# Patient Record
Sex: Female | Born: 1945
Health system: Southern US, Community
[De-identification: ages and names within clinical notes are randomized; demographics above are authoritative.]

## PROBLEM LIST (undated history)

## (undated) DIAGNOSIS — N279 Small kidney, unspecified: Secondary | ICD-10-CM

## (undated) DIAGNOSIS — G8929 Other chronic pain: Secondary | ICD-10-CM

## (undated) DIAGNOSIS — R51 Headache: Secondary | ICD-10-CM

## (undated) DIAGNOSIS — G629 Polyneuropathy, unspecified: Secondary | ICD-10-CM

## (undated) DIAGNOSIS — IMO0002 Reserved for concepts with insufficient information to code with codable children: Secondary | ICD-10-CM

## (undated) DIAGNOSIS — E785 Hyperlipidemia, unspecified: Secondary | ICD-10-CM

## (undated) DIAGNOSIS — K279 Peptic ulcer, site unspecified, unspecified as acute or chronic, without hemorrhage or perforation: Secondary | ICD-10-CM

## (undated) DIAGNOSIS — R110 Nausea: Secondary | ICD-10-CM

## (undated) DIAGNOSIS — R7989 Other specified abnormal findings of blood chemistry: Secondary | ICD-10-CM

## (undated) DIAGNOSIS — R945 Abnormal results of liver function studies: Secondary | ICD-10-CM

## (undated) DIAGNOSIS — J309 Allergic rhinitis, unspecified: Secondary | ICD-10-CM

## (undated) DIAGNOSIS — K589 Irritable bowel syndrome without diarrhea: Secondary | ICD-10-CM

## (undated) DIAGNOSIS — M351 Other overlap syndromes: Secondary | ICD-10-CM

## (undated) DIAGNOSIS — E559 Vitamin D deficiency, unspecified: Secondary | ICD-10-CM

## (undated) DIAGNOSIS — M1990 Unspecified osteoarthritis, unspecified site: Secondary | ICD-10-CM

## (undated) DIAGNOSIS — K219 Gastro-esophageal reflux disease without esophagitis: Secondary | ICD-10-CM

## (undated) DIAGNOSIS — E875 Hyperkalemia: Secondary | ICD-10-CM

## (undated) DIAGNOSIS — R519 Headache, unspecified: Secondary | ICD-10-CM

## (undated) DIAGNOSIS — F419 Anxiety disorder, unspecified: Secondary | ICD-10-CM

## (undated) DIAGNOSIS — I7300 Raynaud's syndrome without gangrene: Secondary | ICD-10-CM

## (undated) DIAGNOSIS — M329 Systemic lupus erythematosus, unspecified: Secondary | ICD-10-CM

## (undated) DIAGNOSIS — N189 Chronic kidney disease, unspecified: Secondary | ICD-10-CM

## (undated) DIAGNOSIS — I10 Essential (primary) hypertension: Secondary | ICD-10-CM

## (undated) DIAGNOSIS — D649 Anemia, unspecified: Secondary | ICD-10-CM

## (undated) DIAGNOSIS — M549 Dorsalgia, unspecified: Secondary | ICD-10-CM

## (undated) HISTORY — DX: Other chronic pain: G89.29

## (undated) HISTORY — DX: Reserved for concepts with insufficient information to code with codable children: IMO0002

## (undated) HISTORY — DX: Other specified abnormal findings of blood chemistry: R79.89

## (undated) HISTORY — DX: Anemia, unspecified: D64.9

## (undated) HISTORY — PX: TONSILLECTOMY: SUR1361

## (undated) HISTORY — PX: FOOT SURGERY: SHX648

## (undated) HISTORY — DX: Vitamin D deficiency, unspecified: E55.9

## (undated) HISTORY — DX: Dorsalgia, unspecified: M54.9

## (undated) HISTORY — DX: Irritable bowel syndrome, unspecified: K58.9

## (undated) HISTORY — DX: Raynaud's syndrome without gangrene: I73.00

## (undated) HISTORY — DX: Headache: R51

## (undated) HISTORY — DX: Small kidney, unspecified: N27.9

## (undated) HISTORY — DX: Systemic lupus erythematosus, unspecified: M32.9

## (undated) HISTORY — DX: Abnormal results of liver function studies: R94.5

## (undated) HISTORY — DX: Allergic rhinitis, unspecified: J30.9

## (undated) HISTORY — PX: WRIST GANGLION EXCISION: SUR520

## (undated) HISTORY — DX: Hypercalcemia: E83.52

## (undated) HISTORY — DX: Unspecified osteoarthritis, unspecified site: M19.90

## (undated) HISTORY — DX: Headache, unspecified: R51.9

## (undated) HISTORY — DX: Chronic kidney disease, unspecified: N18.9

## (undated) HISTORY — DX: Hyperkalemia: E87.5

## (undated) HISTORY — DX: Peptic ulcer, site unspecified, unspecified as acute or chronic, without hemorrhage or perforation: K27.9

## (undated) HISTORY — DX: Other overlap syndromes: M35.1

## (undated) HISTORY — PX: LIPOMA EXCISION: SHX5283

## (undated) HISTORY — DX: Gastro-esophageal reflux disease without esophagitis: K21.9

## (undated) HISTORY — DX: Hyperlipidemia, unspecified: E78.5

## (undated) HISTORY — DX: Essential (primary) hypertension: I10

## (undated) HISTORY — DX: Polyneuropathy, unspecified: G62.9

---

## 1970-01-06 HISTORY — PX: APPENDECTOMY: SHX54

## 1973-01-06 HISTORY — PX: ABDOMINAL HYSTERECTOMY: SHX81

## 1998-03-10 ENCOUNTER — Ambulatory Visit (HOSPITAL_COMMUNITY): Admission: RE | Admit: 1998-03-10 | Discharge: 1998-03-10 | Payer: Self-pay | Admitting: Family Medicine

## 1998-03-10 ENCOUNTER — Encounter: Payer: Self-pay | Admitting: Family Medicine

## 1999-01-01 ENCOUNTER — Ambulatory Visit (HOSPITAL_COMMUNITY): Admission: RE | Admit: 1999-01-01 | Discharge: 1999-01-01 | Payer: Self-pay | Admitting: *Deleted

## 1999-01-04 ENCOUNTER — Ambulatory Visit (HOSPITAL_COMMUNITY): Admission: RE | Admit: 1999-01-04 | Discharge: 1999-01-04 | Payer: Self-pay | Admitting: *Deleted

## 1999-01-24 ENCOUNTER — Encounter: Payer: Self-pay | Admitting: *Deleted

## 1999-01-24 ENCOUNTER — Ambulatory Visit (HOSPITAL_COMMUNITY): Admission: RE | Admit: 1999-01-24 | Discharge: 1999-01-24 | Payer: Self-pay | Admitting: *Deleted

## 1999-01-25 ENCOUNTER — Ambulatory Visit (HOSPITAL_COMMUNITY): Admission: RE | Admit: 1999-01-25 | Discharge: 1999-01-25 | Payer: Self-pay | Admitting: Obstetrics

## 1999-01-25 ENCOUNTER — Encounter: Payer: Self-pay | Admitting: *Deleted

## 1999-01-31 ENCOUNTER — Encounter: Payer: Self-pay | Admitting: *Deleted

## 1999-01-31 ENCOUNTER — Ambulatory Visit (HOSPITAL_COMMUNITY): Admission: RE | Admit: 1999-01-31 | Discharge: 1999-01-31 | Payer: Self-pay | Admitting: *Deleted

## 2000-02-27 ENCOUNTER — Other Ambulatory Visit: Admission: RE | Admit: 2000-02-27 | Discharge: 2000-02-27 | Payer: Self-pay | Admitting: Family Medicine

## 2000-03-09 ENCOUNTER — Ambulatory Visit (HOSPITAL_COMMUNITY): Admission: RE | Admit: 2000-03-09 | Discharge: 2000-03-09 | Payer: Self-pay | Admitting: Family Medicine

## 2000-03-09 ENCOUNTER — Encounter: Payer: Self-pay | Admitting: Family Medicine

## 2001-03-11 ENCOUNTER — Encounter: Admission: RE | Admit: 2001-03-11 | Discharge: 2001-03-11 | Payer: Self-pay | Admitting: *Deleted

## 2001-03-11 ENCOUNTER — Encounter: Payer: Self-pay | Admitting: *Deleted

## 2001-03-12 ENCOUNTER — Ambulatory Visit (HOSPITAL_COMMUNITY): Admission: RE | Admit: 2001-03-12 | Discharge: 2001-03-12 | Payer: Self-pay | Admitting: *Deleted

## 2001-03-12 ENCOUNTER — Encounter: Payer: Self-pay | Admitting: *Deleted

## 2001-04-22 ENCOUNTER — Ambulatory Visit (HOSPITAL_COMMUNITY): Admission: RE | Admit: 2001-04-22 | Discharge: 2001-04-22 | Payer: Self-pay | Admitting: *Deleted

## 2001-08-05 ENCOUNTER — Encounter: Payer: Self-pay | Admitting: Obstetrics

## 2001-08-05 ENCOUNTER — Ambulatory Visit (HOSPITAL_COMMUNITY): Admission: RE | Admit: 2001-08-05 | Discharge: 2001-08-05 | Payer: Self-pay | Admitting: Obstetrics

## 2002-03-01 ENCOUNTER — Ambulatory Visit (HOSPITAL_COMMUNITY): Admission: RE | Admit: 2002-03-01 | Discharge: 2002-03-01 | Payer: Self-pay | Admitting: *Deleted

## 2002-03-01 ENCOUNTER — Encounter: Payer: Self-pay | Admitting: *Deleted

## 2002-03-09 ENCOUNTER — Encounter: Payer: Self-pay | Admitting: Emergency Medicine

## 2002-03-09 ENCOUNTER — Emergency Department (HOSPITAL_COMMUNITY): Admission: EM | Admit: 2002-03-09 | Discharge: 2002-03-10 | Payer: Self-pay | Admitting: Emergency Medicine

## 2002-04-14 ENCOUNTER — Encounter: Payer: Self-pay | Admitting: *Deleted

## 2002-04-14 ENCOUNTER — Ambulatory Visit (HOSPITAL_COMMUNITY): Admission: RE | Admit: 2002-04-14 | Discharge: 2002-04-14 | Payer: Self-pay | Admitting: *Deleted

## 2002-05-20 ENCOUNTER — Encounter (HOSPITAL_COMMUNITY): Admission: RE | Admit: 2002-05-20 | Discharge: 2002-06-14 | Payer: Self-pay | Admitting: *Deleted

## 2003-01-06 ENCOUNTER — Ambulatory Visit (HOSPITAL_COMMUNITY): Admission: RE | Admit: 2003-01-06 | Discharge: 2003-01-06 | Payer: Self-pay

## 2003-02-28 ENCOUNTER — Ambulatory Visit (HOSPITAL_COMMUNITY): Admission: RE | Admit: 2003-02-28 | Discharge: 2003-02-28 | Payer: Self-pay | Admitting: *Deleted

## 2004-06-29 ENCOUNTER — Encounter: Admission: RE | Admit: 2004-06-29 | Discharge: 2004-06-29 | Payer: Self-pay | Admitting: Neurosurgery

## 2004-11-12 ENCOUNTER — Encounter: Admission: RE | Admit: 2004-11-12 | Discharge: 2004-11-12 | Payer: Self-pay | Admitting: Family Medicine

## 2004-12-06 ENCOUNTER — Ambulatory Visit (HOSPITAL_COMMUNITY): Admission: RE | Admit: 2004-12-06 | Discharge: 2004-12-06 | Payer: Self-pay | Admitting: Family Medicine

## 2005-09-15 ENCOUNTER — Encounter: Admission: RE | Admit: 2005-09-15 | Discharge: 2005-09-15 | Payer: Self-pay | Admitting: Gastroenterology

## 2006-03-26 ENCOUNTER — Ambulatory Visit (HOSPITAL_COMMUNITY): Admission: RE | Admit: 2006-03-26 | Discharge: 2006-03-26 | Payer: Self-pay | Admitting: Family Medicine

## 2006-09-14 ENCOUNTER — Encounter: Admission: RE | Admit: 2006-09-14 | Discharge: 2006-09-14 | Payer: Self-pay | Admitting: Gastroenterology

## 2006-09-17 ENCOUNTER — Encounter: Admission: RE | Admit: 2006-09-17 | Discharge: 2006-09-17 | Payer: Self-pay | Admitting: Family Medicine

## 2006-10-06 ENCOUNTER — Ambulatory Visit: Payer: Self-pay | Admitting: Oncology

## 2007-01-12 ENCOUNTER — Ambulatory Visit: Payer: Self-pay | Admitting: Gastroenterology

## 2007-01-14 ENCOUNTER — Ambulatory Visit (HOSPITAL_COMMUNITY): Admission: RE | Admit: 2007-01-14 | Discharge: 2007-01-14 | Payer: Self-pay | Admitting: Gastroenterology

## 2007-01-22 ENCOUNTER — Ambulatory Visit: Payer: Self-pay | Admitting: Gastroenterology

## 2007-01-22 LAB — CONVERTED CEMR LAB: Tissue Transglutaminase Ab, IgA: 0.5 units (ref <7)

## 2007-02-02 ENCOUNTER — Ambulatory Visit: Payer: Self-pay | Admitting: Gastroenterology

## 2007-03-23 ENCOUNTER — Ambulatory Visit: Payer: Self-pay | Admitting: Gastroenterology

## 2007-05-04 ENCOUNTER — Ambulatory Visit: Payer: Self-pay | Admitting: Gastroenterology

## 2007-06-15 ENCOUNTER — Encounter
Admission: RE | Admit: 2007-06-15 | Discharge: 2007-06-15 | Payer: Self-pay | Admitting: Physical Medicine and Rehabilitation

## 2007-08-13 ENCOUNTER — Encounter: Admission: RE | Admit: 2007-08-13 | Discharge: 2007-08-13 | Payer: Self-pay | Admitting: Internal Medicine

## 2007-11-10 ENCOUNTER — Ambulatory Visit (HOSPITAL_COMMUNITY): Admission: RE | Admit: 2007-11-10 | Discharge: 2007-11-10 | Payer: Self-pay | Admitting: Internal Medicine

## 2008-06-28 ENCOUNTER — Encounter: Admission: RE | Admit: 2008-06-28 | Discharge: 2008-09-26 | Payer: Self-pay | Admitting: Internal Medicine

## 2008-08-22 ENCOUNTER — Ambulatory Visit: Payer: Self-pay | Admitting: Gastroenterology

## 2008-08-22 DIAGNOSIS — R933 Abnormal findings on diagnostic imaging of other parts of digestive tract: Secondary | ICD-10-CM

## 2008-09-21 ENCOUNTER — Ambulatory Visit (HOSPITAL_COMMUNITY): Admission: RE | Admit: 2008-09-21 | Discharge: 2008-09-21 | Payer: Self-pay | Admitting: Gastroenterology

## 2008-09-21 ENCOUNTER — Ambulatory Visit: Payer: Self-pay | Admitting: Gastroenterology

## 2008-12-07 ENCOUNTER — Ambulatory Visit (HOSPITAL_COMMUNITY): Admission: RE | Admit: 2008-12-07 | Discharge: 2008-12-07 | Payer: Self-pay | Admitting: Obstetrics and Gynecology

## 2009-08-16 ENCOUNTER — Encounter: Admission: RE | Admit: 2009-08-16 | Discharge: 2009-08-16 | Payer: Self-pay | Admitting: Internal Medicine

## 2009-10-02 ENCOUNTER — Encounter: Admission: RE | Admit: 2009-10-02 | Discharge: 2009-10-02 | Payer: Self-pay | Admitting: Unknown Physician Specialty

## 2010-01-26 ENCOUNTER — Encounter: Payer: Self-pay | Admitting: General Practice

## 2010-01-27 ENCOUNTER — Encounter: Payer: Self-pay | Admitting: Gastroenterology

## 2010-03-07 ENCOUNTER — Other Ambulatory Visit (HOSPITAL_COMMUNITY)
Admission: RE | Admit: 2010-03-07 | Discharge: 2010-03-07 | Disposition: A | Discharge status: Home / Self Care | Payer: Medicare Other | Source: Ambulatory Visit | Attending: Internal Medicine | Admitting: Internal Medicine

## 2010-03-07 DIAGNOSIS — Z124 Encounter for screening for malignant neoplasm of cervix: Secondary | ICD-10-CM | POA: Insufficient documentation

## 2010-05-21 NOTE — Assessment & Plan Note (Signed)
[place] OFFICE NOTE   NAME:Holly Jimenez, MENZIES             MRN:          [id number]  DATE:01/12/2007                            DOB:          [birth year]    REFERRING PHYSICIAN:  Naima A. Charlesetta Garibaldi, M.D.   PRIMARY CARE PHYSICIAN:  L. Donnie Coffin, M.D.   REASON FOR REFERRAL:  Dr. Charlesetta Garibaldi asked me to evaluate [name] as a  second opinion for chronic right lower quadrant pain and weight loss.   HISTORY OF PRESENT ILLNESS:  [name] is a pleasant 65 year old woman  who has had at least one to two years of chronic right lower quadrant  discomfort and weight loss.  She believes she has lost 20 pounds in the  past year or so.  She does have alternating constipation with loose  stools and has been on Miral ax for that recently.  She takes the  Miralax on a p.r.n. basis.  She has not seen blood in her stool.  She  has a family history for colon cancer (her sister died of colon cancer),  and has had colorectal cancer screening through Dr. Howell Rucks  recently.  Specifically she had a colonoscopy with Dr. Katherina Mires in  2000.  This was a colonoscopy to the cecum.  He saw no polyps.  She had  an incomplete colonoscopy to 90 cm from the anal version 2007 by Dr.  Howell Rucks.  Following up this, he performed a virtual colonoscopy.  The radiologist that read this favored that lesions seen in the colon  actually represented untagged stool but they could not exclude small  polyps.  He followed that up with a double contrast barium enema and no  large fixed ilium were seen.  No polyps were noted.  She tells me she  had an endoscopy with Dr. Wynetta Emery three to four years ago and believes  that it was normal.   The right lower quadrant paten she has is an achy, constant discomfort  that is sometimes helped with moving her bowels but not regularly.  Eating does not seem to make it better or worse.  The pain can  last for  a week at a time.  She will take antispasmodics around that time and it  does not really seem to help.  She had lab tests done today by Dr.  Marveen Reeks and we will work to get those sent over for our review.  I do  not have any other lab tests.   REVIEW OF SYSTEMS:  An over 20 pound weight loss in the past year.  Otherwise essentially normal and is available on her nurse intake sheet.   PAST MEDICAL HISTORY:  Lupus, chronic renal insufficiency, recently  creatinines in the high 1's.  Hypertension, arthritis, anxiety, chronic  headaches, status post hysterectomy, status post appendectomy.   CURRENT MEDICATIONS:  Plaquenil, hyoscyamine, Accupril, Spironolactone,  prednisone, Megace, Prilosec, Zolpidem, Trazodone, cyclobenzaprine,  Skelaxin, Multi-vitamin, B12, vitamin D, low dose aspirin, iron, Zyrtec,  chlorpromazine as needed, alprazolam three times a day.   ALLERGIES:  SULFA ANTIBIOTICS.   Divorced, lives alone.  Nonsmoker.  Drinks  a glass of wine a week.   FAMILY HISTORY:  Sister with ovarian cancer and with diabetes.  Another  sister with colon cancer.   PHYSICAL EXAMINATION:  VITAL SIGNS:  5'2.  108 pounds.  CONSTITUTIONAL:  Chronically ill appearing.  NEUROLOGICAL:  Alert and oriented x3.  EYES:  Extraocular movements intact.  MOUTH:  Oropharynx moist, no lesions.  NECK:  Supple.  No lymphadenopathy.  CARDIOVASCULAR.  Heart regular rate and rhythm.  LUNGS:  Clear to auscultation bilaterally.  ABDOMEN:  Soft, nontender, nondistended.  Normal bowel sounds.  EXTREMITIES:  No lower extremity edema.  SKIN:  No rashes or lesions are visible on the extremities.   ASSESSMENT/PLAN:  65 year old woman with chronic right lower  quadrant discomfort and weight loss.  She has had two to three colonoscopies in the past seven to eight years,  virtual colonoscopy and a barium enema.  These were for colorectal  cancer screening purposes.  I do not think colonoscopy needs  to be  repeated at this point.  She does have constipation alternating with  diarrhea and I find fiber supplements are usually very effective at  alleviating that, so I have recommended that she begin taking Citrucel,  one scoop daily to see if that can regulate her.  Her right lower  quadrant pains are of unclear etiology.  Plaquenil can cause abdominal  pains and nausea (as numbers four and five side effects).  Perhaps it is  medication related.  She had a virtual colonoscopy less than a year and  a half ago and it did not show any abnormalities in her right lower  quadrant.  I will arrange for her to have a repeat CT scan; however,  given her renal insufficiency, it will have to be without IV contrast so  it will be of somewhat limited value.  That being said, big lesions such  as large neoplastic lesions should still be able to be seen by non-IV  contrast scan.  We will get labs sent over from Dr. Marveen Reeks who  apparently drew some blood work on her today.  I may need to draw  further labs but I will wait to see exactly what she has had recently  before I do more blood work on her.  Another possibility for her weight  loss is her mild uremia.  She has renal insufficiency and perhaps this  is causing her some mild nausea and anorexia.     Milus Banister, MD  Electronically Signed    DPJ/MedQ  DD: 01/12/2007  DT: 01/12/2007  Job #: [id number]   cc:   Naima A. Charlesetta Garibaldi, M.D.  Anson Oregon, M.D.  Donavan Burnet, M.D.

## 2010-05-21 NOTE — Assessment & Plan Note (Signed)
[place] OFFICE NOTE   NAME:Holly Jimenez, Holly Jimenez             MRN:          [id number]  DATE:05/04/2007                            DOB:          18-Aug-1945    GI PROBLEM LIST:  1. Chronic constipation.  Right lower quadrant pain improved when she      moves her bowels. Colonoscopy, Vella Kohler, 2000.  No polyps seen.      Colonoscopy, incomplete, 2007, Dr. Wynetta Emery, followed by a virtual      colonoscopy in 2007, followed by a barium enema in 2007.  These      were all essentially normal.  2. Mild to moderate gastritis, EGD January 2009.  CLO testing      negative.   INTERVAL HISTORY:  I saw Holly Jimenez about a month and a half ago.  Since  then she has been on a regimen of one scoop of Citrucel a day, and one  scoop of MiraLax a day.  On this regimen she says she moves her bowels  daily, and only every 8-10 days does she need to take a laxative to help  with some mild constipation.  Since on this regimen with her bowels  moving more regularly, her abdominal discomforts have completely  resolved.   CURRENT MEDICATIONS:  1. Hydroxychloroquine.  2. Hyoscyamine.  3. Lisinopril.  4. Spironolactone.  5. Prednisone.  6. Megace.  7. Prilosec.  8. Zolpidem.  9. Trazodone.  10.Vitamin B-12.  11.Calcium plus D.  12.Bayer low-dose aspirin.  13.Ferrous gluconate twice a day.  14.Alprazolam three times a day.  15.Vitamin B-6 and a multivitamin.   PHYSICAL EXAMINATION:  Weight 112 pounds (which is up 3 pounds since her  last visit), blood pressure 108/58, pulse 80.  CONSTITUTIONAL:  Generally well appearing.  ABDOMEN:  Soft, nontender, nondistended.  Normal bowel sounds.   ASSESSMENT AND PLAN:  A 65 year old woman with chronic constipation and  intermittent right lower quadrant pains.  Both of these have been  resolved with continued MiraLax and fiber supplementation.   She has definitely noticed a big  improvement in her constipation, as  well as her abdominal discomfort; since she has begun taking a regimen  of one scoop of Citrucel a day and one scoop  of MiraLax a day.  She will continue this indefinitely.  She knows to  get in touch should she have any further bowel issues.     Milus Banister, MD  Electronically Signed    DPJ/MedQ  DD: 05/04/2007  DT: 05/04/2007  Job #: [id number]   cc:   L. Donnie Coffin, M.D.

## 2010-05-21 NOTE — Assessment & Plan Note (Signed)
[place] OFFICE NOTE   NAME:Holly Jimenez, Holly Jimenez             MRN:          [id number]  DATE:03/23/2007                            DOB:          11/07/1945    GI PROBLEM LIST:  1. Chronic constipation.  Right lower quadrant pain improved when she      moves her bowels. Colonoscopy, Vella Kohler, 2000.  No polyps seen.      Colonoscopy, incomplete, 2007, Dr. Wynetta Emery, followed by a virtual      colonoscopy in 2007, followed by a barium enema in 2007.  These      were all essentially normal.   NEW GI PROBLEM:  Mild to moderate gastritis, EGD January 2009.  CLO  testing negative.   INTERVAL HISTORY:  I last saw Holly Jimenez at the time of her upper  endoscopy about 6 weeks ago.  At that point she had already noticed a  definite improvement in her intermittent right lower quadrant pains and  her constipation while taking fiber supplements on a daily basis.  She  said the effect of the fiber seemed to wane a bit and she had to double  up on the dose of the Citrucel, as well as reaching for laxatives on a  p.r.n. basis now.  She still does have intermittent right lower quadrant  pains that have are definitely improved when she has a good bowel  movement.  I did not mention above, but I did a CT scan on her in  January 2009 and this showed a generous amount of stool in her colon,  but was otherwise essentially normal.  This was the abdomen and pelvis  without IV contrast, but was with oral contrast.   CURRENT MEDICINES:  Vitamin B12, vitamin D, calcium, low dose aspirin,  iron twice a day, Zyrtec, chlorpromazine, alprazolam, Elidel cream,  Lidoderm patches, hydrocodone as needed for pain.  Promethazine p.r.n.,  Plaquenil, hydrocyamine, Accupril, spironolactone, prednisone, Megace,  Prilosec, zolpidem, trazodone, Flexeril, Skelaxin.   PHYSICAL EXAMINATION:  Weight 109 pounds, up 1 pound since her last  visit.   Blood pressure 144/78.  Pulse 88.  CONSTITUTIONAL:  Generally well appearing.  ABDOMEN:  Soft, nontender, nondistended overall.  EXTREMITIES:  No lower extremity edema.   ASSESSMENT AND PLAN:  A 65 year old woman with chronic constipation,  intermittent right lower quadrant pains.   She has definitely noticed an improvement in her constipation and right  lower quadrant pains with fiber supplement, although that effect seems  to have waned a bit.  She is currently taking 2 scoops of Citrucel on a  daily basis, as well as rescue laxative.  Recommended she get back on  MiraLAX taking 1 scoop a day and back down to 1 scoop of Citrucel a day.  She will return to see me in 4-6 weeks or sooner if needed.   I did not notice before she left that she takes narcotic pain medicines,  and that could certainly be causing some of her constipation.  I will  discuss this with her at her next office visit.     Milus Banister, MD  Electronically Signed    DPJ/MedQ  DD: 03/23/2007  DT: 03/23/2007  Job #: [id number]   cc:   L. Donnie Coffin, M.D.

## 2010-05-24 NOTE — Op Note (Signed)
[facility]  Patient:    Holly Jimenez              MRN: [id number] Proc. Date: 01/04/99 Adm. Date:  [id number] Attending:  Woody Seller CC:         Elyn Peers, M.D.                           Operative Report  REFERRING PHYSICIAN:  Elyn Peers, M.D.  PREOPERATIVE DIAGNOSIS:  Family history of colon cancer.  POSTOPERATIVE DIAGNOSIS:  Normal colonoscopic examination to the cecum.  OPERATION:  Colonoscopy.  SURGEON:  Woody Seller., M.D.  PREMEDICATION:  Demerol 100 mg IV and Versed 10 mg IV over a 20 minute period of time.  INSTRUMENTS:  The Olympus video colonoscope.  INDICATIONS:  This pleasant 65 year old female was referred to me for an evaluation because of a family history of colon cancer at this time.  She has not had any major problems at this time.  No history of any weight loss at this time that was present.  She has not been evaluated for at least three years from the last examination at this time.  PHYSICAL EXAMINATION:  VITAL SIGNS:  Stable.  HEENT:  Anicteric.  NECK:  Supple.  LUNGS:  Clear.  HEART:  Regular rate and rhythm without heaves, thrills, murmurs, or gallops.  ABDOMEN:  Soft.  No tenderness.  No hepatosplenomegaly appreciated.  EXTREMITIES:  Unremarkable.  PLAN:  I will proceed with the repeat evaluation at this time to make sure no evidence of cancer exist in this patient at this point.  Depending upon the results, we will determine the course of therapy.  INFORMED CONSENT:  The patient was advised of the procedure, the indications, and the risks involved.  The patient has agreed to have the procedure performed at his time.  DESCRIPTION OF PROCEDURE:  The patient was brought in the endoscopy unit where n IV for IV sedating medication was started.  A monitor was placed on the patient to monitor the patients vital signs and oxygen saturation.  Nasal oxygen at two liters per  minute was used, and after adequate sedation was performed, the procedure was begun.  The instrument was advanced to the patient lying in the left lateral position approximately 180 cm proximal to the colon that was present; however, with retraction of the instrument and manipulation, the instrument reached the cecum at approximately 80 cm that was noted.  This was confirmed by visualization of the  ileocecal valve as well as palpation that was noted.  There appeared to be no gross abnormalities such as masses, polyps, or stricture lesions that were appreciated.  The vascular pattern appeared to be within normal limits throughout the entire colon.  The mucosal pattern showed no evidence of ny granular changes or diverticular changes at this time.  There was no evidence of any internal or external hemorrhoids upon exiting from the area.  The patient tolerated the procedure well without any complications noted.  The level of difficulty appeared to be approximately 7 out of 10 that was noted. There was increased tortuosity in the advancing process at this time and in reaching the hepatic flexure to the ascending colon difficulty in manipulating he instrument at times led to increased difficulty with advancing the instrument that was noted.  RECOMMENDATIONS:  Would recommend the attempt with a pediatric colonoscope at the next evaluation of the area  if available.  Conservative management at this time.  Would recommend repeating the procedure n approximately three to five years and refollow up in the office as needed. DD:  01/04/99 TD:  01/05/99 Job: [id number]

## 2011-04-28 ENCOUNTER — Other Ambulatory Visit (HOSPITAL_COMMUNITY): Payer: Self-pay | Admitting: Internal Medicine

## 2011-04-28 DIAGNOSIS — Z1231 Encounter for screening mammogram for malignant neoplasm of breast: Secondary | ICD-10-CM

## 2011-05-22 ENCOUNTER — Ambulatory Visit (HOSPITAL_COMMUNITY)
Admission: RE | Admit: 2011-05-22 | Discharge: 2011-05-22 | Disposition: A | Discharge status: Home / Self Care | Payer: Medicare Other | Source: Ambulatory Visit | Attending: Internal Medicine | Admitting: Internal Medicine

## 2011-05-22 DIAGNOSIS — Z1231 Encounter for screening mammogram for malignant neoplasm of breast: Secondary | ICD-10-CM | POA: Insufficient documentation

## 2011-11-11 ENCOUNTER — Encounter: Payer: Self-pay | Admitting: Gastroenterology

## 2011-11-11 ENCOUNTER — Ambulatory Visit (INDEPENDENT_AMBULATORY_CARE_PROVIDER_SITE_OTHER): Payer: Medicare Other | Admitting: Gastroenterology

## 2011-11-11 VITALS — BP 120/52 | HR 76 | Ht 61.0 in | Wt 127.1 lb

## 2011-11-11 DIAGNOSIS — R634 Abnormal weight loss: Secondary | ICD-10-CM

## 2011-11-11 DIAGNOSIS — R110 Nausea: Secondary | ICD-10-CM

## 2011-11-11 NOTE — Progress Notes (Signed)
Review of pertinent gastrointestinal problems: 1. Chronic constipation. Right lower quadrant pain improved when she moves her bowels. Colonoscopy, Vella Kohler, 2000. No polyps seen. Colonoscopy, incomplete, 2007, Dr. Wynetta Emery, followed by a virtual colonoscopy in 2007, followed by a barium enema in 2007. These were all essentially normal. The virtual colonoscopy suggested some possible small polyps. Dr. Wynetta Emery was planning to repeat that examination at one year interval.  Colonsocopy 09/2008 (Marg Macmaster, complete); normal colonoscopy; recommended recall at 10 year interval 2. Mild to moderate gastritis, EGD January 2009. CLO testing negative.    HPI: This is a    very pleasant 66 year old woman whom I last saw 3 or 4 years ago.  Whom I last saw 3-4 years ago  Has been having excessive nausea and abdominal pains since April.   dexilant seemed to help, currently taking omeprazole.  She takes this twice a day.  This seems to be helping as well.  The pain is gone currently, last time she had it was a week ago.  Just left with nausea now.  She had plain film by PCP, tells me it was normal.  LAbs 09/2011 normal lfts, Cr 2.4.  Hb 10.9 normocytic  She feels she has lost 5-6 pounds in 1 month, unitentionally.  She was   No NSAIDs.  Was recently started on hydrocodone, taking 4 times a day.   Review of systems: Pertinent positive and negative review of systems were noted in the above HPI section. Complete review of systems was performed and was otherwise normal.    Past Medical History  Diagnosis Date  . Anemia   . Chronic kidney disease, unspecified   . Mixed connective tissue disease   . Raynaud's disease   . Vitamin D deficiency disease   . Hypertension   . Hyperlipidemia   . Small kidney     left  . GERD (gastroesophageal reflux disease)   . Chronic back pain   . Abnormal LFTs   . Hypercalcemia   . Hyperkalemia   . Lupus   . Peptic ulcer disease   . Arthritis   . Chronic headaches     . IBS (irritable bowel syndrome)   . Allergic rhinitis   . Neuropathy   . DDD (degenerative disc disease)     Past Surgical History  Procedure Date  . Appendectomy 1972  . Abdominal hysterectomy 1975  . Tonsillectomy   . Wrist ganglion excision     left  . Lipoma excision     back    Current Outpatient Prescriptions  Medication Sig Dispense Refill  . ALPRAZolam (XANAX) 0.25 MG tablet Take 0.25 mg by mouth 3 (three) times daily as needed.       Marland Kitchen aspirin 81 MG tablet Take 81 mg by mouth daily.      . cetirizine (ZYRTEC) 5 MG tablet Take 5 mg by mouth daily.      . cholecalciferol (VITAMIN D) 1000 UNITS tablet Take 1,000 Units by mouth daily.      Marland Kitchen dexlansoprazole (DEXILANT) 60 MG capsule Take 60 mg by mouth daily.      . ferrous gluconate (FERGON) 240 (27 FE) MG tablet Take 240 mg by mouth 2 (two) times daily.      Marland Kitchen HYDROcodone-acetaminophen (VICODIN) 5-500 MG per tablet Take 1 tablet by mouth every 6 (six) hours as needed.      . hydroxychloroquine (PLAQUENIL) 200 MG tablet Take 200 mg by mouth 2 (two) times daily.       Marland Kitchen  ketoconazole (NIZORAL) 2 % cream Apply 1 application topically as needed.       Marland Kitchen l-methylfolate-B6-B12 (METANX) 3-35-2 MG TABS Take 1 tablet by mouth daily.      . metaxalone (SKELAXIN) 800 MG tablet Take 800 mg by mouth 2 (two) times daily.      . methocarbamol (ROBAXIN) 500 MG tablet Take 500 mg by mouth every 6 (six) hours.       . Multiple Vitamin (MULTIVITAMIN) capsule Take 1 capsule by mouth daily.      Marland Kitchen olmesartan (BENICAR) 20 MG tablet Take 10 mg by mouth 2 (two) times daily.       . predniSONE (DELTASONE) 5 MG tablet Take 5 mg by mouth daily.      . pregabalin (LYRICA) 75 MG capsule Take 75 mg by mouth 2 (two) times daily.      . promethazine (PHENERGAN) 25 MG tablet Take 25 mg by mouth every 6 (six) hours as needed.      . pyridOXINE (VITAMIN B-6) 100 MG tablet Take 100 mg by mouth daily.      Marland Kitchen spironolactone (ALDACTONE) 50 MG tablet Take 25 mg  by mouth daily.       . traZODone (DESYREL) 50 MG tablet Take 50 mg by mouth at bedtime.      . vitamin B-12 (CYANOCOBALAMIN) 100 MCG tablet Take 100 mcg by mouth daily.       Marland Kitchen zolpidem (AMBIEN) 10 MG tablet Take 10 mg by mouth at bedtime as needed.        Allergies as of 11/11/2011 - Review Complete 11/11/2011  Allergen Reaction Noted  . Sulfonamide derivatives      Family History  Problem Relation Age of Onset  . Ovarian cancer Sister   . Colon cancer Sister   . Heart disease Paternal Grandfather   . Heart disease Maternal Grandfather   . Kidney disease Brother   . Kidney disease Mother   . Kidney disease Maternal Grandmother   . Diabetes Paternal Grandmother     History   Social History  . Marital Status: Divorced    Spouse Name: N/A    Number of Children: 1  . Years of Education: N/A   Occupational History  . retired [employer]   Social History Main Topics  . Smoking status: Former Smoker    Types: Cigarettes  . Smokeless tobacco: Never Used  . Alcohol Use: No  . Drug Use: Yes     Comment: marijuana for nausea  . Sexually Active: Not on file   Other Topics Concern  . Not on file   Social History Narrative  . No narrative on file       Physical Exam: Ht 5\' 1"  (1.549 m)  Wt 127 lb 2 oz (57.664 kg)  BMI 24.02 kg/m2 Constitutional: generally well-appearing Psychiatric: alert and oriented x3 Eyes: extraocular movements intact Mouth: oral pharynx moist, no lesions Neck: supple no lymphadenopathy Cardiovascular: heart regular rate and rhythm Lungs: clear to auscultation bilaterally Abdomen: soft, nontender, nondistended, no obvious ascites, no peritoneal signs, normal bowel sounds Extremities: no lower extremity edema bilaterally Skin: no lesions on visible extremities    Assessment and plan: 66 y.o. female with  recent epigastric pain, persistent nausea, unintentional weight loss over the past month.  I think we should proceed with  upper endoscopy given her weight loss, her persistent nausea. Proton pump and inhibitor seems to have helped her abdominal discomforts but she is still losing weight and having persistent  constant nausea

## 2011-11-11 NOTE — Patient Instructions (Addendum)
You will be set up for an upper endoscopy (Mac sedation at [facility] or [place]) for nausea, weight loss, abd pains. A copy of this information will be made available to Dr. Jani Gravel. Stay on omeprazole twice daily for now.

## 2011-11-18 ENCOUNTER — Encounter: Payer: Self-pay | Admitting: Gastroenterology

## 2011-11-18 ENCOUNTER — Ambulatory Visit (AMBULATORY_SURGERY_CENTER): Payer: Medicare Other | Admitting: Gastroenterology

## 2011-11-18 VITALS — BP 139/85 | HR 68 | Temp 98.2°F | Resp 15 | Ht 61.0 in | Wt 127.0 lb

## 2011-11-18 DIAGNOSIS — K2990 Gastroduodenitis, unspecified, without bleeding: Secondary | ICD-10-CM

## 2011-11-18 DIAGNOSIS — K2960 Other gastritis without bleeding: Secondary | ICD-10-CM

## 2011-11-18 DIAGNOSIS — R634 Abnormal weight loss: Secondary | ICD-10-CM

## 2011-11-18 DIAGNOSIS — K2970 Gastritis, unspecified, without bleeding: Secondary | ICD-10-CM

## 2011-11-18 DIAGNOSIS — R110 Nausea: Secondary | ICD-10-CM

## 2011-11-18 MED ORDER — SODIUM CHLORIDE 0.9 % IV SOLN: 500.0000 mL | INTRAVENOUS | Status: DC

## 2011-11-18 MED ORDER — PROMETHAZINE HCL 12.5 MG RE SUPP: 12.5000 mg | Freq: Four times a day (QID) | RECTAL | Status: DC | PRN

## 2011-11-18 NOTE — Progress Notes (Signed)
Patient did not experience any of the following events: a burn prior to discharge; a fall within the facility; wrong site/side/patient/procedure/implant event; or a hospital transfer or hospital admission upon discharge from the facility. (G8907) Patient did not have preoperative order for IV antibiotic SSI prophylaxis. (G8918)  

## 2011-11-18 NOTE — Progress Notes (Signed)
1130 a/o x3 pleased report to [facility]

## 2011-11-18 NOTE — Op Note (Signed)
[facility]  [address] [place], [id number]   ENDOSCOPY PROCEDURE REPORT  PATIENT: Tangla, Morosky.  MR#: [id number] BIRTHDATE: 09-Jun-1945 , 66  yrs. old GENDER: Female ENDOSCOPIST: Milus Banister, MD PROCEDURE DATE:  11/18/2011 PROCEDURE:  EGD w/ biopsy ASA CLASS:     Class III INDICATIONS:  nausea, weight loss. MEDICATIONS: Propofol (Diprivan) 120 mg IV TOPICAL ANESTHETIC: Cetacaine Spray  DESCRIPTION OF PROCEDURE: After the risks benefits and alternatives of the procedure were thoroughly explained, informed consent was obtained.  The [facility] GIF-H180 [id number] endoscope was introduced through the mouth and advanced to the second portion of the duodenum. Without limitations.  The instrument was slowly withdrawn as the mucosa was fully examined.    There was mild to moderate pan-gastritis, maily posterior wall distal stomach.  This was biopsied and sent to pathology.  The examination was otherwise normal.  Retroflexed views revealed no abnormalities.     The scope was then withdrawn from the patient and the procedure completed. COMPLICATIONS: There were no complications.  ENDOSCOPIC IMPRESSION: There was mild to moderate pan-gastritis, maily posterior wall distal stomach.  This was biopsied and sent to pathology. The examination was otherwise normal.  RECOMMENDATIONS: Await final pathology.  Would consider further imaging if these biopsies not helpful.    eSigned:  Milus Banister, MD 11/18/2011 11:23 AM   [id number] Maudie Mercury, MD

## 2011-11-18 NOTE — Patient Instructions (Addendum)
YOU HAD AN ENDOSCOPIC PROCEDURE TODAY AT THE [facility]: Refer to the procedure report that was given to you for any specific questions about what was found during the examination.  If the procedure report does not answer your questions, please call your gastroenterologist to clarify.  If you requested that your care partner not be given the details of your procedure findings, then the procedure report has been included in a sealed envelope for you to review at your convenience later.  YOU SHOULD EXPECT: Some feelings of bloating in the abdomen. Passage of more gas than usual.  Walking can help get rid of the air that was put into your GI tract during the procedure and reduce the bloating. If you had a lower endoscopy (such as a colonoscopy or flexible sigmoidoscopy) you may notice spotting of blood in your stool or on the toilet paper. If you underwent a bowel prep for your procedure, then you may not have a normal bowel movement for a few days.  DIET: Your first meal following the procedure should be a light meal and then it is ok to progress to your normal diet.  A half-sandwich or bowl of soup is an example of a good first meal.  Heavy or fried foods are harder to digest and may make you feel nauseous or bloated.  Likewise meals heavy in dairy and vegetables can cause extra gas to form and this can also increase the bloating.  Drink plenty of fluids but you should avoid alcoholic beverages for 24 hours.  ACTIVITY: Your care partner should take you home directly after the procedure.  You should plan to take it easy, moving slowly for the rest of the day.  You can resume normal activity the day after the procedure however you should NOT DRIVE or use heavy machinery for 24 hours (because of the sedation medicines used during the test).    SYMPTOMS TO REPORT IMMEDIATELY: A gastroenterologist can be reached at any hour.  During normal business hours, 8:30 AM to 5:00 PM Monday through Friday,  call [phone].  After hours and on weekends, please call the GI answering service at [phone] who will take a message and have the physician on call contact you.    Following upper endoscopy (EGD)  Vomiting of blood or coffee ground material  New chest pain or pain under the shoulder blades  Painful or persistently difficult swallowing  New shortness of breath  Fever of 100F or higher  Black, tarry-looking stools  FOLLOW UP: If any biopsies were taken you will be contacted by phone or by letter within the next 1-3 weeks.  Call your gastroenterologist if you have not heard about the biopsies in 3 weeks.  Our staff will call the home number listed on your records the next business day following your procedure to check on you and address any questions or concerns that you may have at that time regarding the information given to you following your procedure. This is a courtesy call and so if there is no answer at the home number and we have not heard from you through the emergency physician on call, we will assume that you have returned to your regular daily activities without incident.  SIGNATURES/CONFIDENTIALITY: You and/or your care partner have signed paperwork which will be entered into your electronic medical record.  These signatures attest to the fact that that the information above on your After Visit Summary has been reviewed and is understood.  Full   responsibility of the confidentiality of this discharge information lies with you and/or your care-partner.   Resume medications. Information given on gastritis with discharge instructions. 

## 2011-11-19 ENCOUNTER — Telehealth: Payer: Self-pay | Admitting: *Deleted

## 2011-11-19 NOTE — Telephone Encounter (Signed)
  Follow up Call-  Call back number 11/18/2011  Post procedure Call Back phone  # [phone]  Permission to leave phone message Yes     Patient questions:  Do you have a fever, pain , or abdominal swelling? no Pain Score  0 *  Have you tolerated food without any problems? yes  Have you been able to return to your normal activities? yes  Do you have any questions about your discharge instructions: Diet   no Medications  no Follow up visit  no  Do you have questions or concerns about your Care? no  Actions: * If pain score is 4 or above: No action needed, pain <4.

## 2011-12-02 ENCOUNTER — Other Ambulatory Visit: Payer: Self-pay | Admitting: Internal Medicine

## 2011-12-02 DIAGNOSIS — R634 Abnormal weight loss: Secondary | ICD-10-CM

## 2011-12-10 ENCOUNTER — Ambulatory Visit
Admission: RE | Admit: 2011-12-10 | Discharge: 2011-12-10 | Disposition: A | Discharge status: Home / Self Care | Payer: Medicare Other | Source: Ambulatory Visit | Attending: Internal Medicine | Admitting: Internal Medicine

## 2011-12-10 DIAGNOSIS — R634 Abnormal weight loss: Secondary | ICD-10-CM

## 2011-12-19 ENCOUNTER — Telehealth: Payer: Self-pay | Admitting: Gastroenterology

## 2011-12-19 NOTE — Telephone Encounter (Signed)
Pt has been notified and will call back to make office appt

## 2011-12-19 NOTE — Telephone Encounter (Signed)
CT 2 weeks ago, result was not sent to my inbox.  No clear cause of her abd pains or weight loss. Please call her.  ROV in 5-6 weeks to discuss symptoms

## 2011-12-19 NOTE — Telephone Encounter (Signed)
Left message on machine to call back  

## 2012-02-21 ENCOUNTER — Other Ambulatory Visit: Payer: Self-pay

## 2012-08-11 ENCOUNTER — Other Ambulatory Visit: Payer: Self-pay

## 2012-11-11 ENCOUNTER — Other Ambulatory Visit: Payer: Self-pay

## 2013-04-05 ENCOUNTER — Other Ambulatory Visit: Payer: Self-pay | Admitting: Internal Medicine

## 2013-04-05 DIAGNOSIS — R7989 Other specified abnormal findings of blood chemistry: Secondary | ICD-10-CM

## 2013-04-05 DIAGNOSIS — R945 Abnormal results of liver function studies: Principal | ICD-10-CM

## 2013-04-11 ENCOUNTER — Other Ambulatory Visit: Payer: Medicare Other

## 2013-04-14 ENCOUNTER — Ambulatory Visit
Admission: RE | Admit: 2013-04-14 | Discharge: 2013-04-14 | Disposition: A | Discharge status: Home / Self Care | Payer: Medicare Other | Source: Ambulatory Visit | Attending: Internal Medicine | Admitting: Internal Medicine

## 2013-04-14 DIAGNOSIS — R7989 Other specified abnormal findings of blood chemistry: Secondary | ICD-10-CM

## 2013-04-14 DIAGNOSIS — R945 Abnormal results of liver function studies: Principal | ICD-10-CM

## 2013-05-02 ENCOUNTER — Other Ambulatory Visit: Payer: Self-pay | Admitting: Internal Medicine

## 2013-05-02 DIAGNOSIS — M25551 Pain in right hip: Secondary | ICD-10-CM

## 2013-05-08 ENCOUNTER — Ambulatory Visit
Admission: RE | Admit: 2013-05-08 | Discharge: 2013-05-08 | Disposition: A | Discharge status: Home / Self Care | Payer: Medicare Other | Source: Ambulatory Visit | Attending: Internal Medicine | Admitting: Internal Medicine

## 2013-05-08 DIAGNOSIS — M25551 Pain in right hip: Secondary | ICD-10-CM

## 2013-10-12 ENCOUNTER — Emergency Department (HOSPITAL_COMMUNITY)
Admission: EM | Admit: 2013-10-12 | Discharge: 2013-10-13 | Disposition: A | Discharge status: Home / Self Care | Payer: Medicare Other | Attending: Emergency Medicine | Admitting: Emergency Medicine

## 2013-10-12 ENCOUNTER — Emergency Department (HOSPITAL_COMMUNITY): Payer: Medicare Other

## 2013-10-12 ENCOUNTER — Encounter (HOSPITAL_COMMUNITY): Payer: Self-pay | Admitting: Emergency Medicine

## 2013-10-12 DIAGNOSIS — S299XXA Unspecified injury of thorax, initial encounter: Secondary | ICD-10-CM | POA: Insufficient documentation

## 2013-10-12 DIAGNOSIS — Y9389 Activity, other specified: Secondary | ICD-10-CM | POA: Diagnosis not present

## 2013-10-12 DIAGNOSIS — M546 Pain in thoracic spine: Secondary | ICD-10-CM

## 2013-10-12 DIAGNOSIS — S24109A Unspecified injury at unspecified level of thoracic spinal cord, initial encounter: Secondary | ICD-10-CM | POA: Diagnosis present

## 2013-10-12 DIAGNOSIS — M329 Systemic lupus erythematosus, unspecified: Secondary | ICD-10-CM | POA: Insufficient documentation

## 2013-10-12 DIAGNOSIS — Z7982 Long term (current) use of aspirin: Secondary | ICD-10-CM | POA: Insufficient documentation

## 2013-10-12 DIAGNOSIS — Z79899 Other long term (current) drug therapy: Secondary | ICD-10-CM | POA: Diagnosis not present

## 2013-10-12 DIAGNOSIS — K219 Gastro-esophageal reflux disease without esophagitis: Secondary | ICD-10-CM | POA: Insufficient documentation

## 2013-10-12 DIAGNOSIS — E559 Vitamin D deficiency, unspecified: Secondary | ICD-10-CM | POA: Insufficient documentation

## 2013-10-12 DIAGNOSIS — G8929 Other chronic pain: Secondary | ICD-10-CM | POA: Diagnosis not present

## 2013-10-12 DIAGNOSIS — W19XXXA Unspecified fall, initial encounter: Secondary | ICD-10-CM

## 2013-10-12 DIAGNOSIS — N189 Chronic kidney disease, unspecified: Secondary | ICD-10-CM | POA: Insufficient documentation

## 2013-10-12 DIAGNOSIS — Z7952 Long term (current) use of systemic steroids: Secondary | ICD-10-CM | POA: Insufficient documentation

## 2013-10-12 DIAGNOSIS — S22078A Other fracture of T9-T10 vertebra, initial encounter for closed fracture: Secondary | ICD-10-CM | POA: Insufficient documentation

## 2013-10-12 DIAGNOSIS — I129 Hypertensive chronic kidney disease with stage 1 through stage 4 chronic kidney disease, or unspecified chronic kidney disease: Secondary | ICD-10-CM | POA: Diagnosis not present

## 2013-10-12 DIAGNOSIS — W010XXA Fall on same level from slipping, tripping and stumbling without subsequent striking against object, initial encounter: Secondary | ICD-10-CM | POA: Diagnosis not present

## 2013-10-12 DIAGNOSIS — R0781 Pleurodynia: Secondary | ICD-10-CM

## 2013-10-12 DIAGNOSIS — Z8709 Personal history of other diseases of the respiratory system: Secondary | ICD-10-CM | POA: Diagnosis not present

## 2013-10-12 DIAGNOSIS — G629 Polyneuropathy, unspecified: Secondary | ICD-10-CM | POA: Diagnosis not present

## 2013-10-12 DIAGNOSIS — Y92009 Unspecified place in unspecified non-institutional (private) residence as the place of occurrence of the external cause: Secondary | ICD-10-CM | POA: Insufficient documentation

## 2013-10-12 DIAGNOSIS — E785 Hyperlipidemia, unspecified: Secondary | ICD-10-CM | POA: Diagnosis not present

## 2013-10-12 DIAGNOSIS — D649 Anemia, unspecified: Secondary | ICD-10-CM | POA: Diagnosis not present

## 2013-10-12 DIAGNOSIS — S22000A Wedge compression fracture of unspecified thoracic vertebra, initial encounter for closed fracture: Secondary | ICD-10-CM

## 2013-10-12 DIAGNOSIS — M1990 Unspecified osteoarthritis, unspecified site: Secondary | ICD-10-CM | POA: Insufficient documentation

## 2013-10-12 DIAGNOSIS — Z87891 Personal history of nicotine dependence: Secondary | ICD-10-CM | POA: Insufficient documentation

## 2013-10-12 MED ORDER — HYDROMORPHONE HCL 1 MG/ML IJ SOLN
0.5000 mg | Freq: Once | INTRAMUSCULAR | Status: AC
  Administered 2013-10-12: 0.5 mg via INTRAMUSCULAR
  Filled 2013-10-12: qty 1

## 2013-10-12 MED ORDER — OXYCODONE-ACETAMINOPHEN 5-325 MG PO TABS: 2.0000 | ORAL_TABLET | ORAL | Status: DC | PRN

## 2013-10-12 MED ORDER — OXYCODONE-ACETAMINOPHEN 5-325 MG PO TABS: 2.0000 | ORAL_TABLET | Freq: Once | ORAL | Status: DC

## 2013-10-12 NOTE — ED Notes (Signed)
Pt to CT

## 2013-10-12 NOTE — ED Notes (Signed)
Pt alert and oriented x4. Respirations even and unlabored, bilateral symmetrical rise and fall of chest. Skin warm and dry. In no acute distress. Denies needs.   

## 2013-10-12 NOTE — ED Notes (Addendum)
Pt refused bedpan, ambulated to bathroom with steady gait

## 2013-10-12 NOTE — ED Notes (Signed)
Pt reports she fell on slippery floor yesterday, fell straight backwards, landed on her back. Reports she might have hit her head but denies LOC. Pt a&ox4. Pt complaining of mid back pain 10/10. Pt has pain patches applied bil flank areas.

## 2013-10-12 NOTE — ED Provider Notes (Signed)
CSN: [id number]     Arrival date & time 10/12/13  1622 History   First MD Initiated Contact with Patient 10/12/13 1653     Chief Complaint  Patient presents with  . Fall  . Back Pain     (Consider location/radiation/quality/duration/timing/severity/associated sxs/prior Treatment) HPI Holly Jimenez is a 68 y.o. female with history of lupus and takes prednisone daily comes in for evaluation of back pain after a fall today. Patient states she was standing in her dining room this afternoon when she slipped on furniture polish and landed straight on her back. She tried hydrocodone for pain but it did not help, movement and palpation exacerbated the pain. She denies loss of consciousness, she did not hit her head, denies numbness or weakness. No fevers, loss of bowel or bladder function. No anticoagulation other than daily 81 mg aspirin.  Past Medical History  Diagnosis Date  . Anemia   . Chronic kidney disease, unspecified   . Mixed connective tissue disease   . Raynaud's disease   . Vitamin D deficiency disease   . Hypertension   . Hyperlipidemia   . Small kidney     left  . GERD (gastroesophageal reflux disease)   . Chronic back pain   . Abnormal LFTs   . Hypercalcemia   . Hyperkalemia   . Lupus   . Peptic ulcer disease   . Arthritis   . Chronic headaches   . IBS (irritable bowel syndrome)   . Allergic rhinitis   . Neuropathy   . DDD (degenerative disc disease)    Past Surgical History  Procedure Laterality Date  . Appendectomy  1972  . Abdominal hysterectomy  1975  . Tonsillectomy    . Wrist ganglion excision      left  . Lipoma excision      back   Family History  Problem Relation Age of Onset  . Ovarian cancer Sister   . Colon cancer Sister   . Heart disease Paternal Grandfather   . Heart disease Maternal Grandfather   . Kidney disease Brother   . Kidney disease Mother   . Kidney disease Maternal Grandmother   . Diabetes Paternal Grandmother     History  Substance Use Topics  . Smoking status: Former Smoker    Types: Cigarettes  . Smokeless tobacco: Never Used  . Alcohol Use: No   OB History   Grav Para Term Preterm Abortions TAB SAB Ect Mult Living                 Review of Systems  Constitutional: Negative for fever.  HENT: Negative for sore throat.   Eyes: Negative for visual disturbance.  Respiratory: Negative for shortness of breath.   Cardiovascular: Negative for chest pain.  Gastrointestinal: Negative for abdominal pain.  Endocrine: Negative for polyuria.  Genitourinary: Negative for dysuria.  Musculoskeletal: Positive for back pain.  Skin: Negative for rash.  Neurological: Negative for headaches.      Allergies  Sulfonamide derivatives  Home Medications   Prior to Admission medications   Medication Sig Start Date End Date Taking? Authorizing Provider  ALPRAZolam (XANAX) 0.25 MG tablet Take 0.25 mg by mouth 3 (three) times daily as needed.     Historical Provider, MD  aspirin 81 MG tablet Take 81 mg by mouth daily.    Historical Provider, MD  cetirizine (ZYRTEC) 5 MG tablet Take 5 mg by mouth daily.    Historical Provider, MD  cholecalciferol (VITAMIN D) 1000 UNITS tablet  Take 1,000 Units by mouth daily.    Historical Provider, MD  ferrous gluconate (FERGON) 240 (27 FE) MG tablet Take 240 mg by mouth 2 (two) times daily.    Historical Provider, MD  hydroxychloroquine (PLAQUENIL) 200 MG tablet Take 200 mg by mouth 2 (two) times daily.     Historical Provider, MD  ketoconazole (NIZORAL) 2 % cream Apply 1 application topically as needed.     Historical Provider, MD  mirtazapine (REMERON) 15 MG tablet  11/10/11   Historical Provider, MD  Multiple Vitamin (MULTIVITAMIN) capsule Take 1 capsule by mouth daily.    Historical Provider, MD  olmesartan (BENICAR) 20 MG tablet Take 10 mg by mouth 2 (two) times daily.     Historical Provider, MD  omeprazole (PRILOSEC) 20 MG capsule  09/17/11   Historical Provider, MD   predniSONE (DELTASONE) 5 MG tablet Take 5 mg by mouth daily.    Historical Provider, MD  pregabalin (LYRICA) 75 MG capsule Take 75 mg by mouth 2 (two) times daily.    Historical Provider, MD  promethazine (PHENERGAN) 12.5 MG suppository Place 1 suppository (12.5 mg total) rectally every 6 (six) hours as needed for nausea. 11/18/11   Milus Banister, MD  promethazine (PHENERGAN) 25 MG tablet Take 25 mg by mouth every 6 (six) hours as needed.    Historical Provider, MD  pyridOXINE (VITAMIN B-6) 100 MG tablet Take 100 mg by mouth daily.    Historical Provider, MD  spironolactone (ALDACTONE) 50 MG tablet Take 25 mg by mouth daily.     Historical Provider, MD  traMADol Veatrice Bourbon) 50 MG tablet  09/24/11   Historical Provider, MD  traZODone (DESYREL) 50 MG tablet Take 50 mg by mouth at bedtime.    Historical Provider, MD  vitamin B-12 (CYANOCOBALAMIN) 100 MCG tablet Take 100 mcg by mouth daily.     Historical Provider, MD  zolpidem (AMBIEN) 10 MG tablet Take 10 mg by mouth at bedtime as needed.    Historical Provider, MD   BP 145/61  Pulse 72  Temp(Src) 98.5 F (36.9 C) (Oral)  Resp 16  SpO2 96% Physical Exam  Nursing note and vitals reviewed. Constitutional:  Awake, alert, nontoxic appearance with baseline speech.  HENT:  Head: Atraumatic.  Eyes: Pupils are equal, round, and reactive to light. Right eye exhibits no discharge. Left eye exhibits no discharge.  Neck: Neck supple.  Cardiovascular: Normal rate and regular rhythm.   No murmur heard. Pulmonary/Chest: Effort normal and breath sounds normal. No respiratory distress. She has no wheezes. She has no rales. She exhibits no tenderness.  Abdominal: Soft. Bowel sounds are normal. She exhibits no mass. There is no tenderness. There is no rebound.  Musculoskeletal:  Tenderness palpation of the mid thoracic spine with associated rib tenderness no other cervical or lumbar tenderness. Bilateral lower extremities non tender without new rashes or  color change, baseline ROM with intact DP / PT pulses, CR<2 secs all digits bilaterally, sensation baseline light touch bilaterally for pt, motor symmetric bilateral 5 / 5 hip flexion, quadriceps, hamstrings, EHL, foot dorsiflexion, foot plantarflexion, gait somewhat antalgic but without apparent new ataxia.  Neurological:  Mental status baseline for patient.  Upper extremity motor strength and sensation intact and symmetric bilaterally.  Skin: No rash noted.  Psychiatric: She has a normal mood and affect.    ED Course  Procedures (including critical care time) Labs Review Labs Reviewed - No data to display  Imaging Review No results found.   EKG  Interpretation None     Meds given in ED:  Medications  HYDROmorphone (DILAUDID) injection 0.5 mg (0.5 mg Intramuscular Given 10/12/13 1833)    Discharge Medication List as of 10/12/2013 11:37 PM    START taking these medications   Details  oxyCODONE-acetaminophen (PERCOCET/ROXICET) 5-325 MG per tablet Take 2 tablets by mouth every 4 (four) hours as needed for moderate pain or severe pain., Starting 10/12/2013, Until Discontinued, Print       Filed Vitals:   10/12/13 1648 10/12/13 2047 10/12/13 2248 10/13/13 0004  BP: 145/61 126/48 141/62 138/58  Pulse: 72 63 59 64  Temp: 98.5 F (36.9 C)     TempSrc: Oral     Resp: 16 16 16 16   SpO2: 96% 98% 97% 96%    MDM  Vitals stable - WNL -afebrile Pt resting comfortably in ED. no apparent distress. Pain was managed in ED PE showed midthoracic tenderness consistent with T9 compression fracture found on CT. Patient is neurovascularly intact, low suspicion for cauda equina or other cord pathology.   Will DC with Percocet for pain, recommend followup with primary care for further evaluation and management. Discussed f/u with PCP and return precautions, pt very amenable to plan. Patient reports she has excellent access to primary care. The patient is stable, in good condition and is  appropriate for discharge.   Prior to patient discharge, I discussed and reviewed this case with Dr. Aline Brochure    Final diagnoses:  Fall as cause of accidental injury in home as place of occurrence  Compression fracture of body of thoracic vertebra        Verl Dicker, PA-C 10/13/13 0100

## 2013-10-13 NOTE — ED Provider Notes (Signed)
Medical screening examination/treatment/procedure(s) were conducted as a shared visit with non-physician practitioner(s) and myself.  I personally evaluated the patient during the encounter.   EKG Interpretation None      I interviewed and examined the patient. Lungs are CTAB. Cardiac exam wnl. Abdomen soft.  Found to have comp fx of T9 w/ 30% height loss. Pain controlled. Pt ambulatory. Will rec f/u w/ pcp.   Pamella Pert, MD 10/13/13 1622

## 2014-01-09 ENCOUNTER — Other Ambulatory Visit (HOSPITAL_COMMUNITY): Payer: Self-pay | Admitting: Neurosurgery

## 2014-01-09 DIAGNOSIS — S22000G Wedge compression fracture of unspecified thoracic vertebra, subsequent encounter for fracture with delayed healing: Secondary | ICD-10-CM | POA: Diagnosis not present

## 2014-01-10 DIAGNOSIS — R110 Nausea: Secondary | ICD-10-CM | POA: Diagnosis not present

## 2014-01-11 DIAGNOSIS — N183 Chronic kidney disease, stage 3 (moderate): Secondary | ICD-10-CM | POA: Diagnosis not present

## 2014-01-13 ENCOUNTER — Other Ambulatory Visit: Payer: Self-pay | Admitting: Internal Medicine

## 2014-01-13 DIAGNOSIS — R634 Abnormal weight loss: Secondary | ICD-10-CM

## 2014-01-18 ENCOUNTER — Ambulatory Visit
Admission: RE | Admit: 2014-01-18 | Discharge: 2014-01-18 | Disposition: A | Discharge status: Home / Self Care | Payer: Medicare Other | Source: Ambulatory Visit | Attending: Internal Medicine | Admitting: Internal Medicine

## 2014-01-18 ENCOUNTER — Encounter (HOSPITAL_COMMUNITY)
Admission: RE | Admit: 2014-01-18 | Discharge: 2014-01-18 | Disposition: A | Discharge status: Home / Self Care | Payer: Medicare Other | Source: Ambulatory Visit | Attending: Neurosurgery | Admitting: Neurosurgery

## 2014-01-18 ENCOUNTER — Encounter (HOSPITAL_COMMUNITY): Payer: Self-pay

## 2014-01-18 DIAGNOSIS — M4852XA Collapsed vertebra, not elsewhere classified, cervical region, initial encounter for fracture: Secondary | ICD-10-CM | POA: Diagnosis not present

## 2014-01-18 DIAGNOSIS — R634 Abnormal weight loss: Secondary | ICD-10-CM

## 2014-01-18 DIAGNOSIS — M4854XA Collapsed vertebra, not elsewhere classified, thoracic region, initial encounter for fracture: Secondary | ICD-10-CM | POA: Diagnosis not present

## 2014-01-18 DIAGNOSIS — M4856XA Collapsed vertebra, not elsewhere classified, lumbar region, initial encounter for fracture: Secondary | ICD-10-CM | POA: Diagnosis not present

## 2014-01-18 DIAGNOSIS — I2510 Atherosclerotic heart disease of native coronary artery without angina pectoris: Secondary | ICD-10-CM | POA: Diagnosis not present

## 2014-01-18 DIAGNOSIS — Z539 Procedure and treatment not carried out, unspecified reason: Secondary | ICD-10-CM | POA: Diagnosis not present

## 2014-01-18 DIAGNOSIS — R110 Nausea: Secondary | ICD-10-CM | POA: Diagnosis not present

## 2014-01-18 DIAGNOSIS — J432 Centrilobular emphysema: Secondary | ICD-10-CM | POA: Diagnosis not present

## 2014-01-18 DIAGNOSIS — R109 Unspecified abdominal pain: Secondary | ICD-10-CM | POA: Diagnosis not present

## 2014-01-18 HISTORY — DX: Nausea: R11.0

## 2014-01-18 HISTORY — DX: Anxiety disorder, unspecified: F41.9

## 2014-01-18 LAB — COMPREHENSIVE METABOLIC PANEL
ALT: 26 U/L (ref 0–35)
AST: 38 U/L — ABNORMAL HIGH (ref 0–37)
Albumin: 3.5 g/dL (ref 3.5–5.2)
Alkaline Phosphatase: 63 U/L (ref 39–117)
Anion gap: 9 (ref 5–15)
BUN: 15 mg/dL (ref 6–23)
CALCIUM: 9.2 mg/dL (ref 8.4–10.5)
CO2: 24 mmol/L (ref 19–32)
Chloride: 105 mEq/L (ref 96–112)
Creatinine, Ser: 1.5 mg/dL — ABNORMAL HIGH (ref 0.50–1.10)
GFR calc non Af Amer: 34 mL/min — ABNORMAL LOW (ref >90)
GFR, EST AFRICAN AMERICAN: 40 mL/min — AB (ref >90)
Glucose, Bld: 100 mg/dL — ABNORMAL HIGH (ref 70–99)
POTASSIUM: 4.1 mmol/L (ref 3.5–5.1)
SODIUM: 138 mmol/L (ref 135–145)
Total Bilirubin: 0.3 mg/dL (ref 0.3–1.2)
Total Protein: 6.3 g/dL (ref 6.0–8.3)

## 2014-01-18 LAB — CBC
HCT: 34.3 % — ABNORMAL LOW (ref 36.0–46.0)
Hemoglobin: 10.8 g/dL — ABNORMAL LOW (ref 12.0–15.0)
MCH: 27.6 pg (ref 26.0–34.0)
MCHC: 31.5 g/dL (ref 30.0–36.0)
MCV: 87.7 fL (ref 78.0–100.0)
Platelets: 253 10*3/uL (ref 150–400)
RBC: 3.91 MIL/uL (ref 3.87–5.11)
RDW: 15.3 % (ref 11.5–15.5)
WBC: 8.3 10*3/uL (ref 4.0–10.5)

## 2014-01-18 LAB — SURGICAL PCR SCREEN
MRSA, PCR: NEGATIVE
STAPHYLOCOCCUS AUREUS: NEGATIVE

## 2014-01-18 MED ORDER — IOHEXOL 300 MG/ML  SOLN: 75.0000 mL | Freq: Once | INTRAMUSCULAR | Status: AC | PRN

## 2014-01-18 NOTE — Pre-Procedure Instructions (Signed)
Holly Jimenez  01/18/2014   Your procedure is scheduled on:   Thursday  01/19/14  Report to [facility] Admitting at 130 PM.  Call this number if you have problems the morning of surgery: [phone]   Remember:   Do not eat food or drink liquids after midnight.   Take these medicines the morning of surgery with A SIP OF WATER:  ALPRAZOLAM, HYDROCODONE IF NEEDED, OMEPRAZOLE (PRILOSEC)     (STOP ASPIRIN, COUMADIN, PLAVIX, EFFIENT, HERBAL MEDICINES)   Do not wear jewelry, make-up or nail polish.  Do not wear lotions, powders, or perfumes. You may wear deodorant.  Do not shave 48 hours prior to surgery. Men may shave face and neck.  Do not bring valuables to the hospital.  [facility] is not responsible                  for any belongings or valuables.               Contacts, dentures or bridgework may not be worn into surgery.  Leave suitcase in the car. After surgery it may be brought to your room.  For patients admitted to the hospital, discharge time is determined by your                treatment team.               Patients discharged the day of surgery will not be allowed to drive  home.  Name and phone number of your driver:   Special Instructions: Shower using CHG 2 nights before surgery and the night before surgery.  If you shower the day of surgery use CHG.  Use special wash - you have one bottle of CHG for all showers.  You should use approximately 1/3 of the bottle for each shower.   Please read over the following fact sheets that you were given: Pain Booklet, Coughing and Deep Breathing, MRSA Information and Surgical Site Infection Prevention

## 2014-01-19 ENCOUNTER — Ambulatory Visit (HOSPITAL_COMMUNITY)
Admission: RE | Admit: 2014-01-19 | Discharge: 2014-01-19 | Disposition: A | Discharge status: Home / Self Care | Payer: Medicare Other | Source: Ambulatory Visit | Attending: Neurosurgery | Admitting: Neurosurgery

## 2014-01-19 ENCOUNTER — Encounter (HOSPITAL_COMMUNITY)
Admission: RE | Disposition: A | Discharge status: Home / Self Care | Payer: Self-pay | Source: Ambulatory Visit | Attending: Neurosurgery

## 2014-01-19 ENCOUNTER — Encounter (HOSPITAL_COMMUNITY): Payer: Self-pay | Admitting: *Deleted

## 2014-01-19 ENCOUNTER — Encounter (HOSPITAL_COMMUNITY): Payer: Self-pay | Admitting: Anesthesiology

## 2014-01-19 DIAGNOSIS — Z539 Procedure and treatment not carried out, unspecified reason: Secondary | ICD-10-CM | POA: Insufficient documentation

## 2014-01-19 DIAGNOSIS — M4852XA Collapsed vertebra, not elsewhere classified, cervical region, initial encounter for fracture: Secondary | ICD-10-CM | POA: Insufficient documentation

## 2014-01-19 SURGERY — KYPHOPLASTY
Anesthesia: General

## 2014-01-19 MED ORDER — PROPOFOL 10 MG/ML IV BOLUS
INTRAVENOUS | Status: AC
  Filled 2014-01-19: qty 20

## 2014-01-19 MED ORDER — FENTANYL CITRATE 0.05 MG/ML IJ SOLN
INTRAMUSCULAR | Status: AC
  Filled 2014-01-19: qty 5

## 2014-01-19 MED ORDER — LACTATED RINGERS IV SOLN
INTRAVENOUS | Status: DC
  Administered 2014-01-19: 14:00:00 via INTRAVENOUS

## 2014-01-19 SURGICAL SUPPLY — 36 items
BLADE CLIPPER SURG (BLADE) IMPLANT
BLADE SURG 15 STRL LF DISP TIS (BLADE) ×2 IMPLANT
BLADE SURG 15 STRL SS (BLADE) ×3
CONT SPEC 4OZ CLIKSEAL STRL BL (MISCELLANEOUS) ×8 IMPLANT
DRAPE C-ARM 42X72 X-RAY (DRAPES) ×4 IMPLANT
DRAPE LAPAROTOMY 100X72X124 (DRAPES) ×4 IMPLANT
DRAPE PROXIMA HALF (DRAPES) ×4 IMPLANT
DRAPE SURG 17X23 STRL (DRAPES) ×4 IMPLANT
DRSG TELFA 3X8 NADH (GAUZE/BANDAGES/DRESSINGS) IMPLANT
DURAPREP 26ML APPLICATOR (WOUND CARE) ×4 IMPLANT
GAUZE SPONGE 4X4 16PLY XRAY LF (GAUZE/BANDAGES/DRESSINGS) ×4 IMPLANT
GLOVE ECLIPSE 6.5 STRL STRAW (GLOVE) ×4 IMPLANT
GLOVE EXAM NITRILE LRG STRL (GLOVE) IMPLANT
GLOVE EXAM NITRILE MD LF STRL (GLOVE) IMPLANT
GLOVE EXAM NITRILE XL STR (GLOVE) IMPLANT
GLOVE EXAM NITRILE XS STR PU (GLOVE) IMPLANT
GOWN STRL REUS W/ TWL LRG LVL3 (GOWN DISPOSABLE) ×4 IMPLANT
GOWN STRL REUS W/ TWL XL LVL3 (GOWN DISPOSABLE) IMPLANT
GOWN STRL REUS W/TWL 2XL LVL3 (GOWN DISPOSABLE) IMPLANT
GOWN STRL REUS W/TWL LRG LVL3 (GOWN DISPOSABLE) ×6
GOWN STRL REUS W/TWL XL LVL3 (GOWN DISPOSABLE)
KIT BASIN OR (CUSTOM PROCEDURE TRAY) ×4 IMPLANT
KIT ROOM TURNOVER OR (KITS) ×4 IMPLANT
LIQUID BAND (GAUZE/BANDAGES/DRESSINGS) ×4 IMPLANT
NDL HYPO 25X1 1.5 SAFETY (NEEDLE) ×1 IMPLANT
NEEDLE HYPO 25X1 1.5 SAFETY (NEEDLE) ×3 IMPLANT
NS IRRIG 1000ML POUR BTL (IV SOLUTION) ×4 IMPLANT
PACK SURGICAL SETUP 50X90 (CUSTOM PROCEDURE TRAY) ×4 IMPLANT
PAD ARMBOARD 7.5X6 YLW CONV (MISCELLANEOUS) ×12 IMPLANT
PAD DRESSING TELFA 3X8 NADH (GAUZE/BANDAGES/DRESSINGS) IMPLANT
SPECIMEN JAR SMALL (MISCELLANEOUS) IMPLANT
STAPLER SKIN PROX WIDE 3.9 (STAPLE) ×4 IMPLANT
SUT VIC AB 3-0 SH 8-18 (SUTURE) ×4 IMPLANT
SYR CONTROL 10ML LL (SYRINGE) ×8 IMPLANT
TOWEL OR 17X24 6PK STRL BLUE (TOWEL DISPOSABLE) ×4 IMPLANT
TOWEL OR 17X26 10 PK STRL BLUE (TOWEL DISPOSABLE) ×4 IMPLANT

## 2014-01-19 NOTE — Progress Notes (Addendum)
Per Dr. Deatra Canter. Pt to be cancelled, needs to have cardiac clearance prior to surgery. IV removed, pt given water to drink. Pt dressing at present. Dr. Christella Noa to come see pt prior to DC home, awaiting MD arrival.

## 2014-01-19 NOTE — Progress Notes (Signed)
Dr. Christella Noa in to see pt. Pt DC home with family and personal belongings, condition stable at time of DC.

## 2014-02-07 DIAGNOSIS — M949 Disorder of cartilage, unspecified: Secondary | ICD-10-CM | POA: Diagnosis not present

## 2014-02-07 DIAGNOSIS — I10 Essential (primary) hypertension: Secondary | ICD-10-CM | POA: Diagnosis not present

## 2014-02-07 DIAGNOSIS — T148 Other injury of unspecified body region: Secondary | ICD-10-CM | POA: Diagnosis not present

## 2014-02-07 DIAGNOSIS — N183 Chronic kidney disease, stage 3 (moderate): Secondary | ICD-10-CM | POA: Diagnosis not present

## 2014-02-09 DIAGNOSIS — N183 Chronic kidney disease, stage 3 (moderate): Secondary | ICD-10-CM | POA: Diagnosis not present

## 2014-02-09 DIAGNOSIS — I129 Hypertensive chronic kidney disease with stage 1 through stage 4 chronic kidney disease, or unspecified chronic kidney disease: Secondary | ICD-10-CM | POA: Diagnosis not present

## 2014-02-09 DIAGNOSIS — E875 Hyperkalemia: Secondary | ICD-10-CM | POA: Diagnosis not present

## 2014-02-09 DIAGNOSIS — N179 Acute kidney failure, unspecified: Secondary | ICD-10-CM | POA: Diagnosis not present

## 2014-02-16 ENCOUNTER — Encounter: Payer: Self-pay | Admitting: Interventional Cardiology

## 2014-02-16 ENCOUNTER — Ambulatory Visit (INDEPENDENT_AMBULATORY_CARE_PROVIDER_SITE_OTHER): Payer: Medicare Other | Admitting: Interventional Cardiology

## 2014-02-16 VITALS — BP 132/64 | HR 69 | Ht 61.0 in | Wt 117.0 lb

## 2014-02-16 DIAGNOSIS — Z01810 Encounter for preprocedural cardiovascular examination: Secondary | ICD-10-CM | POA: Diagnosis not present

## 2014-02-16 DIAGNOSIS — R9431 Abnormal electrocardiogram [ECG] [EKG]: Secondary | ICD-10-CM

## 2014-02-16 DIAGNOSIS — I7300 Raynaud's syndrome without gangrene: Secondary | ICD-10-CM | POA: Insufficient documentation

## 2014-02-16 DIAGNOSIS — I10 Essential (primary) hypertension: Secondary | ICD-10-CM | POA: Insufficient documentation

## 2014-02-16 DIAGNOSIS — G629 Polyneuropathy, unspecified: Secondary | ICD-10-CM | POA: Insufficient documentation

## 2014-02-16 DIAGNOSIS — K289 Gastrojejunal ulcer, unspecified as acute or chronic, without hemorrhage or perforation: Secondary | ICD-10-CM | POA: Insufficient documentation

## 2014-02-16 NOTE — Patient Instructions (Signed)
Your physician has requested that you have an echocardiogram. Echocardiography is a painless test that uses sound waves to create images of your heart. It provides your doctor with information about the size and shape of your heart and how well your heart's chambers and valves are working. This procedure takes approximately one hour. There are no restrictions for this procedure.   Your physician recommends that you schedule a follow-up appointment as needed with Dr. Irish Lack.

## 2014-02-16 NOTE — Progress Notes (Signed)
Patient ID: Holly Jimenez, female   DOB: July 13, 1945, 69 y.o.   MRN: [id number]     Patient ID: Holly Jimenez MRN: [id number] DOB/AGE: 1945-07-18 69 y.o.   Referring Physician Dr. Ashok Pall   Reason for Consultation Preoperative evlaution  HPI: 69 y/o woman with HTN, CKD who had kyphoplasty canceled due to abnormal ECG the day before surgery.  She fell in 10/15 and has been more limited since that time.  Prior to that, she was active.  She was walking up stairs without difficulty.  She had no chest pain.    Her ECG showed some LVH.  She is here for further evaluation.   Her Benicar was restarted recently.    No prior cardiac testing per her recollection.   Current Outpatient Prescriptions  Medication Sig Dispense Refill  . ALPRAZolam (XANAX) 0.25 MG tablet Take 0.25 mg by mouth 3 (three) times daily as needed for anxiety.     Marland Kitchen aspirin EC 81 MG tablet Take 81 mg by mouth daily.    Marland Kitchen BENICAR 5 MG tablet Take 5 mg by mouth daily.   0  . cetirizine (ZYRTEC) 5 MG tablet Take 5 mg by mouth daily as needed for allergies.     . cholecalciferol (VITAMIN D) 1000 UNITS tablet Take 1,000 Units by mouth daily.    . ferrous gluconate (FERGON) 240 (27 FE) MG tablet Take 240 mg by mouth 2 (two) times daily.    Marland Kitchen HYDROcodone-acetaminophen (NORCO/VICODIN) 5-325 MG per tablet Take 1 tablet by mouth every 6 (six) hours as needed for moderate pain.    . hydroxychloroquine (PLAQUENIL) 200 MG tablet Take 200 mg by mouth 2 (two) times daily.     Marland Kitchen ketoconazole (NIZORAL) 2 % cream Apply 1 application topically daily as needed (infection treatment).     Marland Kitchen lidocaine (LIDODERM) 5 % Place 1 patch onto the skin daily as needed (pain). Remove & Discard patch within 12 hours or as directed by MD    . mirtazapine (REMERON) 15 MG tablet Take 7.5 mg by mouth at bedtime.     . Multiple Vitamin (MULTIVITAMIN WITH MINERALS) TABS tablet Take 1 tablet by mouth daily.    Marland Kitchen omeprazole (PRILOSEC) 20  MG capsule Take 20 mg by mouth 2 (two) times daily.     Marland Kitchen PHENADOZ 25 MG suppository Place 25 mg rectally every 6 (six) hours as needed.   0  . predniSONE (DELTASONE) 5 MG tablet Take 5 mg by mouth daily. continuous    . pregabalin (LYRICA) 75 MG capsule Take 75 mg by mouth 2 (two) times daily.    . promethazine (PHENERGAN) 12.5 MG suppository Place 1 suppository (12.5 mg total) rectally every 6 (six) hours as needed for nausea. (Patient taking differently: Place 25 mg rectally every 12 (twelve) hours as needed for nausea. ) 60 each 2  . pyridOXINE (VITAMIN B-6) 100 MG tablet Take 100 mg by mouth daily.    . traZODone (DESYREL) 50 MG tablet Take 50 mg by mouth at bedtime.    . vitamin B-12 (CYANOCOBALAMIN) 1000 MCG tablet Take 1,000 mcg by mouth daily.    Marland Kitchen zolpidem (AMBIEN) 10 MG tablet Take 10 mg by mouth at bedtime as needed for sleep.      No current facility-administered medications for this visit.   Past Medical History  Diagnosis Date  . Anemia   . Mixed connective tissue disease   . Raynaud's disease   . Vitamin D deficiency disease   .  Hyperlipidemia   . GERD (gastroesophageal reflux disease)   . Chronic back pain   . Abnormal LFTs   . Hypercalcemia   . Hyperkalemia   . Lupus   . Peptic ulcer disease   . Arthritis   . Chronic headaches   . IBS (irritable bowel syndrome)   . Allergic rhinitis   . Neuropathy   . DDD (degenerative disc disease)   . Hypertension     KIDNEY SPECIALIST TOOK OFF BENICAR  . Anxiety   . Chronic kidney disease, unspecified   . Small kidney     left  . Nausea     Family History  Problem Relation Age of Onset  . Ovarian cancer Sister   . Colon cancer Sister   . Heart disease Paternal Grandfather   . Heart disease Maternal Grandfather   . Kidney disease Brother   . Kidney disease Mother   . Kidney disease Maternal Grandmother   . Diabetes Paternal Grandmother     History   Social History  . Marital Status: Divorced    Spouse Name:  N/A  . Number of Children: 1  . Years of Education: N/A   Occupational History  . retired [employer]   Social History Main Topics  . Smoking status: Former Smoker    Types: Cigarettes  . Smokeless tobacco: Never Used  . Alcohol Use: Yes     Comment: RARELEY    . Drug Use: 2.00 per week     Comment: marijuana for nausea  . Sexual Activity: Not on file   Other Topics Concern  . Not on file   Social History Narrative    Past Surgical History  Procedure Laterality Date  . Appendectomy  1972  . Abdominal hysterectomy  1975  . Tonsillectomy    . Wrist ganglion excision      left  . Lipoma excision      back  . Foot surgery      LEFT      (Not in a hospital admission)  Review of systems complete and found to be negative unless listed above .  No nausea, vomiting.  No fever chills, No focal weakness,  No palpitations.  Physical Exam: Filed Vitals:   02/16/14 1436  BP: 132/64  Pulse: 69    Weight: 117 lb (53.071 kg)  Physical exam:  [place]/AT EOMI No JVD, No carotid bruit RRR S1S2  No wheezing Soft. NT, nondistended No edema. No focal motor or sensory deficits Normal affect  Labs:   Lab Results  Component Value Date   WBC 8.3 01/18/2014   HGB 10.8* 01/18/2014   HCT 34.3* 01/18/2014   MCV 87.7 01/18/2014   PLT 253 01/18/2014   No results for input(s): NA, K, CL, CO2, BUN, CREATININE, CALCIUM, PROT, BILITOT, ALKPHOS, ALT, AST, GLUCOSE in the last 168 hours.  Invalid input(s): LABALBU No results found for: CKTOTAL, CKMB, CKMBINDEX, TROPONINI No results found for: CHOL No results found for: HDL No results found for: LDLCALC No results found for: TRIG No results found for: CHOLHDL No results found for: LDLDIRECT     EKG: NSR, LVH.  ASSESSMENT AND PLAN:   Preoperative evaluation:  Would check echo due to abnormal ECG.  Likely LVH related to HTN.  She was recently able to do 4 METS of activity.  No cardiac sx.  No sx of ischemia.  No stress test  required if the EF is normal.    HTN: Controlled well at home.  BP  typically in the 110-120.    CRI: Cr up to 2.7 but has decreased more recently.  Signed:   Mina Marble, MD, [facility] 02/16/2014, 3:04 PM

## 2014-02-21 ENCOUNTER — Ambulatory Visit (HOSPITAL_COMMUNITY): Payer: Medicare Other | Attending: Cardiology | Admitting: Radiology

## 2014-02-21 DIAGNOSIS — I10 Essential (primary) hypertension: Secondary | ICD-10-CM | POA: Diagnosis not present

## 2014-02-21 DIAGNOSIS — R9431 Abnormal electrocardiogram [ECG] [EKG]: Secondary | ICD-10-CM | POA: Insufficient documentation

## 2014-02-21 NOTE — Progress Notes (Signed)
Echocardiogram performed.  

## 2014-02-23 DIAGNOSIS — I129 Hypertensive chronic kidney disease with stage 1 through stage 4 chronic kidney disease, or unspecified chronic kidney disease: Secondary | ICD-10-CM | POA: Diagnosis not present

## 2014-02-23 DIAGNOSIS — N183 Chronic kidney disease, stage 3 (moderate): Secondary | ICD-10-CM | POA: Diagnosis not present

## 2014-02-28 ENCOUNTER — Other Ambulatory Visit (HOSPITAL_COMMUNITY): Payer: Self-pay | Admitting: Neurosurgery

## 2014-03-06 ENCOUNTER — Encounter (HOSPITAL_COMMUNITY)
Admission: RE | Admit: 2014-03-06 | Discharge: 2014-03-06 | Disposition: A | Discharge status: Home / Self Care | Payer: Medicare Other | Source: Ambulatory Visit | Attending: Neurosurgery | Admitting: Neurosurgery

## 2014-03-06 ENCOUNTER — Encounter (HOSPITAL_COMMUNITY): Payer: Self-pay

## 2014-03-06 DIAGNOSIS — I129 Hypertensive chronic kidney disease with stage 1 through stage 4 chronic kidney disease, or unspecified chronic kidney disease: Secondary | ICD-10-CM | POA: Diagnosis not present

## 2014-03-06 DIAGNOSIS — N183 Chronic kidney disease, stage 3 (moderate): Secondary | ICD-10-CM | POA: Diagnosis not present

## 2014-03-06 DIAGNOSIS — Z7982 Long term (current) use of aspirin: Secondary | ICD-10-CM | POA: Diagnosis not present

## 2014-03-06 DIAGNOSIS — Y929 Unspecified place or not applicable: Secondary | ICD-10-CM | POA: Diagnosis not present

## 2014-03-06 DIAGNOSIS — S22079A Unspecified fracture of T9-T10 vertebra, initial encounter for closed fracture: Secondary | ICD-10-CM | POA: Diagnosis not present

## 2014-03-06 DIAGNOSIS — Z882 Allergy status to sulfonamides status: Secondary | ICD-10-CM | POA: Diagnosis not present

## 2014-03-06 DIAGNOSIS — W1830XA Fall on same level, unspecified, initial encounter: Secondary | ICD-10-CM | POA: Diagnosis not present

## 2014-03-06 DIAGNOSIS — Z79899 Other long term (current) drug therapy: Secondary | ICD-10-CM | POA: Diagnosis not present

## 2014-03-06 DIAGNOSIS — M329 Systemic lupus erythematosus, unspecified: Secondary | ICD-10-CM | POA: Diagnosis not present

## 2014-03-06 LAB — CBC
HCT: 35.5 % — ABNORMAL LOW (ref 36.0–46.0)
Hemoglobin: 10.9 g/dL — ABNORMAL LOW (ref 12.0–15.0)
MCH: 27.7 pg (ref 26.0–34.0)
MCHC: 30.7 g/dL (ref 30.0–36.0)
MCV: 90.1 fL (ref 78.0–100.0)
PLATELETS: 270 10*3/uL (ref 150–400)
RBC: 3.94 MIL/uL (ref 3.87–5.11)
RDW: 14.7 % (ref 11.5–15.5)
WBC: 8.2 10*3/uL (ref 4.0–10.5)

## 2014-03-06 LAB — BASIC METABOLIC PANEL
Anion gap: 3 — ABNORMAL LOW (ref 5–15)
BUN: 25 mg/dL — ABNORMAL HIGH (ref 6–23)
CO2: 27 mmol/L (ref 19–32)
Calcium: 9.1 mg/dL (ref 8.4–10.5)
Chloride: 111 mmol/L (ref 96–112)
Creatinine, Ser: 1.63 mg/dL — ABNORMAL HIGH (ref 0.50–1.10)
GFR, EST AFRICAN AMERICAN: 36 mL/min — AB (ref >90)
GFR, EST NON AFRICAN AMERICAN: 31 mL/min — AB (ref >90)
GLUCOSE: 95 mg/dL (ref 70–99)
Potassium: 4.4 mmol/L (ref 3.5–5.1)
SODIUM: 141 mmol/L (ref 135–145)

## 2014-03-06 LAB — SURGICAL PCR SCREEN
MRSA, PCR: NEGATIVE
Staphylococcus aureus: NEGATIVE

## 2014-03-06 NOTE — Pre-Procedure Instructions (Addendum)
Holly Jimenez  03/06/2014   Your procedure is scheduled on:  3.2.16  Report to [facility] cone short stay admitting at 1200 AM.  Call this number if you have problems the morning of surgery: [phone]   Remember:   Do not eat food or drink liquids after midnight.   Take these medicines the morning of surgery with A SIP OF WATER: xanax, pain med if needed, plaquenil, prilosec, lyrica    STOP all herbel meds, nsaids (aleve,naproxen,advil,ibuprofen) starting now including vitamins, aspirin,   Do not wear jewelry, make-up or nail polish.  Do not wear lotions, powders, or perfumes. You may wear deodorant.  Do not shave 48 hours prior to surgery. Men may shave face and neck.  Do not bring valuables to the hospital.  [facility] is not responsible                  for any belongings or valuables.               Contacts, dentures or bridgework may not be worn into surgery.  Leave suitcase in the car. After surgery it may be brought to your room.  For patients admitted to the hospital, discharge time is determined by your                treatment team.               Patients discharged the day of surgery will not be allowed to drive  home.  Name and phone number of your driver:   Special Instructions:  Special Instructions: [name] - Preparing for Surgery  Before surgery, you can play an important role.  Because skin is not sterile, your skin needs to be as free of germs as possible.  You can reduce the number of germs on you skin by washing with CHG (chlorahexidine gluconate) soap before surgery.  CHG is an antiseptic cleaner which kills germs and bonds with the skin to continue killing germs even after washing.  Please DO NOT use if you have an allergy to CHG or antibacterial soaps.  If your skin becomes reddened/irritated stop using the CHG and inform your nurse when you arrive at Short Stay.  Do not shave (including legs and underarms) for at least 48 hours prior to the first  CHG shower.  You may shave your face.  Please follow these instructions carefully:   1.  Shower with CHG Soap the night before surgery and the morning of Surgery.  2.  If you choose to wash your hair, wash your hair first as usual with your normal shampoo.  3.  After you shampoo, rinse your hair and body thoroughly to remove the Shampoo.  4.  Use CHG as you would any other liquid soap.  You can apply chg directly  to the skin and wash gently with scrungie or a clean washcloth.  5.  Apply the CHG Soap to your body ONLY FROM THE NECK DOWN.  Do not use on open wounds or open sores.  Avoid contact with your eyes ears, mouth and genitals (private parts).  Wash genitals (private parts)       with your normal soap.  6.  Wash thoroughly, paying special attention to the area where your surgery will be performed.  7.  Thoroughly rinse your body with warm water from the neck down.  8.  DO NOT shower/wash with your normal soap after using and rinsing off the CHG Soap.  9.  Pat yourself dry with a clean towel.            10.  Wear clean pajamas.            11.  Place clean sheets on your bed the night of your first shower and do not sleep with pets.  Day of Surgery  Do not apply any lotions/deodorants the morning of surgery.  Please wear clean clothes to the hospital/surgery center.   Please read over the following fact sheets that you were given: Pain Booklet, Coughing and Deep Breathing, MRSA Information and Surgical Site Infection Prevention

## 2014-03-07 MED ORDER — CEFAZOLIN SODIUM-DEXTROSE 2-3 GM-% IV SOLR
2.0000 g | INTRAVENOUS | Status: AC
  Administered 2014-03-08: 2 g via INTRAVENOUS
  Filled 2014-03-07: qty 50

## 2014-03-07 NOTE — Progress Notes (Signed)
Anesthesia Chart Review:  Patient is a 69 year old female scheduled for T9 kyphoplasty on 03/08/14 by Dr. Christella Noa. Procedure was initially scheduled for 01/19/14, but was canceled due to need for cardiology preoperative evaluation.  History includes former smoker, Raynaud's disease, GERD, lupus, IBS, HTN, anxiety, CKD stage III with atrophic left kidney (although not mentioned on 01/18/14 non-contrast CT). She also had AKI following Augmentin for URI. PCP is Dr. Maudie Mercury. Nephrologist is Dr. Marval Regal. Rheumatologist is Dr. Amil Amen. She was seen by cardiologist Dr. Irish Lack who cleared her following an echo (see below).   02/16/14 EKG: NSR, LVH, LAD, non-specific T wave abnormality.  02/21/14 Echo:  - Left ventricle: The cavity size was normal. There was mild focal basal hypertrophy of the septum. Systolic function was normal.The estimated ejection fraction was in the range of 60% to 65%.Wall motion was normal; there were no regional wall motionabnormalities. Doppler parameters are consistent with abnormalleft ventricular relaxation (grade 1 diastolic dysfunction). - Mitral valve: There was mild regurgitation.  CT of the chest, abd/pelvis 01/18/14:  IMPRESSION: No acute findings in the chest, abdomen or pelvis. Coronary artery disease. Moderate stool burden throughout the colon. Thoracolumbar compression fractures as above.  Preoperative labs noted. Cr 1.63, up from 1.50 on 01/18/14. Known CKD stage III that is followed at [facility]. H/H 10.9/35.5, stable.  If no acute changes then I would anticipate that she could proceed as planned.   Holly Jimenez [facility] Short Stay Center/Anesthesiology Phone [phone] 03/07/2014 11:07 AM

## 2014-03-08 ENCOUNTER — Inpatient Hospital Stay (HOSPITAL_COMMUNITY): Payer: Medicare Other | Admitting: Vascular Surgery

## 2014-03-08 ENCOUNTER — Inpatient Hospital Stay (HOSPITAL_COMMUNITY): Payer: Medicare Other | Admitting: Anesthesiology

## 2014-03-08 ENCOUNTER — Encounter (HOSPITAL_COMMUNITY)
Admission: RE | Disposition: A | Discharge status: Home / Self Care | Payer: Self-pay | Source: Ambulatory Visit | Attending: Neurosurgery

## 2014-03-08 ENCOUNTER — Encounter (HOSPITAL_COMMUNITY): Payer: Self-pay | Admitting: *Deleted

## 2014-03-08 ENCOUNTER — Observation Stay (HOSPITAL_COMMUNITY)
Admission: RE | Admit: 2014-03-08 | Discharge: 2014-03-09 | Disposition: A | Discharge status: Home / Self Care | Payer: Medicare Other | Source: Ambulatory Visit | Attending: Neurosurgery | Admitting: Neurosurgery

## 2014-03-08 ENCOUNTER — Inpatient Hospital Stay (HOSPITAL_COMMUNITY): Payer: Medicare Other

## 2014-03-08 DIAGNOSIS — S22079A Unspecified fracture of T9-T10 vertebra, initial encounter for closed fracture: Secondary | ICD-10-CM | POA: Diagnosis not present

## 2014-03-08 DIAGNOSIS — Y929 Unspecified place or not applicable: Secondary | ICD-10-CM | POA: Insufficient documentation

## 2014-03-08 DIAGNOSIS — Z7982 Long term (current) use of aspirin: Secondary | ICD-10-CM | POA: Diagnosis not present

## 2014-03-08 DIAGNOSIS — Z419 Encounter for procedure for purposes other than remedying health state, unspecified: Secondary | ICD-10-CM

## 2014-03-08 DIAGNOSIS — Z79899 Other long term (current) drug therapy: Secondary | ICD-10-CM | POA: Diagnosis not present

## 2014-03-08 DIAGNOSIS — I129 Hypertensive chronic kidney disease with stage 1 through stage 4 chronic kidney disease, or unspecified chronic kidney disease: Secondary | ICD-10-CM | POA: Diagnosis not present

## 2014-03-08 DIAGNOSIS — Z882 Allergy status to sulfonamides status: Secondary | ICD-10-CM | POA: Insufficient documentation

## 2014-03-08 DIAGNOSIS — N183 Chronic kidney disease, stage 3 (moderate): Secondary | ICD-10-CM | POA: Diagnosis not present

## 2014-03-08 DIAGNOSIS — W1830XA Fall on same level, unspecified, initial encounter: Secondary | ICD-10-CM | POA: Insufficient documentation

## 2014-03-08 DIAGNOSIS — N189 Chronic kidney disease, unspecified: Secondary | ICD-10-CM | POA: Diagnosis not present

## 2014-03-08 DIAGNOSIS — S22070A Wedge compression fracture of T9-T10 vertebra, initial encounter for closed fracture: Secondary | ICD-10-CM | POA: Diagnosis not present

## 2014-03-08 DIAGNOSIS — M329 Systemic lupus erythematosus, unspecified: Secondary | ICD-10-CM | POA: Diagnosis not present

## 2014-03-08 DIAGNOSIS — K589 Irritable bowel syndrome without diarrhea: Secondary | ICD-10-CM | POA: Diagnosis not present

## 2014-03-08 DIAGNOSIS — K219 Gastro-esophageal reflux disease without esophagitis: Secondary | ICD-10-CM | POA: Diagnosis not present

## 2014-03-08 DIAGNOSIS — S22000A Wedge compression fracture of unspecified thoracic vertebra, initial encounter for closed fracture: Secondary | ICD-10-CM | POA: Diagnosis present

## 2014-03-08 DIAGNOSIS — Z9889 Other specified postprocedural states: Secondary | ICD-10-CM | POA: Diagnosis not present

## 2014-03-08 HISTORY — PX: KYPHOPLASTY: SHX5884

## 2014-03-08 SURGERY — KYPHOPLASTY
Anesthesia: General | Site: Back | Laterality: Bilateral

## 2014-03-08 MED ORDER — MIDAZOLAM HCL 5 MG/5ML IJ SOLN
INTRAMUSCULAR | Status: DC | PRN
  Administered 2014-03-08: 2 mg via INTRAVENOUS

## 2014-03-08 MED ORDER — ADULT MULTIVITAMIN W/MINERALS CH
1.0000 | ORAL_TABLET | Freq: Every day | ORAL | Status: DC
  Administered 2014-03-09: 1 via ORAL
  Filled 2014-03-08: qty 1

## 2014-03-08 MED ORDER — FERROUS GLUCONATE 324 (38 FE) MG PO TABS
324.0000 mg | ORAL_TABLET | Freq: Two times a day (BID) | ORAL | Status: DC
  Administered 2014-03-09: 324 mg via ORAL
  Filled 2014-03-08 (×3): qty 1

## 2014-03-08 MED ORDER — IRBESARTAN 75 MG PO TABS
37.5000 mg | ORAL_TABLET | Freq: Every day | ORAL | Status: DC
  Administered 2014-03-08 – 2014-03-09 (×2): 37.5 mg via ORAL
  Filled 2014-03-08 (×2): qty 0.5

## 2014-03-08 MED ORDER — PREGABALIN 50 MG PO CAPS
75.0000 mg | ORAL_CAPSULE | Freq: Two times a day (BID) | ORAL | Status: DC
  Administered 2014-03-08 – 2014-03-09 (×2): 75 mg via ORAL
  Filled 2014-03-08 (×4): qty 1

## 2014-03-08 MED ORDER — LORATADINE 10 MG PO TABS
10.0000 mg | ORAL_TABLET | Freq: Every day | ORAL | Status: DC | PRN
  Filled 2014-03-08: qty 1

## 2014-03-08 MED ORDER — PREDNISONE 5 MG PO TABS
5.0000 mg | ORAL_TABLET | Freq: Every day | ORAL | Status: DC
Start: 2014-03-08 — End: 2014-03-09
  Filled 2014-03-08 (×2): qty 1

## 2014-03-08 MED ORDER — TETRAHYDROZOLINE HCL 0.05 % OP SOLN: 2.0000 [drp] | OPHTHALMIC | Status: DC | PRN

## 2014-03-08 MED ORDER — VITAMIN B-12 1000 MCG PO TABS
1000.0000 ug | ORAL_TABLET | Freq: Every day | ORAL | Status: DC
  Administered 2014-03-09: 1000 ug via ORAL
  Filled 2014-03-08: qty 1

## 2014-03-08 MED ORDER — HYDROMORPHONE HCL 1 MG/ML IJ SOLN
INTRAMUSCULAR | Status: AC
  Filled 2014-03-08: qty 1

## 2014-03-08 MED ORDER — TRAZODONE HCL 50 MG PO TABS
50.0000 mg | ORAL_TABLET | Freq: Every day | ORAL | Status: DC
  Filled 2014-03-08 (×2): qty 1

## 2014-03-08 MED ORDER — HYDROCODONE-ACETAMINOPHEN 5-325 MG PO TABS
1.0000 | ORAL_TABLET | ORAL | Status: DC | PRN
  Administered 2014-03-08 – 2014-03-09 (×3): 2 via ORAL
  Filled 2014-03-08: qty 1
  Filled 2014-03-08: qty 2
  Filled 2014-03-08: qty 1
  Filled 2014-03-08: qty 2

## 2014-03-08 MED ORDER — ONDANSETRON HCL 4 MG/2ML IJ SOLN
INTRAMUSCULAR | Status: DC | PRN
  Administered 2014-03-08: 4 mg via INTRAVENOUS

## 2014-03-08 MED ORDER — ROCURONIUM BROMIDE 100 MG/10ML IV SOLN
INTRAVENOUS | Status: DC | PRN
  Administered 2014-03-08: 20 mg via INTRAVENOUS

## 2014-03-08 MED ORDER — 0.9 % SODIUM CHLORIDE (POUR BTL) OPTIME
TOPICAL | Status: DC | PRN
  Administered 2014-03-08 (×2): 1000 mL

## 2014-03-08 MED ORDER — PROMETHAZINE HCL 25 MG/ML IJ SOLN
6.2500 mg | INTRAMUSCULAR | Status: DC | PRN
Start: 2014-03-08 — End: 2014-03-08

## 2014-03-08 MED ORDER — MIRTAZAPINE 7.5 MG PO TABS
7.5000 mg | ORAL_TABLET | Freq: Every day | ORAL | Status: DC
  Administered 2014-03-08: 7.5 mg via ORAL
  Filled 2014-03-08 (×2): qty 1

## 2014-03-08 MED ORDER — IOHEXOL 300 MG/ML  SOLN
INTRAMUSCULAR | Status: DC | PRN
Start: 2014-03-08 — End: 2014-03-08
  Administered 2014-03-08: 50 mL

## 2014-03-08 MED ORDER — PHENYLEPHRINE HCL 10 MG/ML IJ SOLN
INTRAMUSCULAR | Status: DC | PRN
  Administered 2014-03-08: 40 ug via INTRAVENOUS
  Administered 2014-03-08 (×2): 80 ug via INTRAVENOUS
  Administered 2014-03-08: 40 ug via INTRAVENOUS
  Administered 2014-03-08: 80 ug via INTRAVENOUS

## 2014-03-08 MED ORDER — ZOLPIDEM TARTRATE 5 MG PO TABS: 5.0000 mg | ORAL_TABLET | Freq: Every evening | ORAL | Status: DC | PRN

## 2014-03-08 MED ORDER — HYDROXYCHLOROQUINE SULFATE 200 MG PO TABS
200.0000 mg | ORAL_TABLET | Freq: Two times a day (BID) | ORAL | Status: DC
  Administered 2014-03-08 – 2014-03-09 (×2): 200 mg via ORAL
  Filled 2014-03-08 (×3): qty 1

## 2014-03-08 MED ORDER — MIDAZOLAM HCL 2 MG/2ML IJ SOLN
INTRAMUSCULAR | Status: AC
  Filled 2014-03-08: qty 2

## 2014-03-08 MED ORDER — LIDOCAINE HCL (CARDIAC) 20 MG/ML IV SOLN
INTRAVENOUS | Status: AC
  Filled 2014-03-08: qty 5

## 2014-03-08 MED ORDER — FENTANYL CITRATE 0.05 MG/ML IJ SOLN
INTRAMUSCULAR | Status: DC | PRN
  Administered 2014-03-08 (×3): 50 ug via INTRAVENOUS

## 2014-03-08 MED ORDER — PROPOFOL 10 MG/ML IV BOLUS
INTRAVENOUS | Status: AC
  Filled 2014-03-08: qty 20

## 2014-03-08 MED ORDER — GLYCOPYRROLATE 0.2 MG/ML IJ SOLN
INTRAMUSCULAR | Status: DC | PRN
  Administered 2014-03-08: .4 mg via INTRAVENOUS

## 2014-03-08 MED ORDER — LACTATED RINGERS IV SOLN
INTRAVENOUS | Status: DC | PRN
  Administered 2014-03-08 (×2): via INTRAVENOUS

## 2014-03-08 MED ORDER — ONDANSETRON HCL 4 MG/2ML IJ SOLN
INTRAMUSCULAR | Status: AC
  Filled 2014-03-08: qty 2

## 2014-03-08 MED ORDER — POTASSIUM CHLORIDE IN NACL 20-0.9 MEQ/L-% IV SOLN
INTRAVENOUS | Status: DC
  Filled 2014-03-08 (×3): qty 1000

## 2014-03-08 MED ORDER — VITAMIN B-6 100 MG PO TABS
100.0000 mg | ORAL_TABLET | Freq: Every day | ORAL | Status: DC
Start: 2014-03-09 — End: 2014-03-09
  Administered 2014-03-09: 100 mg via ORAL
  Filled 2014-03-08: qty 1

## 2014-03-08 MED ORDER — NEOSTIGMINE METHYLSULFATE 10 MG/10ML IV SOLN
INTRAVENOUS | Status: DC | PRN
  Administered 2014-03-08: 3 mg via INTRAVENOUS

## 2014-03-08 MED ORDER — ASPIRIN EC 81 MG PO TBEC
81.0000 mg | DELAYED_RELEASE_TABLET | Freq: Every day | ORAL | Status: DC
Start: 2014-03-09 — End: 2014-03-09
  Administered 2014-03-09: 81 mg via ORAL
  Filled 2014-03-08: qty 1

## 2014-03-08 MED ORDER — LIDOCAINE-EPINEPHRINE 0.5 %-1:200000 IJ SOLN
INTRAMUSCULAR | Status: DC | PRN
  Administered 2014-03-08: 10 mL

## 2014-03-08 MED ORDER — HYDROMORPHONE HCL 1 MG/ML IJ SOLN
0.2500 mg | INTRAMUSCULAR | Status: DC | PRN
  Administered 2014-03-08: 0.5 mg via INTRAVENOUS

## 2014-03-08 MED ORDER — SODIUM CHLORIDE 0.9 % IV SOLN
INTRAVENOUS | Status: DC
  Administered 2014-03-08: 10 mL/h via INTRAVENOUS

## 2014-03-08 MED ORDER — PANTOPRAZOLE SODIUM 40 MG PO TBEC
80.0000 mg | DELAYED_RELEASE_TABLET | Freq: Every day | ORAL | Status: DC
  Administered 2014-03-09: 80 mg via ORAL
  Filled 2014-03-08: qty 2

## 2014-03-08 MED ORDER — ALPRAZOLAM 0.25 MG PO TABS
0.2500 mg | ORAL_TABLET | Freq: Three times a day (TID) | ORAL | Status: DC | PRN
  Administered 2014-03-08: 0.25 mg via ORAL
  Filled 2014-03-08: qty 1

## 2014-03-08 MED ORDER — PROPOFOL 10 MG/ML IV BOLUS
INTRAVENOUS | Status: DC | PRN
  Administered 2014-03-08: 50 mg via INTRAVENOUS
  Administered 2014-03-08: 100 mg via INTRAVENOUS

## 2014-03-08 MED ORDER — LIDOCAINE HCL (CARDIAC) 20 MG/ML IV SOLN
INTRAVENOUS | Status: DC | PRN
  Administered 2014-03-08: 60 mg via INTRAVENOUS

## 2014-03-08 MED ORDER — FENTANYL CITRATE 0.05 MG/ML IJ SOLN
INTRAMUSCULAR | Status: AC
  Filled 2014-03-08: qty 5

## 2014-03-08 MED ORDER — MORPHINE SULFATE 2 MG/ML IJ SOLN: 2.0000 mg | INTRAMUSCULAR | Status: DC | PRN

## 2014-03-08 MED ORDER — FERROUS GLUCONATE 240 (27 FE) MG PO TABS: 240.0000 mg | ORAL_TABLET | Freq: Two times a day (BID) | ORAL | Status: DC

## 2014-03-08 MED ORDER — VITAMIN D3 25 MCG (1000 UNIT) PO TABS
1000.0000 [IU] | ORAL_TABLET | Freq: Every day | ORAL | Status: DC
  Administered 2014-03-09: 1000 [IU] via ORAL
  Filled 2014-03-08: qty 1

## 2014-03-08 MED ORDER — ROCURONIUM BROMIDE 50 MG/5ML IV SOLN
INTRAVENOUS | Status: AC
  Filled 2014-03-08: qty 1

## 2014-03-08 SURGICAL SUPPLY — 42 items
BLADE CLIPPER SURG (BLADE) IMPLANT
BLADE SURG 15 STRL LF DISP TIS (BLADE) ×1 IMPLANT
BLADE SURG 15 STRL SS (BLADE) ×3
CEMENT KYPHON C01A KIT/MIXER (Cement) ×2 IMPLANT
CONT SPEC 4OZ CLIKSEAL STRL BL (MISCELLANEOUS) ×4 IMPLANT
DRAPE C-ARM 42X72 X-RAY (DRAPES) ×3 IMPLANT
DRAPE LAPAROTOMY 100X72X124 (DRAPES) ×3 IMPLANT
DRAPE PROXIMA HALF (DRAPES) ×3 IMPLANT
DRAPE SURG 17X23 STRL (DRAPES) ×3 IMPLANT
DRAPE WARM FLUID 44X44 (DRAPE) ×2 IMPLANT
DRSG TELFA 3X8 NADH (GAUZE/BANDAGES/DRESSINGS) IMPLANT
DURAPREP 26ML APPLICATOR (WOUND CARE) ×3 IMPLANT
GAUZE SPONGE 4X4 16PLY XRAY LF (GAUZE/BANDAGES/DRESSINGS) ×3 IMPLANT
GLOVE BIOGEL PI IND STRL 7.5 (GLOVE) ×2 IMPLANT
GLOVE BIOGEL PI INDICATOR 7.5 (GLOVE) ×4
GLOVE ECLIPSE 6.5 STRL STRAW (GLOVE) ×3 IMPLANT
GLOVE EXAM NITRILE LRG STRL (GLOVE) IMPLANT
GLOVE EXAM NITRILE MD LF STRL (GLOVE) IMPLANT
GLOVE EXAM NITRILE XL STR (GLOVE) IMPLANT
GLOVE EXAM NITRILE XS STR PU (GLOVE) IMPLANT
GLOVE SURG SS PI 7.0 STRL IVOR (GLOVE) ×6 IMPLANT
GOWN STRL REUS W/ TWL LRG LVL3 (GOWN DISPOSABLE) ×1 IMPLANT
GOWN STRL REUS W/ TWL XL LVL3 (GOWN DISPOSABLE) IMPLANT
GOWN STRL REUS W/TWL 2XL LVL3 (GOWN DISPOSABLE) IMPLANT
GOWN STRL REUS W/TWL LRG LVL3 (GOWN DISPOSABLE) ×3
GOWN STRL REUS W/TWL XL LVL3 (GOWN DISPOSABLE) ×6
KIT BASIN OR (CUSTOM PROCEDURE TRAY) ×3 IMPLANT
KIT ROOM TURNOVER OR (KITS) ×3 IMPLANT
LIQUID BAND (GAUZE/BANDAGES/DRESSINGS) ×3 IMPLANT
NDL HYPO 25X1 1.5 SAFETY (NEEDLE) ×1 IMPLANT
NEEDLE HYPO 25X1 1.5 SAFETY (NEEDLE) ×3 IMPLANT
NS IRRIG 1000ML POUR BTL (IV SOLUTION) ×3 IMPLANT
PACK SURGICAL SETUP 50X90 (CUSTOM PROCEDURE TRAY) ×3 IMPLANT
PAD ARMBOARD 7.5X6 YLW CONV (MISCELLANEOUS) ×9 IMPLANT
PAD DRESSING TELFA 3X8 NADH (GAUZE/BANDAGES/DRESSINGS) IMPLANT
SPECIMEN JAR SMALL (MISCELLANEOUS) IMPLANT
STAPLER SKIN PROX WIDE 3.9 (STAPLE) ×3 IMPLANT
SUT VIC AB 3-0 SH 8-18 (SUTURE) ×3 IMPLANT
SYR CONTROL 10ML LL (SYRINGE) ×6 IMPLANT
TOWEL OR 17X24 6PK STRL BLUE (TOWEL DISPOSABLE) ×3 IMPLANT
TOWEL OR 17X26 10 PK STRL BLUE (TOWEL DISPOSABLE) ×3 IMPLANT
TRAY KYPHOPAK 15/3 ONESTEP 1ST (MISCELLANEOUS) ×2 IMPLANT

## 2014-03-08 NOTE — Anesthesia Procedure Notes (Signed)
Procedure Name: Intubation Date/Time: 03/08/2014 2:13 PM Performed by: Clearnce Sorrel Pre-anesthesia Checklist: Patient identified, Timeout performed, Emergency Drugs available, Suction available and Patient being monitored Patient Re-evaluated:Patient Re-evaluated prior to inductionOxygen Delivery Method: Circle system utilized Preoxygenation: Pre-oxygenation with 100% oxygen Intubation Type: IV induction Ventilation: Mask ventilation without difficulty Laryngoscope Size: Mac and 3 Grade View: Grade I Tube type: Oral Tube size: 7.0 mm Number of attempts: 1 Placement Confirmation: ETT inserted through vocal cords under direct vision,  breath sounds checked- equal and bilateral and positive ETCO2 Secured at: 20 cm Tube secured with: Tape Dental Injury: Teeth and Oropharynx as per pre-operative assessment

## 2014-03-08 NOTE — H&P (Signed)
BP 129/50 mmHg  Pulse 87  Temp(Src) 98.1 F (36.7 C) (Oral)  Resp 18  Wt 54.432 kg (120 lb)  SpO2 99% Today, I saw [name].  We will go ahead and schedule her kyphoplasty for 01/14.  I would expect a fairly straightforward procedure.  Thanks for the referral.     History of Present Illness:  Holly Jimenez is a woman who presents for evaluation of pain that she has in the mid-thoracic spine.  She fell on 10/11/2013, slipping on a wet floor.  She underwent an x-ray and it showed a compression fracture, T9, which was slight at about 30%.  This  pain has not improved whatsoever since that time.  She is being taken care of by Dr. Porfirio Oar, and it was felt that maybe a kyphoplasty would be helpful, so she is here today to discuss that procedure. She is 69 years of age, right-handed and retired.               Past medical history:  Past medical history significant for hypertension.  She has lupus and CKD stage III.     Surgical history:   She has undergone an appendectomy, a hysterectomy, tonsillectomy.     Allergies:  SHE HAS AN ALLERGY TO SULFA-CONTAINING MEDICATIONS.     Medications:  Alprazolam, aspirin 81 mg, Benicar, hydrocodone, Lidoderm patch, Lyrica, omeprazole, prednisone 5 mg tablet, promethazine, Remeron, trazodone, zolpidem.     Family history:  Mother is deceased, had renal failure.  Father deceased also.  She has had two sisters, colon cancer and stomach cancer.     Social history:  She does not smoke.  She listed no information about alchohol.  She listed no information about substance abuse.     Review of systems:  Positive for arm weakness, back pain, arm pain, neck pain.  She denies constitutional, eye, ear, nose, throat, mouth, cardiovascular, respiratory, gastrointestinal, genitourinary, skin, neurological, psychiatric, endocrine, hematologic, and allergic problems.  She left her pain chart blank.     Physical examination:  Vital signs:  Height 61  inches, weight 118.4 pounds, BMI 22.37.  Blood pressure 122/67, pulse 67, respiratory 14, temperature 98.6 Fahrenheit.  Pain is 5/10.     She is alert and oriented x4, answering all questions appropriately.  Memory, language, attention span and fund of knowledge are normal.  Speech is clear and fluent.  Tongue and uvula midline.  Pupils equal, round and reactive to  light.  Full extraocular movements.  Full visual fields.  Hearing intact to voice.  Uvula elevates to midline.  Shoulder shrug is normal.  Tongue protrudes to midline.  Symmetric facial sensation and movement.  5/5 strength upper and lower extremities.  Normal muscle tone, bulk, coordination.  Negative Romberg test.  2+ reflexes biceps, triceps, brachioradialis, and knees; not elicited at the ankles.  Gait is otherwise normal.     Imaging Studies:  CT of the thoracic spine was reviewed and it is under Jerrell-Peace  She is not under Peace in the hospital system so if ever looking for those films remember to use Jerrell-Peace.  This shows a mild compression fracture at T9.     Diagnosis:  T9 compression fracture.     Impression:  Holly Jimenez, at this point, has not had enough pain relief and she would like to proceed with a kyphoplasty.  I will schedule this for 01/19/2014.  She is having a bout of diarrhea and is going to have further evaluation for  this, along with an unintended weight loss.  She states she is usually around 128 pounds, but found herself today measured at 118 pounds.  I will see her for the kyphoplasty.  Risks and benefits, bleeding, infection, and most importantly, no pain relief she understands and wishes to proceed.

## 2014-03-08 NOTE — Op Note (Signed)
03/08/2014  3:58 PM  PATIENT:  Holly Jimenez  69 y.o. female  PRE-OPERATIVE DIAGNOSIS:  Thoracic nine compression fracture  POST-OPERATIVE DIAGNOSIS:  Thoracic nine compression fracture  PROCEDURE:  Procedure(s): Thoracic nine Kyphoplasty  SURGEON:  Surgeon(s): Ashok Pall, MD  ANESTHESIA:   general  EBL:  Total I/O In: 1000 [I.V.:1000] Out: -   BLOOD ADMINISTERED:none  COUNT:per nursing  SPECIMEN:  No Specimen  DICTATION: Mrs. Bartlett was taken to the operating room, intubated and placed under a general anesthetic without difficulty. She was positioned prone on the operating room table with all pressure points properly padded. Her back was prepped and draped in a sterile manner. With fluoroscopy I localized the T9 pedicles bilaterally. I injected lidocaine into the entry sites on both the left and right sides. I started by making a stab incision on the left side and entering the left pedicle with fluoroscopic guidance. Once good position was obtained, I drilled into the vertebral body. I then placed the kyphoplasty balloon into the T9 vertebra and inflated the balloon. I then inserted 2cc of methylmethacrylate into the vertebral body under fluoroscopic guidance. I achieved a partial fill of the cavity, staying within the confines of the vertebral body.I did the same on the right side at T9. I removed the instrumentation from the vertebral body, and the final films looked good. I closed the stab incision with vicryl suture and used Dermabond for a sterile dressing. Marland Kitchen    PLAN OF CARE: Admit to inpatient   PATIENT DISPOSITION:  PACU - hemodynamically stable.   Delay start of Pharmacological VTE agent (>24hrs) due to surgical blood loss or risk of bleeding:  yes

## 2014-03-08 NOTE — Transfer of Care (Signed)
Immediate Anesthesia Transfer of Care Note  Patient: Holly Jimenez  Procedure(s) Performed: Procedure(s) with comments: Thoracic nine Kyphoplasty (Bilateral) - Thoracic nine Kyphoplasty  Patient Location: PACU  Anesthesia Type:General  Level of Consciousness: awake, alert  and oriented  Airway & Oxygen Therapy: Patient Spontanous Breathing and Patient connected to nasal cannula oxygen  Post-op Assessment: Report given to RN and Post -op Vital signs reviewed and stable  Post vital signs: Reviewed and stable  Last Vitals:  Filed Vitals:   03/08/14 1201  BP: 129/50  Pulse: 87  Temp: 36.7 C  Resp: 18    Complications: No apparent anesthesia complications

## 2014-03-08 NOTE — Anesthesia Postprocedure Evaluation (Signed)
  Anesthesia Post-op Note  Patient: Holly Jimenez  Procedure(s) Performed: Procedure(s) with comments: Thoracic nine Kyphoplasty (Bilateral) - Thoracic nine Kyphoplasty  Patient Location: PACU  Anesthesia Type:General  Level of Consciousness: awake and alert   Airway and Oxygen Therapy: Patient Spontanous Breathing  Post-op Pain: none  Post-op Assessment: Post-op Vital signs reviewed, Patient's Cardiovascular Status Stable and Respiratory Function Stable  Post-op Vital Signs: Reviewed  Filed Vitals:   03/08/14 1740  BP:   Pulse:   Temp: 36.3 C  Resp:     Complications: No apparent anesthesia complications

## 2014-03-08 NOTE — Plan of Care (Signed)
Problem: Consults Goal: Diagnosis - Spinal Surgery Outcome: Completed/Met Date Met:  03/08/14 T9 KYPHOPLASTY

## 2014-03-08 NOTE — Anesthesia Preprocedure Evaluation (Addendum)
Anesthesia Evaluation  Patient identified by MRN, date of birth, ID band Patient awake    Reviewed: Allergy & Precautions, NPO status , Patient's Chart, lab work & pertinent test results  Airway Mallampati: II  TM Distance: >3 FB Neck ROM: Full    Dental  (+) Dental Advisory Given, Missing   Pulmonary former smoker,  breath sounds clear to auscultation        Cardiovascular hypertension, Pt. on medications + Peripheral Vascular Disease Rhythm:Regular Rate:Normal     Neuro/Psych  Headaches, Anxiety    GI/Hepatic Neg liver ROS, PUD, GERD-  ,  Endo/Other  negative endocrine ROS  Renal/GU CRFRenal disease     Musculoskeletal  (+) Arthritis -,   Abdominal   Peds  Hematology  (+) anemia ,   Anesthesia Other Findings   Reproductive/Obstetrics                            Anesthesia Physical Anesthesia Plan  ASA: III  Anesthesia Plan: General   Post-op Pain Management:    Induction: Intravenous  Airway Management Planned: Oral ETT  Additional Equipment:   Intra-op Plan:   Post-operative Plan: Extubation in OR  Informed Consent: I have reviewed the patients History and Physical, chart, labs and discussed the procedure including the risks, benefits and alternatives for the proposed anesthesia with the patient or authorized representative who has indicated his/her understanding and acceptance.     Plan Discussed with: CRNA  Anesthesia Plan Comments:         Anesthesia Quick Evaluation

## 2014-03-09 ENCOUNTER — Encounter (HOSPITAL_COMMUNITY): Payer: Self-pay | Admitting: Neurosurgery

## 2014-03-09 DIAGNOSIS — S22079A Unspecified fracture of T9-T10 vertebra, initial encounter for closed fracture: Secondary | ICD-10-CM | POA: Diagnosis not present

## 2014-03-09 DIAGNOSIS — Z882 Allergy status to sulfonamides status: Secondary | ICD-10-CM | POA: Diagnosis not present

## 2014-03-09 DIAGNOSIS — M329 Systemic lupus erythematosus, unspecified: Secondary | ICD-10-CM | POA: Diagnosis not present

## 2014-03-09 DIAGNOSIS — I129 Hypertensive chronic kidney disease with stage 1 through stage 4 chronic kidney disease, or unspecified chronic kidney disease: Secondary | ICD-10-CM | POA: Diagnosis not present

## 2014-03-09 DIAGNOSIS — N183 Chronic kidney disease, stage 3 (moderate): Secondary | ICD-10-CM | POA: Diagnosis not present

## 2014-03-09 DIAGNOSIS — Z79899 Other long term (current) drug therapy: Secondary | ICD-10-CM | POA: Diagnosis not present

## 2014-03-09 DIAGNOSIS — Z7982 Long term (current) use of aspirin: Secondary | ICD-10-CM | POA: Diagnosis not present

## 2014-03-09 MED ORDER — HYDROCODONE-ACETAMINOPHEN 5-325 MG PO TABS: 1.0000 | ORAL_TABLET | Freq: Four times a day (QID) | ORAL | Status: DC | PRN

## 2014-03-09 MED ORDER — CYCLOBENZAPRINE HCL 10 MG PO TABS: 10.0000 mg | ORAL_TABLET | Freq: Three times a day (TID) | ORAL | Status: DC | PRN

## 2014-03-09 NOTE — Discharge Instructions (Signed)
Care After ?These instructions give you information on caring for yourself after your procedure. Your doctor may also give you more specific instructions. Call your doctor if you have any problems or questions after your procedure. ?HOME CARE ?Take medicine as told by your doctor. ?Keep your wound covered for 24 hours or as told by your doctor. ?Ask your doctor when you can bathe or shower. ?Put ice on your wound if it helps your pain. ?Put ice in a plastic bag. ?Place a towel between your skin and the bag. ?Leave the ice on for 15 to 20 minutes, 3 to 4 times a day. ? ?Return to normal activities as told by your doctor. ?Ask your doctor what stretches and exercises you can do. ?Do not bend or lift anything heavy as told by your doctor. ?GET HELP RIGHT AWAY IF:  ?You have bad back pain that comes on suddenly. ?You cannot control when you pee (urinate) or poop (bowel movement). ?You lose feeling (numbness) in your legs or feet, or they become weak. ?You have shooting pain down your legs. ?You have a fever. ?Your wound becomes red, puffy (swollen), or tender to the touch. ?You are bleeding or leaking fluid from the wound. ?You are sick to your stomach (nauseous) or throw up (vomit) for more than 24 hours after the procedure. ?Your back pain does not get better. ?MAKE SURE YOU: ?Understand these instructions. ?Will watch your condition. ?Will get help right away if you are not doing well or get worse. ?Document Released: 03/19/2009 Document Revised: 03/17/2011 Document Reviewed: 05/29/2010 ?ExitCare? Patient Information ?2013 [employer].  ?

## 2014-03-09 NOTE — Discharge Summary (Signed)
Physician Discharge Summary  Patient ID: RUTHELMA BESCH MRN: [id number] DOB/AGE: [birth year] 69 y.o.  Admit date: 03/08/2014 Discharge date: 03/09/2014  Admission Diagnoses:T9 compression fracture  Discharge Diagnoses: T9 compression fracture Active Problems:   Compression fracture of body of thoracic vertebra   Discharged Condition: good  Hospital Course: Holly Jimenez is a 69 y.o. female Whom was admitted for a T9 compression fracture. She underwent an uncomplicated Kyphoplasty at T9. Post op she is better on her left side which had been worse, but now feels pain in the right. She is ambulating, voiding, and tolerating a regular diet. Her wounds are clean,dry, and without signs of infection.   Treatments: surgery: as above  Discharge Exam: Blood pressure 136/48, pulse 67, temperature 98.7 F (37.1 C), temperature source Oral, resp. rate 16, weight 54.432 kg (120 lb), SpO2 97 %. General appearance: alert, cooperative, appears stated age and mild distress Neurologic: Alert and oriented X 3, normal strength and tone. Normal symmetric reflexes. Normal coordination and gait  Disposition: 01-Home or Self Care Thoracic nine compression fracture    Medication List    TAKE these medications        ALPRAZolam 0.25 MG tablet  Commonly known as:  XANAX  Take 0.25 mg by mouth 3 (three) times daily as needed for anxiety.     aspirin EC 81 MG tablet  Take 81 mg by mouth daily.     BENICAR 5 MG tablet  Generic drug:  olmesartan  Take 5 mg by mouth daily.     cetirizine 5 MG tablet  Commonly known as:  ZYRTEC  Take 5 mg by mouth daily as needed for allergies.     cholecalciferol 1000 UNITS tablet  Commonly known as:  VITAMIN D  Take 1,000 Units by mouth daily.     cyclobenzaprine 10 MG tablet  Commonly known as:  FLEXERIL  Take 1 tablet (10 mg total) by mouth 3 (three) times daily as needed for muscle spasms.     ferrous gluconate 240 (27 FE) MG tablet   Commonly known as:  FERGON  Take 240 mg by mouth 2 (two) times daily.     HYDROcodone-acetaminophen 5-325 MG per tablet  Commonly known as:  NORCO/VICODIN  Take 1 tablet by mouth every 6 (six) hours as needed for moderate pain.     HYDROcodone-acetaminophen 5-325 MG per tablet  Commonly known as:  NORCO/VICODIN  Take 1 tablet by mouth every 6 (six) hours as needed for moderate pain.     hydroxychloroquine 200 MG tablet  Commonly known as:  PLAQUENIL  Take 200 mg by mouth 2 (two) times daily.     ketoconazole 2 % cream  Commonly known as:  NIZORAL  Apply 1 application topically daily as needed (infection treatment).     lidocaine 5 %  Commonly known as:  LIDODERM  Place 1 patch onto the skin daily as needed (pain). Remove & Discard patch within 12 hours or as directed by MD     mirtazapine 15 MG tablet  Commonly known as:  REMERON  Take 7.5 mg by mouth at bedtime.     multivitamin with minerals Tabs tablet  Take 1 tablet by mouth daily.     omeprazole 20 MG capsule  Commonly known as:  PRILOSEC  Take 20 mg by mouth 2 (two) times daily.     predniSONE 5 MG tablet  Commonly known as:  DELTASONE  Take 5 mg by mouth daily. continuous  pregabalin 75 MG capsule  Commonly known as:  LYRICA  Take 75 mg by mouth 2 (two) times daily.     promethazine 12.5 MG suppository  Commonly known as:  PHENERGAN  Place 1 suppository (12.5 mg total) rectally every 6 (six) hours as needed for nausea.     PHENADOZ 25 MG suppository  Generic drug:  promethazine  Place 25 mg rectally every 6 (six) hours as needed for nausea or vomiting.     pyridOXINE 100 MG tablet  Commonly known as:  VITAMIN B-6  Take 100 mg by mouth daily.     traZODone 50 MG tablet  Commonly known as:  DESYREL  Take 50 mg by mouth at bedtime.     VISINE OP  Place 2 drops into both eyes as needed (for dry eyes).     vitamin B-12 1000 MCG tablet  Commonly known as:  CYANOCOBALAMIN  Take 1,000 mcg by mouth  daily.     zolpidem 10 MG tablet  Commonly known as:  AMBIEN  Take 10 mg by mouth at bedtime as needed for sleep.           Follow-up Information    Follow up with Boleslaus Holloway L, MD In 3 weeks.   Specialty:  Neurosurgery   Why:  call office to make an appointment   Contact information:   [address] [place] [address] [phone]       Signed: Winfield Cunas 03/09/2014, 3:58 PM

## 2014-03-09 NOTE — Progress Notes (Signed)
Pt doing well. Pt given D/C instructions with Rx's, verbal understanding was provided. Pt's IV was removed prior to D/C. Pt's incision is clean and dry with no sign of infection. Pt D/C'd home via wheelchair @ 1805 per MD order. Pt is stable @ D/C and has no other needs at this time. Holli Humbles, RN

## 2014-03-23 DIAGNOSIS — M810 Age-related osteoporosis without current pathological fracture: Secondary | ICD-10-CM | POA: Diagnosis not present

## 2014-03-30 DIAGNOSIS — S22000G Wedge compression fracture of unspecified thoracic vertebra, subsequent encounter for fracture with delayed healing: Secondary | ICD-10-CM | POA: Diagnosis not present

## 2014-04-04 DIAGNOSIS — Z79899 Other long term (current) drug therapy: Secondary | ICD-10-CM | POA: Diagnosis not present

## 2014-04-04 DIAGNOSIS — H2513 Age-related nuclear cataract, bilateral: Secondary | ICD-10-CM | POA: Diagnosis not present

## 2014-04-04 DIAGNOSIS — H35033 Hypertensive retinopathy, bilateral: Secondary | ICD-10-CM | POA: Diagnosis not present

## 2014-04-18 DIAGNOSIS — S22000D Wedge compression fracture of unspecified thoracic vertebra, subsequent encounter for fracture with routine healing: Secondary | ICD-10-CM | POA: Diagnosis not present

## 2014-04-18 DIAGNOSIS — M358 Other specified systemic involvement of connective tissue: Secondary | ICD-10-CM | POA: Diagnosis not present

## 2014-04-18 DIAGNOSIS — M5410 Radiculopathy, site unspecified: Secondary | ICD-10-CM | POA: Diagnosis not present

## 2014-04-18 DIAGNOSIS — M5136 Other intervertebral disc degeneration, lumbar region: Secondary | ICD-10-CM | POA: Diagnosis not present

## 2014-05-18 DIAGNOSIS — M25552 Pain in left hip: Secondary | ICD-10-CM | POA: Diagnosis not present

## 2014-05-18 DIAGNOSIS — M87052 Idiopathic aseptic necrosis of left femur: Secondary | ICD-10-CM | POA: Diagnosis not present

## 2014-07-03 ENCOUNTER — Other Ambulatory Visit: Payer: Self-pay

## 2014-08-08 DIAGNOSIS — I10 Essential (primary) hypertension: Secondary | ICD-10-CM | POA: Diagnosis not present

## 2014-08-08 DIAGNOSIS — M859 Disorder of bone density and structure, unspecified: Secondary | ICD-10-CM | POA: Diagnosis not present

## 2014-08-10 DIAGNOSIS — M358 Other specified systemic involvement of connective tissue: Secondary | ICD-10-CM | POA: Diagnosis not present

## 2014-08-10 DIAGNOSIS — M5136 Other intervertebral disc degeneration, lumbar region: Secondary | ICD-10-CM | POA: Diagnosis not present

## 2014-08-10 DIAGNOSIS — S22000D Wedge compression fracture of unspecified thoracic vertebra, subsequent encounter for fracture with routine healing: Secondary | ICD-10-CM | POA: Diagnosis not present

## 2014-08-10 DIAGNOSIS — M5410 Radiculopathy, site unspecified: Secondary | ICD-10-CM | POA: Diagnosis not present

## 2014-08-29 DIAGNOSIS — M859 Disorder of bone density and structure, unspecified: Secondary | ICD-10-CM | POA: Diagnosis not present

## 2014-08-29 DIAGNOSIS — M359 Systemic involvement of connective tissue, unspecified: Secondary | ICD-10-CM | POA: Diagnosis not present

## 2014-08-29 DIAGNOSIS — N183 Chronic kidney disease, stage 3 (moderate): Secondary | ICD-10-CM | POA: Diagnosis not present

## 2014-08-29 DIAGNOSIS — I10 Essential (primary) hypertension: Secondary | ICD-10-CM | POA: Diagnosis not present

## 2014-08-31 DIAGNOSIS — Z23 Encounter for immunization: Secondary | ICD-10-CM | POA: Diagnosis not present

## 2014-09-25 ENCOUNTER — Encounter: Payer: Self-pay | Admitting: Gastroenterology

## 2014-10-05 DIAGNOSIS — H35033 Hypertensive retinopathy, bilateral: Secondary | ICD-10-CM | POA: Diagnosis not present

## 2014-10-05 DIAGNOSIS — H2513 Age-related nuclear cataract, bilateral: Secondary | ICD-10-CM | POA: Diagnosis not present

## 2014-10-05 DIAGNOSIS — Z79899 Other long term (current) drug therapy: Secondary | ICD-10-CM | POA: Diagnosis not present

## 2014-12-11 DIAGNOSIS — R05 Cough: Secondary | ICD-10-CM | POA: Diagnosis not present

## 2014-12-11 DIAGNOSIS — J069 Acute upper respiratory infection, unspecified: Secondary | ICD-10-CM | POA: Diagnosis not present

## 2014-12-11 DIAGNOSIS — J40 Bronchitis, not specified as acute or chronic: Secondary | ICD-10-CM | POA: Diagnosis not present

## 2014-12-19 DIAGNOSIS — M5441 Lumbago with sciatica, right side: Secondary | ICD-10-CM | POA: Diagnosis not present

## 2014-12-19 DIAGNOSIS — R42 Dizziness and giddiness: Secondary | ICD-10-CM | POA: Diagnosis not present

## 2014-12-19 DIAGNOSIS — M5136 Other intervertebral disc degeneration, lumbar region: Secondary | ICD-10-CM | POA: Diagnosis not present

## 2014-12-19 DIAGNOSIS — Z5181 Encounter for therapeutic drug level monitoring: Secondary | ICD-10-CM | POA: Diagnosis not present

## 2014-12-19 DIAGNOSIS — G4700 Insomnia, unspecified: Secondary | ICD-10-CM | POA: Diagnosis not present

## 2014-12-19 DIAGNOSIS — F1920 Other psychoactive substance dependence, uncomplicated: Secondary | ICD-10-CM | POA: Diagnosis not present

## 2014-12-19 DIAGNOSIS — I10 Essential (primary) hypertension: Secondary | ICD-10-CM | POA: Diagnosis not present

## 2014-12-19 DIAGNOSIS — F419 Anxiety disorder, unspecified: Secondary | ICD-10-CM | POA: Diagnosis not present

## 2015-01-17 DIAGNOSIS — N390 Urinary tract infection, site not specified: Secondary | ICD-10-CM | POA: Diagnosis not present

## 2015-01-17 DIAGNOSIS — M542 Cervicalgia: Secondary | ICD-10-CM | POA: Diagnosis not present

## 2015-01-17 DIAGNOSIS — R42 Dizziness and giddiness: Secondary | ICD-10-CM | POA: Diagnosis not present

## 2015-01-17 DIAGNOSIS — Z1389 Encounter for screening for other disorder: Secondary | ICD-10-CM | POA: Diagnosis not present

## 2015-02-12 DIAGNOSIS — M79643 Pain in unspecified hand: Secondary | ICD-10-CM | POA: Diagnosis not present

## 2015-02-12 DIAGNOSIS — S63062A Subluxation of metacarpal (bone), proximal end of left hand, initial encounter: Secondary | ICD-10-CM | POA: Diagnosis not present

## 2015-02-12 DIAGNOSIS — S22000D Wedge compression fracture of unspecified thoracic vertebra, subsequent encounter for fracture with routine healing: Secondary | ICD-10-CM | POA: Diagnosis not present

## 2015-02-12 DIAGNOSIS — M19011 Primary osteoarthritis, right shoulder: Secondary | ICD-10-CM | POA: Diagnosis not present

## 2015-02-12 DIAGNOSIS — M19012 Primary osteoarthritis, left shoulder: Secondary | ICD-10-CM | POA: Diagnosis not present

## 2015-02-12 DIAGNOSIS — M5410 Radiculopathy, site unspecified: Secondary | ICD-10-CM | POA: Diagnosis not present

## 2015-02-12 DIAGNOSIS — M5136 Other intervertebral disc degeneration, lumbar region: Secondary | ICD-10-CM | POA: Diagnosis not present

## 2015-02-12 DIAGNOSIS — M25519 Pain in unspecified shoulder: Secondary | ICD-10-CM | POA: Diagnosis not present

## 2015-02-12 DIAGNOSIS — S63061A Subluxation of metacarpal (bone), proximal end of right hand, initial encounter: Secondary | ICD-10-CM | POA: Diagnosis not present

## 2015-02-12 DIAGNOSIS — M358 Other specified systemic involvement of connective tissue: Secondary | ICD-10-CM | POA: Diagnosis not present

## 2015-02-26 DIAGNOSIS — M358 Other specified systemic involvement of connective tissue: Secondary | ICD-10-CM | POA: Diagnosis not present

## 2015-02-26 DIAGNOSIS — I10 Essential (primary) hypertension: Secondary | ICD-10-CM | POA: Diagnosis not present

## 2015-02-26 DIAGNOSIS — M859 Disorder of bone density and structure, unspecified: Secondary | ICD-10-CM | POA: Diagnosis not present

## 2015-02-26 DIAGNOSIS — N390 Urinary tract infection, site not specified: Secondary | ICD-10-CM | POA: Diagnosis not present

## 2015-02-28 DIAGNOSIS — M12812 Other specific arthropathies, not elsewhere classified, left shoulder: Secondary | ICD-10-CM | POA: Diagnosis not present

## 2015-02-28 DIAGNOSIS — M12811 Other specific arthropathies, not elsewhere classified, right shoulder: Secondary | ICD-10-CM | POA: Diagnosis not present

## 2015-03-01 DIAGNOSIS — I10 Essential (primary) hypertension: Secondary | ICD-10-CM | POA: Diagnosis not present

## 2015-03-01 DIAGNOSIS — D649 Anemia, unspecified: Secondary | ICD-10-CM | POA: Diagnosis not present

## 2015-03-01 DIAGNOSIS — N183 Chronic kidney disease, stage 3 (moderate): Secondary | ICD-10-CM | POA: Diagnosis not present

## 2015-03-06 DIAGNOSIS — N183 Chronic kidney disease, stage 3 (moderate): Secondary | ICD-10-CM | POA: Diagnosis not present

## 2015-03-08 DIAGNOSIS — E875 Hyperkalemia: Secondary | ICD-10-CM | POA: Diagnosis not present

## 2015-03-08 DIAGNOSIS — I129 Hypertensive chronic kidney disease with stage 1 through stage 4 chronic kidney disease, or unspecified chronic kidney disease: Secondary | ICD-10-CM | POA: Diagnosis not present

## 2015-03-08 DIAGNOSIS — M329 Systemic lupus erythematosus, unspecified: Secondary | ICD-10-CM | POA: Diagnosis not present

## 2015-03-08 DIAGNOSIS — N189 Chronic kidney disease, unspecified: Secondary | ICD-10-CM | POA: Diagnosis not present

## 2015-03-08 DIAGNOSIS — I701 Atherosclerosis of renal artery: Secondary | ICD-10-CM | POA: Diagnosis not present

## 2015-03-08 DIAGNOSIS — N179 Acute kidney failure, unspecified: Secondary | ICD-10-CM | POA: Diagnosis not present

## 2015-03-08 DIAGNOSIS — N183 Chronic kidney disease, stage 3 (moderate): Secondary | ICD-10-CM | POA: Diagnosis not present

## 2015-03-08 DIAGNOSIS — D631 Anemia in chronic kidney disease: Secondary | ICD-10-CM | POA: Diagnosis not present

## 2015-03-12 DIAGNOSIS — S22000D Wedge compression fracture of unspecified thoracic vertebra, subsequent encounter for fracture with routine healing: Secondary | ICD-10-CM | POA: Diagnosis not present

## 2015-03-12 DIAGNOSIS — M5136 Other intervertebral disc degeneration, lumbar region: Secondary | ICD-10-CM | POA: Diagnosis not present

## 2015-03-12 DIAGNOSIS — M5410 Radiculopathy, site unspecified: Secondary | ICD-10-CM | POA: Diagnosis not present

## 2015-03-12 DIAGNOSIS — M358 Other specified systemic involvement of connective tissue: Secondary | ICD-10-CM | POA: Diagnosis not present

## 2015-03-16 DIAGNOSIS — N183 Chronic kidney disease, stage 3 (moderate): Secondary | ICD-10-CM | POA: Diagnosis not present

## 2015-03-28 DIAGNOSIS — M12812 Other specific arthropathies, not elsewhere classified, left shoulder: Secondary | ICD-10-CM | POA: Diagnosis not present

## 2015-03-28 DIAGNOSIS — M12811 Other specific arthropathies, not elsewhere classified, right shoulder: Secondary | ICD-10-CM | POA: Diagnosis not present

## 2015-04-05 DIAGNOSIS — Z79899 Other long term (current) drug therapy: Secondary | ICD-10-CM | POA: Diagnosis not present

## 2015-04-05 DIAGNOSIS — H2513 Age-related nuclear cataract, bilateral: Secondary | ICD-10-CM | POA: Diagnosis not present

## 2015-04-18 ENCOUNTER — Other Ambulatory Visit: Payer: Self-pay

## 2015-04-18 DIAGNOSIS — Z1231 Encounter for screening mammogram for malignant neoplasm of breast: Secondary | ICD-10-CM

## 2015-05-01 ENCOUNTER — Ambulatory Visit
Admission: RE | Admit: 2015-05-01 | Discharge: 2015-05-01 | Disposition: A | Discharge status: Home / Self Care | Payer: PPO | Source: Ambulatory Visit

## 2015-05-01 DIAGNOSIS — Z1231 Encounter for screening mammogram for malignant neoplasm of breast: Secondary | ICD-10-CM

## 2015-05-18 DIAGNOSIS — M5410 Radiculopathy, site unspecified: Secondary | ICD-10-CM | POA: Diagnosis not present

## 2015-05-18 DIAGNOSIS — M5136 Other intervertebral disc degeneration, lumbar region: Secondary | ICD-10-CM | POA: Diagnosis not present

## 2015-05-18 DIAGNOSIS — S22000D Wedge compression fracture of unspecified thoracic vertebra, subsequent encounter for fracture with routine healing: Secondary | ICD-10-CM | POA: Diagnosis not present

## 2015-05-18 DIAGNOSIS — M358 Other specified systemic involvement of connective tissue: Secondary | ICD-10-CM | POA: Diagnosis not present

## 2015-05-22 DIAGNOSIS — D649 Anemia, unspecified: Secondary | ICD-10-CM | POA: Diagnosis not present

## 2015-05-29 DIAGNOSIS — N183 Chronic kidney disease, stage 3 (moderate): Secondary | ICD-10-CM | POA: Diagnosis not present

## 2015-05-29 DIAGNOSIS — I10 Essential (primary) hypertension: Secondary | ICD-10-CM | POA: Diagnosis not present

## 2015-05-29 DIAGNOSIS — K7689 Other specified diseases of liver: Secondary | ICD-10-CM | POA: Diagnosis not present

## 2015-05-29 DIAGNOSIS — R946 Abnormal results of thyroid function studies: Secondary | ICD-10-CM | POA: Diagnosis not present

## 2015-06-20 DIAGNOSIS — R946 Abnormal results of thyroid function studies: Secondary | ICD-10-CM | POA: Diagnosis not present

## 2015-06-20 DIAGNOSIS — I10 Essential (primary) hypertension: Secondary | ICD-10-CM | POA: Diagnosis not present

## 2015-06-27 ENCOUNTER — Other Ambulatory Visit: Payer: Self-pay | Admitting: Internal Medicine

## 2015-06-27 DIAGNOSIS — R1013 Epigastric pain: Secondary | ICD-10-CM | POA: Diagnosis not present

## 2015-06-27 DIAGNOSIS — R945 Abnormal results of liver function studies: Secondary | ICD-10-CM

## 2015-06-27 DIAGNOSIS — M859 Disorder of bone density and structure, unspecified: Secondary | ICD-10-CM | POA: Diagnosis not present

## 2015-06-27 DIAGNOSIS — Z0000 Encounter for general adult medical examination without abnormal findings: Secondary | ICD-10-CM | POA: Diagnosis not present

## 2015-06-27 DIAGNOSIS — N183 Chronic kidney disease, stage 3 (moderate): Secondary | ICD-10-CM | POA: Diagnosis not present

## 2015-06-27 DIAGNOSIS — I10 Essential (primary) hypertension: Secondary | ICD-10-CM | POA: Diagnosis not present

## 2015-07-16 ENCOUNTER — Ambulatory Visit
Admission: RE | Admit: 2015-07-16 | Discharge: 2015-07-16 | Disposition: A | Discharge status: Home / Self Care | Payer: PPO | Source: Ambulatory Visit | Attending: Internal Medicine | Admitting: Internal Medicine

## 2015-07-16 DIAGNOSIS — R945 Abnormal results of liver function studies: Secondary | ICD-10-CM | POA: Diagnosis not present

## 2015-07-16 DIAGNOSIS — I701 Atherosclerosis of renal artery: Secondary | ICD-10-CM | POA: Diagnosis not present

## 2015-07-16 DIAGNOSIS — D631 Anemia in chronic kidney disease: Secondary | ICD-10-CM | POA: Diagnosis not present

## 2015-07-16 DIAGNOSIS — M329 Systemic lupus erythematosus, unspecified: Secondary | ICD-10-CM | POA: Diagnosis not present

## 2015-07-16 DIAGNOSIS — E875 Hyperkalemia: Secondary | ICD-10-CM | POA: Diagnosis not present

## 2015-07-16 DIAGNOSIS — I129 Hypertensive chronic kidney disease with stage 1 through stage 4 chronic kidney disease, or unspecified chronic kidney disease: Secondary | ICD-10-CM | POA: Diagnosis not present

## 2015-07-16 DIAGNOSIS — N179 Acute kidney failure, unspecified: Secondary | ICD-10-CM | POA: Diagnosis not present

## 2015-07-16 DIAGNOSIS — N183 Chronic kidney disease, stage 3 (moderate): Secondary | ICD-10-CM | POA: Diagnosis not present

## 2015-07-23 DIAGNOSIS — K7689 Other specified diseases of liver: Secondary | ICD-10-CM | POA: Diagnosis not present

## 2015-07-24 ENCOUNTER — Encounter: Payer: Self-pay | Admitting: Interventional Cardiology

## 2015-07-26 DIAGNOSIS — R634 Abnormal weight loss: Secondary | ICD-10-CM | POA: Diagnosis not present

## 2015-07-26 DIAGNOSIS — I10 Essential (primary) hypertension: Secondary | ICD-10-CM | POA: Diagnosis not present

## 2015-07-26 DIAGNOSIS — R918 Other nonspecific abnormal finding of lung field: Secondary | ICD-10-CM | POA: Diagnosis not present

## 2015-07-26 DIAGNOSIS — M859 Disorder of bone density and structure, unspecified: Secondary | ICD-10-CM | POA: Diagnosis not present

## 2015-08-07 ENCOUNTER — Encounter: Payer: Self-pay | Admitting: Gastroenterology

## 2015-08-07 ENCOUNTER — Ambulatory Visit (INDEPENDENT_AMBULATORY_CARE_PROVIDER_SITE_OTHER): Payer: PPO | Admitting: Gastroenterology

## 2015-08-07 ENCOUNTER — Other Ambulatory Visit (INDEPENDENT_AMBULATORY_CARE_PROVIDER_SITE_OTHER): Payer: PPO

## 2015-08-07 VITALS — BP 164/80 | HR 80 | Ht 61.0 in | Wt 112.0 lb

## 2015-08-07 DIAGNOSIS — R1031 Right lower quadrant pain: Secondary | ICD-10-CM

## 2015-08-07 LAB — COMPREHENSIVE METABOLIC PANEL WITH GFR
ALT: 28 U/L (ref 0–35)
AST: 35 U/L (ref 0–37)
Albumin: 3.9 g/dL (ref 3.5–5.2)
Alkaline Phosphatase: 63 U/L (ref 39–117)
BUN: 20 mg/dL (ref 6–23)
CO2: 28 meq/L (ref 19–32)
Calcium: 9.5 mg/dL (ref 8.4–10.5)
Chloride: 106 meq/L (ref 96–112)
Creatinine, Ser: 1.29 mg/dL — ABNORMAL HIGH (ref 0.40–1.20)
GFR: 52.27 mL/min — ABNORMAL LOW (ref >60.00)
Glucose, Bld: 100 mg/dL — ABNORMAL HIGH (ref 70–99)
Potassium: 4.2 meq/L (ref 3.5–5.1)
Sodium: 141 meq/L (ref 135–145)
Total Bilirubin: 0.3 mg/dL (ref 0.2–1.2)
Total Protein: 7 g/dL (ref 6.0–8.3)

## 2015-08-07 LAB — CBC WITH DIFFERENTIAL/PLATELET
BASOS ABS: 0 10*3/uL (ref 0.0–0.1)
BASOS PCT: 0.4 % (ref 0.0–3.0)
Eosinophils Absolute: 0.1 10*3/uL (ref 0.0–0.7)
Eosinophils Relative: 0.5 % (ref 0.0–5.0)
HEMATOCRIT: 34.2 % — AB (ref 36.0–46.0)
HEMOGLOBIN: 11 g/dL — AB (ref 12.0–15.0)
LYMPHS PCT: 21.2 % (ref 12.0–46.0)
Lymphs Abs: 2 10*3/uL (ref 0.7–4.0)
MCHC: 32.1 g/dL (ref 30.0–36.0)
MCV: 84.9 fl (ref 78.0–100.0)
Monocytes Absolute: 0.6 10*3/uL (ref 0.1–1.0)
Monocytes Relative: 6.1 % (ref 3.0–12.0)
Neutro Abs: 6.9 10*3/uL (ref 1.4–7.7)
Neutrophils Relative %: 71.8 % (ref 43.0–77.0)
Platelets: 248 10*3/uL (ref 150.0–400.0)
RBC: 4.03 Mil/uL (ref 3.87–5.11)
RDW: 15.1 % (ref 11.5–15.5)
WBC: 9.6 10*3/uL (ref 4.0–10.5)

## 2015-08-07 MED ORDER — NA SULFATE-K SULFATE-MG SULF 17.5-3.13-1.6 GM/177ML PO SOLN: 1.0000 | Freq: Once | ORAL | 0 refills | Status: AC

## 2015-08-07 NOTE — Progress Notes (Signed)
Review of pertinent gastrointestinal problems: 1. Chronic constipation. Right lower quadrant pain improved when she moves her bowels. Colonoscopy, Vella Kohler, 2000. No polyps seen. Colonoscopy, incomplete, 2007, Dr. Wynetta Emery, followed by a virtual colonoscopy in 2007, followed by a barium enema in 2007. These were all essentially normal. The virtual colonoscopy suggested some possible small polyps. Dr. Wynetta Emery was planning to repeat that examination at one year interval.  Colonsocopy 09/2008 (Itzia Cunliffe, complete); normal colonoscopy; recommended recall at 10 year interval 2. Mild to moderate gastritis, EGD January 2009. CLO testing negative.   Repeat EGD 11/2011 for chronic nausea showed the same, biopses again negative for infection. Follow up CT scan 12/2011 was essentially negative.   HPI: This is a very pleasant 70 year old woman  who was referred to me by Jani Gravel, MD  to evaluate  right lower quadrant pain .    Chief complaint is right lower quadrant and also epigastric pains  CT 01/2014 essentially normal [place] 07/2015 for abnormal liver tests: normal:    RLQ pains for 4 months also epigastric.  The RLQ pain is a gas like pain. The epigastric pain is stabbing. They usually occur together.  These can happen 3 times a day.  Usually lasts 30 min or so.  Laying down usually helps.  Moving her bowels does not help. Eating does not tend to bring it on. Her nausea does not get worse around the time of this pain. No positions seem to cause the pain.  She still has nausea times.  She believes she's still losing weight. (down 15 pounds since ROV here 3 years ago)  She moves her bowels pretty regularly, never constipated.  Never overt bleeding.     Review of systems: Pertinent positive and negative review of systems were noted in the above HPI section. Complete review of systems was performed and was otherwise normal.   Past Medical History:  Diagnosis Date  . Abnormal LFTs   . Allergic  rhinitis   . Anemia   . Anxiety   . Arthritis   . Chronic back pain   . Chronic headaches   . Chronic kidney disease, unspecified ([facility])   . DDD (degenerative disc disease)   . GERD (gastroesophageal reflux disease)   . Hypercalcemia   . Hyperkalemia   . Hyperlipidemia   . Hypertension    KIDNEY SPECIALIST TOOK OFF BENICAR  . IBS (irritable bowel syndrome)   . Lupus ([place])   . Mixed connective tissue disease ([place])   . Nausea   . Neuropathy ([place])   . Peptic ulcer disease   . Raynaud's disease   . Small kidney    left  . Vitamin D deficiency disease     Past Surgical History:  Procedure Laterality Date  . ABDOMINAL HYSTERECTOMY  1975  . APPENDECTOMY  1972  . FOOT SURGERY     LEFT  . KYPHOPLASTY Bilateral 03/08/2014   Procedure: Thoracic nine Kyphoplasty;  Surgeon: Ashok Pall, MD;  Location: [facility];  Service: Neurosurgery;  Laterality: Bilateral;  Thoracic nine Kyphoplasty  . LIPOMA EXCISION     back  . TONSILLECTOMY    . WRIST GANGLION EXCISION     left    Current Outpatient Prescriptions  Medication Sig Dispense Refill  . ALPRAZolam (XANAX) 0.25 MG tablet Take 0.25 mg by mouth 3 (three) times daily as needed for anxiety.     Marland Kitchen aspirin EC 81 MG tablet Take 81 mg by mouth daily.    Marland Kitchen BENICAR 5 MG  tablet Take 5 mg by mouth daily.   0  . cetirizine (ZYRTEC) 5 MG tablet Take 5 mg by mouth daily as needed for allergies.     . cholecalciferol (VITAMIN D) 1000 UNITS tablet Take 1,000 Units by mouth daily.    . ferrous gluconate (FERGON) 240 (27 FE) MG tablet Take 240 mg by mouth 2 (two) times daily.    Marland Kitchen HYDROcodone-acetaminophen (NORCO/VICODIN) 5-325 MG per tablet Take 1 tablet by mouth every 6 (six) hours as needed for moderate pain.    . hydroxychloroquine (PLAQUENIL) 200 MG tablet Take 200 mg by mouth 2 (two) times daily.     Marland Kitchen ketoconazole (NIZORAL) 2 % cream Apply 1 application topically daily as needed (infection treatment).     Marland Kitchen lidocaine (LIDODERM) 5 % Place 1  patch onto the skin daily as needed (pain). Remove & Discard patch within 12 hours or as directed by MD    . Multiple Vitamin (MULTIVITAMIN WITH MINERALS) TABS tablet Take 1 tablet by mouth daily.    Marland Kitchen omeprazole (PRILOSEC) 20 MG capsule Take 20 mg by mouth 2 (two) times daily.     Marland Kitchen PHENADOZ 25 MG suppository Place 25 mg rectally every 6 (six) hours as needed for nausea or vomiting.   0  . predniSONE (DELTASONE) 5 MG tablet Take 5 mg by mouth daily. continuous    . pregabalin (LYRICA) 75 MG capsule Take 75 mg by mouth 2 (two) times daily.    Marland Kitchen pyridOXINE (VITAMIN B-6) 100 MG tablet Take 100 mg by mouth daily.    . Tetrahydrozoline HCl (VISINE OP) Place 2 drops into both eyes as needed (for dry eyes).    . traZODone (DESYREL) 50 MG tablet Take 50 mg by mouth at bedtime.    . vitamin B-12 (CYANOCOBALAMIN) 1000 MCG tablet Take 1,000 mcg by mouth daily.     No current facility-administered medications for this visit.     Allergies as of 08/07/2015 - Review Complete 08/07/2015  Allergen Reaction Noted  . Sulfonamide derivatives Hives     Family History  Problem Relation Age of Onset  . Ovarian cancer Sister   . Colon cancer Sister   . Heart disease Paternal Grandfather   . Heart disease Maternal Grandfather   . Kidney disease Brother   . Kidney disease Mother   . Kidney disease Maternal Grandmother   . Diabetes Paternal Grandmother     Social History   Social History  . Marital status: Divorced    Spouse name: N/A  . Number of children: 1  . Years of education: N/A   Occupational History  . retired [employer]   Social History Main Topics  . Smoking status: Former Smoker    Packs/day: 1.00    Years: 25.00    Types: Cigarettes    Quit date: 03/05/2000  . Smokeless tobacco: Never Used  . Alcohol use Yes     Comment: RAREly  . Drug use:     Frequency: 2.0 times per week     Comment: marijuana for pain   last few days  . Sexual activity: Not on file   Other  Topics Concern  . Not on file   Social History Narrative  . No narrative on file     Physical Exam: BP (!) 164/80   Pulse 80   Ht 5\' 1"  (1.549 m)   Wt 112 lb (50.8 kg)   BMI 21.16 kg/m  Constitutional: generally well-appearing Psychiatric: alert and oriented x3  Eyes: extraocular movements intact Mouth: oral pharynx moist, no lesions Neck: supple no lymphadenopathy Cardiovascular: heart regular rate and rhythm Lungs: clear to auscultation bilaterally Abdomen: soft, nontender, nondistended, no obvious ascites, no peritoneal signs, normal bowel sounds Extremities: no lower extremity edema bilaterally Skin: no lesions on visible extremities   Assessment and plan: 71 y.o. female with  right lower quadrant pain, epigastric pain, weight loss  She has lost 15 pounds the past 3 years. Currently her abdominal symptoms are pretty vague actually. She had a CAT scan about a year and a half ago, abdominal ultrasound last month. She'll get a repeat set of blood work today including a CBC and complete metabolic profile. She tells me her primary care physician was hoping that she would have an upper endoscopy and also colonoscopy. She had she had an upper endoscopy just about 3 years ago for abdominal pains. Nausea. Was pretty unrevealing then I'll think it needs to be necessary repeated now since that epigastric pain is chronic to me. I did recommend repeat colonoscopy. Has been about 7 years since her last one and it may help explain her right lower quadrant pains now.   Owens Loffler, MD [facility] 08/07/2015, 3:57 PM  Cc: Jani Gravel, MD

## 2015-08-07 NOTE — Patient Instructions (Signed)
You will be set up for a colonoscopy for RLQ pain, weight loss. You will have labs checked today in the basement lab.  Please head down after you check out with the front desk  (cbc, cmet)

## 2015-08-23 DIAGNOSIS — M359 Systemic involvement of connective tissue, unspecified: Secondary | ICD-10-CM | POA: Diagnosis not present

## 2015-08-23 DIAGNOSIS — R634 Abnormal weight loss: Secondary | ICD-10-CM | POA: Diagnosis not present

## 2015-08-23 DIAGNOSIS — N183 Chronic kidney disease, stage 3 (moderate): Secondary | ICD-10-CM | POA: Diagnosis not present

## 2015-08-23 DIAGNOSIS — I10 Essential (primary) hypertension: Secondary | ICD-10-CM | POA: Diagnosis not present

## 2015-09-05 ENCOUNTER — Other Ambulatory Visit: Payer: Self-pay | Admitting: Pharmacist

## 2015-09-05 NOTE — Patient Outreach (Signed)
Outreach call to [employer] regarding her request for follow up from the [facility] Medication Adherence Campaign. Left a HIPAA compliant message on the patient's voicemail.   Harlow Asa, PharmD Clinical Pharmacist [facility] [phone]

## 2015-09-11 ENCOUNTER — Encounter: Payer: Self-pay | Admitting: Gastroenterology

## 2015-09-18 DIAGNOSIS — M358 Other specified systemic involvement of connective tissue: Secondary | ICD-10-CM | POA: Diagnosis not present

## 2015-09-18 DIAGNOSIS — M810 Age-related osteoporosis without current pathological fracture: Secondary | ICD-10-CM | POA: Diagnosis not present

## 2015-09-18 DIAGNOSIS — Z23 Encounter for immunization: Secondary | ICD-10-CM | POA: Diagnosis not present

## 2015-09-18 DIAGNOSIS — M5136 Other intervertebral disc degeneration, lumbar region: Secondary | ICD-10-CM | POA: Diagnosis not present

## 2015-09-18 DIAGNOSIS — Z79899 Other long term (current) drug therapy: Secondary | ICD-10-CM | POA: Diagnosis not present

## 2015-09-18 DIAGNOSIS — R7989 Other specified abnormal findings of blood chemistry: Secondary | ICD-10-CM | POA: Diagnosis not present

## 2015-09-25 ENCOUNTER — Encounter: Payer: Self-pay | Admitting: Gastroenterology

## 2015-09-25 ENCOUNTER — Ambulatory Visit (AMBULATORY_SURGERY_CENTER): Payer: PPO | Admitting: Gastroenterology

## 2015-09-25 VITALS — BP 130/50 | HR 67 | Temp 98.6°F | Resp 20 | Ht 61.0 in | Wt 112.0 lb

## 2015-09-25 DIAGNOSIS — F341 Dysthymic disorder: Secondary | ICD-10-CM | POA: Diagnosis not present

## 2015-09-25 DIAGNOSIS — K635 Polyp of colon: Secondary | ICD-10-CM

## 2015-09-25 DIAGNOSIS — I7300 Raynaud's syndrome without gangrene: Secondary | ICD-10-CM | POA: Diagnosis not present

## 2015-09-25 DIAGNOSIS — I10 Essential (primary) hypertension: Secondary | ICD-10-CM | POA: Diagnosis not present

## 2015-09-25 DIAGNOSIS — R1031 Right lower quadrant pain: Secondary | ICD-10-CM | POA: Diagnosis not present

## 2015-09-25 DIAGNOSIS — D125 Benign neoplasm of sigmoid colon: Secondary | ICD-10-CM

## 2015-09-25 MED ORDER — SODIUM CHLORIDE 0.9 % IV SOLN: 500.0000 mL | INTRAVENOUS | Status: DC

## 2015-09-25 NOTE — Op Note (Signed)
[facility] Patient Name: Holly Jimenez Procedure Date: 09/25/2015 2:17 PM MRN: [id number] Endoscopist: Milus Banister , MD Age: 70 Referring MD:  Date of Birth: October 31, 1945 Gender: Female Account #: [id number] Procedure:                Colonoscopy Indications:              Abdominal pain in the right lower quadrant Medicines:                Monitored Anesthesia Care Procedure:                Pre-Anesthesia Assessment:                           - Prior to the procedure, a History and Physical                            was performed, and patient medications and                            allergies were reviewed. The patient's tolerance of                            previous anesthesia was also reviewed. The risks                            and benefits of the procedure and the sedation                            options and risks were discussed with the patient.                            All questions were answered, and informed consent                            was obtained. Prior Anticoagulants: The patient has                            taken no previous anticoagulant or antiplatelet                            agents. ASA Grade Assessment: II - A patient with                            mild systemic disease. After reviewing the risks                            and benefits, the patient was deemed in                            satisfactory condition to undergo the procedure.                           After obtaining informed consent, the colonoscope  was passed under direct vision. Throughout the                            procedure, the patient's blood pressure, pulse, and                            oxygen saturations were monitored continuously. The                            Model CF-HQ190L [id number]) scope was introduced                            through the anus and advanced to the the terminal                            ileum.  The colonoscopy was performed without                            difficulty. The patient tolerated the procedure                            well. The quality of the bowel preparation was                            excellent. The terminal ileum, ileocecal valve,                            appendiceal orifice, and rectum were photographed. Scope In: 2:36:34 PM Scope Out: 2:47:16 PM Scope Withdrawal Time: 0 hours 6 minutes 55 seconds  Total Procedure Duration: 0 hours 10 minutes 42 seconds  Findings:                 The terminal ileum appeared normal.                           A 3 mm polyp was found in the sigmoid colon. The                            polyp was sessile. The polyp was removed with a                            cold snare. Resection and retrieval were complete.                           The exam was otherwise without abnormality on                            direct and retroflexion views. Complications:            No immediate complications. Estimated blood loss:                            None. Estimated Blood Loss:     Estimated blood loss: none. Impression:               -  The examined portion of the ileum was normal.                           - One 3 mm polyp in the sigmoid colon, removed with                            a cold snare. Resected and retrieved.                           - The examination was otherwise normal on direct                            and retroflexion views. Recommendation:           - Patient has a contact number available for                            emergencies. The signs and symptoms of potential                            delayed complications were discussed with the                            patient. Return to normal activities tomorrow.                            Written discharge instructions were provided to the                            patient.                           - Resume previous diet.                           - Continue  present medications.                           You will receive a letter within 2-3 weeks with the                            pathology results and my final recommendations.                           If the polyp(s) is proven to be 'pre-cancerous' on                            pathology, you will need repeat colonoscopy in 5                            years. Milus Banister, MD 09/25/2015 2:50:28 PM This report has been signed electronically.

## 2015-09-25 NOTE — Progress Notes (Signed)
Called to room to assist during endoscopic procedure.  Patient ID and intended procedure confirmed with present staff. Received instructions for my participation in the procedure from the performing physician.  

## 2015-09-25 NOTE — Patient Instructions (Signed)
YOU HAD AN ENDOSCOPIC PROCEDURE TODAY AT THE [facility]:   Refer to the procedure report that was given to you for any specific questions about what was found during the examination.  If the procedure report does not answer your questions, please call your gastroenterologist to clarify.  If you requested that your care partner not be given the details of your procedure findings, then the procedure report has been included in a sealed envelope for you to review at your convenience later.  YOU SHOULD EXPECT: Some feelings of bloating in the abdomen. Passage of more gas than usual.  Walking can help get rid of the air that was put into your GI tract during the procedure and reduce the bloating. If you had a lower endoscopy (such as a colonoscopy or flexible sigmoidoscopy) you may notice spotting of blood in your stool or on the toilet paper. If you underwent a bowel prep for your procedure, you may not have a normal bowel movement for a few days.  Please Note:  You might notice some irritation and congestion in your nose or some drainage.  This is from the oxygen used during your procedure.  There is no need for concern and it should clear up in a day or so.  SYMPTOMS TO REPORT IMMEDIATELY:   Following lower endoscopy (colonoscopy or flexible sigmoidoscopy):  Excessive amounts of blood in the stool  Significant tenderness or worsening of abdominal pains  Swelling of the abdomen that is new, acute  Fever of 100F or higher  For urgent or emergent issues, a gastroenterologist can be reached at any hour by calling [phone].   DIET:  We do recommend a small meal at first, but then you may proceed to your regular diet.  Drink plenty of fluids but you should avoid alcoholic beverages for 24 hours.  ACTIVITY:  You should plan to take it easy for the rest of today and you should NOT DRIVE or use heavy machinery until tomorrow (because of the sedation medicines used during the test).     FOLLOW UP: Our staff will call the number listed on your records the next business day following your procedure to check on you and address any questions or concerns that you may have regarding the information given to you following your procedure. If we do not reach you, we will leave a message.  However, if you are feeling well and you are not experiencing any problems, there is no need to return our call.  We will assume that you have returned to your regular daily activities without incident.  If any biopsies were taken you will be contacted by phone or by letter within the next 1-3 weeks.  Please call us at [phone] if you have not heard about the biopsies in 3 weeks.    SIGNATURES/CONFIDENTIALITY: You and/or your care partner have signed paperwork which will be entered into your electronic medical record.  These signatures attest to the fact that that the information above on your After Visit Summary has been reviewed and is understood.  Full responsibility of the confidentiality of this discharge information lies with you and/or your care-partner. 

## 2015-09-25 NOTE — Progress Notes (Signed)
Report to PACU, RN, vss, BBS= Clear.  

## 2015-09-26 ENCOUNTER — Telehealth: Payer: Self-pay

## 2015-09-26 NOTE — Telephone Encounter (Signed)
  Follow up Call-  Call back number 09/25/2015  Post procedure Call Back phone  # [phone]  Permission to leave phone message Yes  Some recent data might be hidden     Patient does not have questions regarding medication. Yes was checked in error.   Patient questions:  Do you have a fever, pain , or abdominal swelling? No. Pain Score  0 *  Have you tolerated food without any problems? Yes.    Have you been able to return to your normal activities? Yes.    Do you have any questions about your discharge instructions: Diet   No. Medications  Yes.   Follow up visit  No.  Do you have questions or concerns about your Care? No.  Actions: * If pain score is 4 or above: No action needed, pain <4.

## 2015-10-04 ENCOUNTER — Encounter: Payer: Self-pay | Admitting: Gastroenterology

## 2015-10-04 ENCOUNTER — Telehealth: Payer: Self-pay | Admitting: Gastroenterology

## 2015-10-04 NOTE — Telephone Encounter (Signed)
Holly Jimenez, PLease call her.  The polyp I removed was NOT cancerous or pre-cancerous.  NO need for follow up colonoscopy (age)  Thanks

## 2015-10-04 NOTE — Telephone Encounter (Signed)
Called the patient with pathology results. Patient verbalized understanding of results and no need for recall related to her age. Patient informed if she has a change in bowel habits or rectal bleeding she is to call Dr. Ardis Hughs.

## 2015-10-09 DIAGNOSIS — H35033 Hypertensive retinopathy, bilateral: Secondary | ICD-10-CM | POA: Diagnosis not present

## 2015-10-09 DIAGNOSIS — Z79899 Other long term (current) drug therapy: Secondary | ICD-10-CM | POA: Diagnosis not present

## 2015-11-08 DIAGNOSIS — E875 Hyperkalemia: Secondary | ICD-10-CM | POA: Diagnosis not present

## 2015-11-08 DIAGNOSIS — I701 Atherosclerosis of renal artery: Secondary | ICD-10-CM | POA: Diagnosis not present

## 2015-11-08 DIAGNOSIS — N179 Acute kidney failure, unspecified: Secondary | ICD-10-CM | POA: Diagnosis not present

## 2015-11-08 DIAGNOSIS — D631 Anemia in chronic kidney disease: Secondary | ICD-10-CM | POA: Diagnosis not present

## 2015-11-08 DIAGNOSIS — I129 Hypertensive chronic kidney disease with stage 1 through stage 4 chronic kidney disease, or unspecified chronic kidney disease: Secondary | ICD-10-CM | POA: Diagnosis not present

## 2015-11-08 DIAGNOSIS — N183 Chronic kidney disease, stage 3 (moderate): Secondary | ICD-10-CM | POA: Diagnosis not present

## 2015-11-08 DIAGNOSIS — M329 Systemic lupus erythematosus, unspecified: Secondary | ICD-10-CM | POA: Diagnosis not present

## 2015-11-08 DIAGNOSIS — N189 Chronic kidney disease, unspecified: Secondary | ICD-10-CM | POA: Diagnosis not present

## 2015-11-09 DIAGNOSIS — N183 Chronic kidney disease, stage 3 (moderate): Secondary | ICD-10-CM | POA: Diagnosis not present

## 2015-12-13 DIAGNOSIS — J329 Chronic sinusitis, unspecified: Secondary | ICD-10-CM | POA: Diagnosis not present

## 2015-12-18 DIAGNOSIS — M79672 Pain in left foot: Secondary | ICD-10-CM | POA: Diagnosis not present

## 2015-12-18 DIAGNOSIS — M5136 Other intervertebral disc degeneration, lumbar region: Secondary | ICD-10-CM | POA: Diagnosis not present

## 2015-12-18 DIAGNOSIS — M358 Other specified systemic involvement of connective tissue: Secondary | ICD-10-CM | POA: Diagnosis not present

## 2015-12-18 DIAGNOSIS — M810 Age-related osteoporosis without current pathological fracture: Secondary | ICD-10-CM | POA: Diagnosis not present

## 2015-12-18 DIAGNOSIS — M19071 Primary osteoarthritis, right ankle and foot: Secondary | ICD-10-CM | POA: Diagnosis not present

## 2015-12-18 DIAGNOSIS — Z79899 Other long term (current) drug therapy: Secondary | ICD-10-CM | POA: Diagnosis not present

## 2016-02-26 DIAGNOSIS — S22000D Wedge compression fracture of unspecified thoracic vertebra, subsequent encounter for fracture with routine healing: Secondary | ICD-10-CM | POA: Diagnosis not present

## 2016-02-26 DIAGNOSIS — I10 Essential (primary) hypertension: Secondary | ICD-10-CM | POA: Diagnosis not present

## 2016-02-26 DIAGNOSIS — E559 Vitamin D deficiency, unspecified: Secondary | ICD-10-CM | POA: Diagnosis not present

## 2016-02-26 DIAGNOSIS — R634 Abnormal weight loss: Secondary | ICD-10-CM | POA: Diagnosis not present

## 2016-03-04 DIAGNOSIS — M542 Cervicalgia: Secondary | ICD-10-CM | POA: Diagnosis not present

## 2016-03-04 DIAGNOSIS — Z5181 Encounter for therapeutic drug level monitoring: Secondary | ICD-10-CM | POA: Diagnosis not present

## 2016-03-04 DIAGNOSIS — N183 Chronic kidney disease, stage 3 (moderate): Secondary | ICD-10-CM | POA: Diagnosis not present

## 2016-03-04 DIAGNOSIS — M810 Age-related osteoporosis without current pathological fracture: Secondary | ICD-10-CM | POA: Diagnosis not present

## 2016-03-04 DIAGNOSIS — M5136 Other intervertebral disc degeneration, lumbar region: Secondary | ICD-10-CM | POA: Diagnosis not present

## 2016-03-04 DIAGNOSIS — F419 Anxiety disorder, unspecified: Secondary | ICD-10-CM | POA: Diagnosis not present

## 2016-03-04 DIAGNOSIS — I10 Essential (primary) hypertension: Secondary | ICD-10-CM | POA: Diagnosis not present

## 2016-03-10 DIAGNOSIS — G629 Polyneuropathy, unspecified: Secondary | ICD-10-CM | POA: Diagnosis not present

## 2016-03-10 DIAGNOSIS — I129 Hypertensive chronic kidney disease with stage 1 through stage 4 chronic kidney disease, or unspecified chronic kidney disease: Secondary | ICD-10-CM | POA: Diagnosis not present

## 2016-03-10 DIAGNOSIS — R2689 Other abnormalities of gait and mobility: Secondary | ICD-10-CM | POA: Diagnosis not present

## 2016-03-10 DIAGNOSIS — D631 Anemia in chronic kidney disease: Secondary | ICD-10-CM | POA: Diagnosis not present

## 2016-03-10 DIAGNOSIS — M5136 Other intervertebral disc degeneration, lumbar region: Secondary | ICD-10-CM | POA: Diagnosis not present

## 2016-03-10 DIAGNOSIS — M359 Systemic involvement of connective tissue, unspecified: Secondary | ICD-10-CM | POA: Diagnosis not present

## 2016-03-10 DIAGNOSIS — M810 Age-related osteoporosis without current pathological fracture: Secondary | ICD-10-CM | POA: Diagnosis not present

## 2016-03-10 DIAGNOSIS — N183 Chronic kidney disease, stage 3 (moderate): Secondary | ICD-10-CM | POA: Diagnosis not present

## 2016-03-10 DIAGNOSIS — F419 Anxiety disorder, unspecified: Secondary | ICD-10-CM | POA: Diagnosis not present

## 2016-03-10 DIAGNOSIS — M542 Cervicalgia: Secondary | ICD-10-CM | POA: Diagnosis not present

## 2016-03-14 DIAGNOSIS — I129 Hypertensive chronic kidney disease with stage 1 through stage 4 chronic kidney disease, or unspecified chronic kidney disease: Secondary | ICD-10-CM | POA: Diagnosis not present

## 2016-03-14 DIAGNOSIS — E875 Hyperkalemia: Secondary | ICD-10-CM | POA: Diagnosis not present

## 2016-03-14 DIAGNOSIS — I701 Atherosclerosis of renal artery: Secondary | ICD-10-CM | POA: Diagnosis not present

## 2016-03-14 DIAGNOSIS — D631 Anemia in chronic kidney disease: Secondary | ICD-10-CM | POA: Diagnosis not present

## 2016-03-14 DIAGNOSIS — M329 Systemic lupus erythematosus, unspecified: Secondary | ICD-10-CM | POA: Diagnosis not present

## 2016-03-14 DIAGNOSIS — N179 Acute kidney failure, unspecified: Secondary | ICD-10-CM | POA: Diagnosis not present

## 2016-03-14 DIAGNOSIS — N183 Chronic kidney disease, stage 3 (moderate): Secondary | ICD-10-CM | POA: Diagnosis not present

## 2016-03-20 DIAGNOSIS — M810 Age-related osteoporosis without current pathological fracture: Secondary | ICD-10-CM | POA: Diagnosis not present

## 2016-03-20 DIAGNOSIS — Z79899 Other long term (current) drug therapy: Secondary | ICD-10-CM | POA: Diagnosis not present

## 2016-03-20 DIAGNOSIS — M358 Other specified systemic involvement of connective tissue: Secondary | ICD-10-CM | POA: Diagnosis not present

## 2016-03-20 DIAGNOSIS — M5136 Other intervertebral disc degeneration, lumbar region: Secondary | ICD-10-CM | POA: Diagnosis not present

## 2016-04-10 DIAGNOSIS — Z79899 Other long term (current) drug therapy: Secondary | ICD-10-CM | POA: Diagnosis not present

## 2016-04-10 DIAGNOSIS — H2513 Age-related nuclear cataract, bilateral: Secondary | ICD-10-CM | POA: Diagnosis not present

## 2016-05-08 DIAGNOSIS — I10 Essential (primary) hypertension: Secondary | ICD-10-CM | POA: Diagnosis not present

## 2016-05-08 DIAGNOSIS — Z0000 Encounter for general adult medical examination without abnormal findings: Secondary | ICD-10-CM | POA: Diagnosis not present

## 2016-05-08 DIAGNOSIS — M545 Low back pain: Secondary | ICD-10-CM | POA: Diagnosis not present

## 2016-05-08 DIAGNOSIS — G8929 Other chronic pain: Secondary | ICD-10-CM | POA: Diagnosis not present

## 2016-05-08 DIAGNOSIS — M542 Cervicalgia: Secondary | ICD-10-CM | POA: Diagnosis not present

## 2016-05-08 DIAGNOSIS — Z23 Encounter for immunization: Secondary | ICD-10-CM | POA: Diagnosis not present

## 2016-06-09 DIAGNOSIS — R42 Dizziness and giddiness: Secondary | ICD-10-CM | POA: Diagnosis not present

## 2016-06-09 DIAGNOSIS — Z5181 Encounter for therapeutic drug level monitoring: Secondary | ICD-10-CM | POA: Diagnosis not present

## 2016-06-09 DIAGNOSIS — R1012 Left upper quadrant pain: Secondary | ICD-10-CM | POA: Diagnosis not present

## 2016-06-09 DIAGNOSIS — Z79899 Other long term (current) drug therapy: Secondary | ICD-10-CM | POA: Diagnosis not present

## 2016-06-11 ENCOUNTER — Other Ambulatory Visit: Payer: Self-pay | Admitting: Internal Medicine

## 2016-06-11 DIAGNOSIS — R42 Dizziness and giddiness: Secondary | ICD-10-CM

## 2016-06-11 DIAGNOSIS — R1012 Left upper quadrant pain: Secondary | ICD-10-CM

## 2016-06-24 DIAGNOSIS — I10 Essential (primary) hypertension: Secondary | ICD-10-CM | POA: Diagnosis not present

## 2016-06-24 DIAGNOSIS — G8929 Other chronic pain: Secondary | ICD-10-CM | POA: Diagnosis not present

## 2016-06-24 DIAGNOSIS — R1031 Right lower quadrant pain: Secondary | ICD-10-CM | POA: Diagnosis not present

## 2016-06-24 DIAGNOSIS — R51 Headache: Secondary | ICD-10-CM | POA: Diagnosis not present

## 2016-06-27 ENCOUNTER — Other Ambulatory Visit: Payer: Self-pay | Admitting: Internal Medicine

## 2016-06-27 DIAGNOSIS — R519 Headache, unspecified: Secondary | ICD-10-CM

## 2016-06-27 DIAGNOSIS — R51 Headache: Principal | ICD-10-CM

## 2016-07-14 ENCOUNTER — Ambulatory Visit
Admission: RE | Admit: 2016-07-14 | Discharge: 2016-07-14 | Disposition: A | Discharge status: Home / Self Care | Payer: PPO | Source: Ambulatory Visit | Attending: Internal Medicine | Admitting: Internal Medicine

## 2016-07-14 DIAGNOSIS — R51 Headache: Principal | ICD-10-CM

## 2016-07-14 DIAGNOSIS — R1032 Left lower quadrant pain: Secondary | ICD-10-CM | POA: Diagnosis not present

## 2016-07-14 DIAGNOSIS — R519 Headache, unspecified: Secondary | ICD-10-CM

## 2016-07-14 DIAGNOSIS — S0990XA Unspecified injury of head, initial encounter: Secondary | ICD-10-CM | POA: Diagnosis not present

## 2016-07-14 DIAGNOSIS — R1012 Left upper quadrant pain: Secondary | ICD-10-CM

## 2016-07-14 DIAGNOSIS — R42 Dizziness and giddiness: Secondary | ICD-10-CM

## 2016-07-14 MED ORDER — IOPAMIDOL (ISOVUE-300) INJECTION 61%: 80.0000 mL | Freq: Once | INTRAVENOUS | Status: DC | PRN

## 2016-07-16 DIAGNOSIS — G8929 Other chronic pain: Secondary | ICD-10-CM | POA: Diagnosis not present

## 2016-07-16 DIAGNOSIS — I10 Essential (primary) hypertension: Secondary | ICD-10-CM | POA: Diagnosis not present

## 2016-07-17 DIAGNOSIS — S065X9A Traumatic subdural hemorrhage with loss of consciousness of unspecified duration, initial encounter: Secondary | ICD-10-CM | POA: Diagnosis not present

## 2016-07-23 DIAGNOSIS — I10 Essential (primary) hypertension: Secondary | ICD-10-CM | POA: Diagnosis not present

## 2016-07-23 DIAGNOSIS — N183 Chronic kidney disease, stage 3 (moderate): Secondary | ICD-10-CM | POA: Diagnosis not present

## 2016-07-23 DIAGNOSIS — M5136 Other intervertebral disc degeneration, lumbar region: Secondary | ICD-10-CM | POA: Diagnosis not present

## 2016-07-23 DIAGNOSIS — I609 Nontraumatic subarachnoid hemorrhage, unspecified: Secondary | ICD-10-CM | POA: Diagnosis not present

## 2016-09-02 DIAGNOSIS — R51 Headache: Secondary | ICD-10-CM | POA: Diagnosis not present

## 2016-09-16 DIAGNOSIS — M5136 Other intervertebral disc degeneration, lumbar region: Secondary | ICD-10-CM | POA: Diagnosis not present

## 2016-09-16 DIAGNOSIS — Z5181 Encounter for therapeutic drug level monitoring: Secondary | ICD-10-CM | POA: Diagnosis not present

## 2016-09-16 DIAGNOSIS — R51 Headache: Secondary | ICD-10-CM | POA: Diagnosis not present

## 2016-09-16 DIAGNOSIS — M542 Cervicalgia: Secondary | ICD-10-CM | POA: Diagnosis not present

## 2016-09-16 DIAGNOSIS — I10 Essential (primary) hypertension: Secondary | ICD-10-CM | POA: Diagnosis not present

## 2016-09-22 DIAGNOSIS — M358 Other specified systemic involvement of connective tissue: Secondary | ICD-10-CM | POA: Diagnosis not present

## 2016-09-22 DIAGNOSIS — Z79899 Other long term (current) drug therapy: Secondary | ICD-10-CM | POA: Diagnosis not present

## 2016-09-22 DIAGNOSIS — M5136 Other intervertebral disc degeneration, lumbar region: Secondary | ICD-10-CM | POA: Diagnosis not present

## 2016-09-22 DIAGNOSIS — M810 Age-related osteoporosis without current pathological fracture: Secondary | ICD-10-CM | POA: Diagnosis not present

## 2016-09-30 DIAGNOSIS — I10 Essential (primary) hypertension: Secondary | ICD-10-CM | POA: Diagnosis not present

## 2016-10-03 DIAGNOSIS — N179 Acute kidney failure, unspecified: Secondary | ICD-10-CM | POA: Diagnosis not present

## 2016-10-03 DIAGNOSIS — Z79899 Other long term (current) drug therapy: Secondary | ICD-10-CM | POA: Diagnosis not present

## 2016-10-07 DIAGNOSIS — Z5181 Encounter for therapeutic drug level monitoring: Secondary | ICD-10-CM | POA: Diagnosis not present

## 2016-10-07 DIAGNOSIS — N179 Acute kidney failure, unspecified: Secondary | ICD-10-CM | POA: Diagnosis not present

## 2016-10-09 DIAGNOSIS — E875 Hyperkalemia: Secondary | ICD-10-CM | POA: Diagnosis not present

## 2016-10-09 DIAGNOSIS — N179 Acute kidney failure, unspecified: Secondary | ICD-10-CM | POA: Diagnosis not present

## 2016-10-09 DIAGNOSIS — Z79899 Other long term (current) drug therapy: Secondary | ICD-10-CM | POA: Diagnosis not present

## 2016-10-16 DIAGNOSIS — N183 Chronic kidney disease, stage 3 (moderate): Secondary | ICD-10-CM | POA: Diagnosis not present

## 2016-10-16 DIAGNOSIS — D631 Anemia in chronic kidney disease: Secondary | ICD-10-CM | POA: Diagnosis not present

## 2016-10-16 DIAGNOSIS — I701 Atherosclerosis of renal artery: Secondary | ICD-10-CM | POA: Diagnosis not present

## 2016-10-16 DIAGNOSIS — E875 Hyperkalemia: Secondary | ICD-10-CM | POA: Diagnosis not present

## 2016-10-16 DIAGNOSIS — M329 Systemic lupus erythematosus, unspecified: Secondary | ICD-10-CM | POA: Diagnosis not present

## 2016-10-16 DIAGNOSIS — I129 Hypertensive chronic kidney disease with stage 1 through stage 4 chronic kidney disease, or unspecified chronic kidney disease: Secondary | ICD-10-CM | POA: Diagnosis not present

## 2016-10-16 DIAGNOSIS — N179 Acute kidney failure, unspecified: Secondary | ICD-10-CM | POA: Diagnosis not present

## 2016-10-29 DIAGNOSIS — Z79899 Other long term (current) drug therapy: Secondary | ICD-10-CM | POA: Diagnosis not present

## 2016-10-29 DIAGNOSIS — H2513 Age-related nuclear cataract, bilateral: Secondary | ICD-10-CM | POA: Diagnosis not present

## 2016-10-29 DIAGNOSIS — H35033 Hypertensive retinopathy, bilateral: Secondary | ICD-10-CM | POA: Diagnosis not present

## 2016-11-05 ENCOUNTER — Encounter: Payer: Self-pay | Admitting: Neurology

## 2016-11-05 ENCOUNTER — Ambulatory Visit (INDEPENDENT_AMBULATORY_CARE_PROVIDER_SITE_OTHER): Payer: PPO | Admitting: Neurology

## 2016-11-05 DIAGNOSIS — S065X9A Traumatic subdural hemorrhage with loss of consciousness of unspecified duration, initial encounter: Secondary | ICD-10-CM | POA: Diagnosis not present

## 2016-11-05 DIAGNOSIS — S065XAA Traumatic subdural hemorrhage with loss of consciousness status unknown, initial encounter: Secondary | ICD-10-CM | POA: Insufficient documentation

## 2016-11-05 MED ORDER — TOPIRAMATE 100 MG PO TABS: 100.0000 mg | ORAL_TABLET | Freq: Two times a day (BID) | ORAL | 11 refills | Status: DC

## 2016-11-05 NOTE — Progress Notes (Signed)
PATIENT: Holly Jimenez DOB: [birth year]  Chief Complaint  Patient presents with  . Headache    Reports hitting her head on a cabinet door in June 2018 and developing daily headaches after the injury.  She underwent a MRI showing a small subdural hematoma.  She is currently taking topiramate 50mg , BID.  She will occasionally take hydrocodone when her pain is more severe.  She does not medicate the milder headaches.  Marland Kitchen PCP    Jani Gravel, MD     HISTORICAL  Holly Jimenez is a 71 year old female, seen in refer by her primary care doctor Jani Gravel for evaluation of headaches, initial evaluation was November 05 2016.  I reviewed and summarized referring note, she has history of mixed connective tissue disease, chronic kidney disease, anemia of chronic kidney disease, vitamin D deficiency, hypertension, hyperlipidemia, chronic low back pain, Is taking chronic narcotics, hydrocodone/APAP 5/325 mg every 6 hours,  In July 2018, she forcefully bumped her left temporal region at the corner of the cabinet door, she saw stars, no loss of consciousness, she had instant left temporal area pain, tenderness upon palpitation,  She had persistent daily headache, moderate to severe since the incident, eventually had MRI of the brain on July 14 2016, this is about 2 weeks out of the initial head trauma, which showed showed 1-2 mm subdural hematoma, atrophy and small vessel disease.  She was started on Topamax 50 mg twice a day since August 2018, she can tolerate the medication fine, which seems to help cut back the frequency and severity of headaches.  Now she has almost constant low-grade pressure pain, but couple times each week, it will be exacerbated to a more severe pounding headaches, she tolerated Topamax well, tried over-the-counter medication Tylenol without helping her headaches,   REVIEW OF SYSTEMS: Full 14 system review of systems performed and notable only for constipation,  achy muscles, headaches, difficulty swallowing, insomnia  ALLERGIES: Allergies  Allergen Reactions  . Sulfonamide Derivatives Hives    HOME MEDICATIONS: Current Outpatient Prescriptions  Medication Sig Dispense Refill  . ALPRAZolam (XANAX) 0.25 MG tablet Take 0.25 mg by mouth 3 (three) times daily as needed for anxiety.     Marland Kitchen amLODipine (NORVASC) 2.5 MG tablet Take 2.5 mg by mouth daily.  0  . aspirin EC 81 MG tablet Take 81 mg by mouth daily.    . cetirizine (ZYRTEC) 5 MG tablet Take 5 mg by mouth daily as needed for allergies.     . cholecalciferol (VITAMIN D) 1000 UNITS tablet Take 1,000 Units by mouth daily.    . ferrous gluconate (FERGON) 240 (27 FE) MG tablet Take 240 mg by mouth 2 (two) times daily.    Marland Kitchen HYDROcodone-acetaminophen (NORCO/VICODIN) 5-325 MG per tablet Take 1 tablet by mouth every 6 (six) hours as needed for moderate pain.    . hydroxychloroquine (PLAQUENIL) 200 MG tablet Take 200 mg by mouth 2 (two) times daily.     Marland Kitchen ketoconazole (NIZORAL) 2 % cream Apply 1 application topically daily as needed (infection treatment).     Marland Kitchen lidocaine (LIDODERM) 5 % Place 1 patch onto the skin daily as needed (pain). Remove & Discard patch within 12 hours or as directed by MD    . Multiple Vitamin (MULTIVITAMIN WITH MINERALS) TABS tablet Take 1 tablet by mouth daily.    Marland Kitchen omeprazole (PRILOSEC) 20 MG capsule Take 20 mg by mouth 2 (two) times daily.     Marland Kitchen PHENADOZ 25 MG  suppository Place 25 mg rectally every 6 (six) hours as needed for nausea or vomiting.   0  . predniSONE (DELTASONE) 5 MG tablet Take 5 mg by mouth every other day. continuous    . pregabalin (LYRICA) 75 MG capsule Take 75 mg by mouth 2 (two) times daily.    Marland Kitchen pyridOXINE (VITAMIN B-6) 100 MG tablet Take 100 mg by mouth daily.    . Tetrahydrozoline HCl (VISINE OP) Place 2 drops into both eyes as needed (for dry eyes).    . topiramate (TOPAMAX) 50 MG tablet Take 50 mg by mouth 2 (two) times daily.  0  . traZODone (DESYREL)  50 MG tablet Take 50 mg by mouth at bedtime.    . vitamin B-12 (CYANOCOBALAMIN) 1000 MCG tablet Take 1,000 mcg by mouth daily.     No current facility-administered medications for this visit.     PAST MEDICAL HISTORY: Past Medical History:  Diagnosis Date  . Abnormal LFTs   . Allergic rhinitis   . Anemia   . Anxiety   . Arthritis   . Chronic back pain   . Chronic headaches   . Chronic kidney disease, unspecified   . DDD (degenerative disc disease)   . GERD (gastroesophageal reflux disease)   . Headache   . Hypercalcemia   . Hyperkalemia   . Hyperlipidemia   . Hypertension    KIDNEY SPECIALIST TOOK OFF BENICAR  . IBS (irritable bowel syndrome)   . Lupus   . Mixed connective tissue disease ([place])   . Nausea   . Neuropathy   . Peptic ulcer disease   . Raynaud's disease   . Small kidney    left  . Vitamin D deficiency disease     PAST SURGICAL HISTORY: Past Surgical History:  Procedure Laterality Date  . ABDOMINAL HYSTERECTOMY  1975  . APPENDECTOMY  1972  . FOOT SURGERY     LEFT  . KYPHOPLASTY Bilateral 03/08/2014   Procedure: Thoracic nine Kyphoplasty;  Surgeon: Ashok Pall, MD;  Location: [facility] [facility];  Service: Neurosurgery;  Laterality: Bilateral;  Thoracic nine Kyphoplasty  . LIPOMA EXCISION     back  . TONSILLECTOMY    . WRIST GANGLION EXCISION     left    FAMILY HISTORY: Family History  Problem Relation Age of Onset  . Ovarian cancer Sister   . Colon cancer Sister   . Heart disease Paternal Grandfather   . Heart disease Maternal Grandfather   . Kidney disease Brother   . Kidney disease Mother   . Hypertension Mother   . Kidney disease Maternal Grandmother   . Diabetes Paternal Grandmother   . Asthma Father   . Suicidality Father   . Alcoholism Father     SOCIAL HISTORY:  Social History   Social History  . Marital status: Divorced    Spouse name: N/A  . Number of children: 1  . Years of education: Associates   Occupational History  .  retired [employer]   Social History Main Topics  . Smoking status: Former Smoker    Packs/day: 1.00    Years: 25.00    Types: Cigarettes    Quit date: 03/05/2000  . Smokeless tobacco: Never Used  . Alcohol use Yes     Comment: Rare  . Drug use: No     Comment: previous use of marijuana for pain  . Sexual activity: Not on file   Other Topics Concern  . Not on file   Social History  Narrative   Lives at home alone.   Right-handed.   Rare use of caffeine.     PHYSICAL EXAM   Vitals:   11/05/16 1001  BP: (!) 116/56  Pulse: 78  Weight: 110 lb (49.9 kg)  Height: 5\' 1"  (1.549 m)    Not recorded      Body mass index is 20.78 kg/m.  PHYSICAL EXAMNIATION:  Gen: NAD, conversant, well nourised, obese, well groomed                     Cardiovascular: Regular rate rhythm, no peripheral edema, warm, nontender. Eyes: Conjunctivae clear without exudates or hemorrhage Neck: Supple, no carotid bruits. Pulmonary: Clear to auscultation bilaterally   NEUROLOGICAL EXAM:  MENTAL STATUS: Speech:    Speech is normal; fluent and spontaneous with normal comprehension.  Cognition:     Orientation to time, place and person     Normal recent and remote memory     Normal Attention span and concentration     Normal Language, naming, repeating,spontaneous speech     Fund of knowledge   CRANIAL NERVES: CN II: Visual fields are full to confrontation. Fundoscopic exam is normal with sharp discs and no vascular changes. Pupils are round equal and briskly reactive to light. CN III, IV, VI: extraocular movement are normal. No ptosis. CN V: Facial sensation is intact to pinprick in all 3 divisions bilaterally. Corneal responses are intact.  CN VII: Face is symmetric with normal eye closure and smile. CN VIII: Hearing is normal to rubbing fingers CN IX, X: Palate elevates symmetrically. Phonation is normal. CN XI: Head turning and shoulder shrug are intact CN XII: Tongue is midline  with normal movements and no atrophy.  MOTOR: There is no pronator drift of out-stretched arms. Muscle bulk and tone are normal. Muscle strength is normal.  REFLEXES: Reflexes are 2+ and symmetric at the biceps, triceps, knees, and ankles. Plantar responses are flexor.  SENSORY: Intact to light touch, pinprick, positional sensation and vibratory sensation are intact in fingers and toes.  COORDINATION: Rapid alternating movements and fine finger movements are intact. There is no dysmetria on finger-to-nose and heel-knee-shin.    GAIT/STANCE: Posture is normal. Gait is steady with normal steps, base, arm swing, and turning. Heel and toe walking are normal. Tandem gait is normal.  Romberg is absent.   DIAGNOSTIC DATA (LABS, IMAGING, TESTING) - I reviewed patient records, labs, notes, testing and imaging myself where available.   ASSESSMENT AND PLAN  Holly Jimenez is a 71 y.o. female   Subdural hematoma New onset headaches  Increase Topamax to 100 mg twice a day,  Tylenol as needed     Marcial Pacas, M.D. Ph.D.  [facility] [address] [place], [place] [address] Ph: [phone] Fax: [phone]  CC: Jani Gravel, MD

## 2016-11-13 DIAGNOSIS — Z79899 Other long term (current) drug therapy: Secondary | ICD-10-CM | POA: Diagnosis not present

## 2016-11-13 DIAGNOSIS — I10 Essential (primary) hypertension: Secondary | ICD-10-CM | POA: Diagnosis not present

## 2016-11-13 DIAGNOSIS — E875 Hyperkalemia: Secondary | ICD-10-CM | POA: Diagnosis not present

## 2016-11-13 DIAGNOSIS — N179 Acute kidney failure, unspecified: Secondary | ICD-10-CM | POA: Diagnosis not present

## 2016-11-13 DIAGNOSIS — Z5181 Encounter for therapeutic drug level monitoring: Secondary | ICD-10-CM | POA: Diagnosis not present

## 2017-01-09 DIAGNOSIS — R2681 Unsteadiness on feet: Secondary | ICD-10-CM | POA: Diagnosis not present

## 2017-01-09 DIAGNOSIS — S32511A Fracture of superior rim of right pubis, initial encounter for closed fracture: Secondary | ICD-10-CM | POA: Diagnosis not present

## 2017-01-09 DIAGNOSIS — M25551 Pain in right hip: Secondary | ICD-10-CM | POA: Diagnosis not present

## 2017-01-19 DIAGNOSIS — R2689 Other abnormalities of gait and mobility: Secondary | ICD-10-CM | POA: Diagnosis not present

## 2017-02-05 DIAGNOSIS — G4700 Insomnia, unspecified: Secondary | ICD-10-CM | POA: Diagnosis not present

## 2017-02-05 DIAGNOSIS — K5900 Constipation, unspecified: Secondary | ICD-10-CM | POA: Diagnosis not present

## 2017-02-05 DIAGNOSIS — S32591D Other specified fracture of right pubis, subsequent encounter for fracture with routine healing: Secondary | ICD-10-CM | POA: Diagnosis not present

## 2017-02-05 DIAGNOSIS — R350 Frequency of micturition: Secondary | ICD-10-CM | POA: Diagnosis not present

## 2017-02-09 DIAGNOSIS — D631 Anemia in chronic kidney disease: Secondary | ICD-10-CM | POA: Diagnosis not present

## 2017-02-09 DIAGNOSIS — R2689 Other abnormalities of gait and mobility: Secondary | ICD-10-CM | POA: Diagnosis not present

## 2017-02-09 DIAGNOSIS — Z7982 Long term (current) use of aspirin: Secondary | ICD-10-CM | POA: Diagnosis not present

## 2017-02-09 DIAGNOSIS — M25551 Pain in right hip: Secondary | ICD-10-CM | POA: Diagnosis not present

## 2017-02-09 DIAGNOSIS — I129 Hypertensive chronic kidney disease with stage 1 through stage 4 chronic kidney disease, or unspecified chronic kidney disease: Secondary | ICD-10-CM | POA: Diagnosis not present

## 2017-02-09 DIAGNOSIS — Z87891 Personal history of nicotine dependence: Secondary | ICD-10-CM | POA: Diagnosis not present

## 2017-02-09 DIAGNOSIS — Z7952 Long term (current) use of systemic steroids: Secondary | ICD-10-CM | POA: Diagnosis not present

## 2017-02-09 DIAGNOSIS — E559 Vitamin D deficiency, unspecified: Secondary | ICD-10-CM | POA: Diagnosis not present

## 2017-02-09 DIAGNOSIS — N183 Chronic kidney disease, stage 3 (moderate): Secondary | ICD-10-CM | POA: Diagnosis not present

## 2017-03-02 DIAGNOSIS — I10 Essential (primary) hypertension: Secondary | ICD-10-CM | POA: Diagnosis not present

## 2017-03-02 DIAGNOSIS — Z79899 Other long term (current) drug therapy: Secondary | ICD-10-CM | POA: Diagnosis not present

## 2017-03-05 DIAGNOSIS — M25551 Pain in right hip: Secondary | ICD-10-CM | POA: Diagnosis not present

## 2017-03-05 DIAGNOSIS — M351 Other overlap syndromes: Secondary | ICD-10-CM | POA: Diagnosis not present

## 2017-03-05 DIAGNOSIS — F419 Anxiety disorder, unspecified: Secondary | ICD-10-CM | POA: Diagnosis not present

## 2017-03-05 DIAGNOSIS — G629 Polyneuropathy, unspecified: Secondary | ICD-10-CM | POA: Diagnosis not present

## 2017-03-20 DIAGNOSIS — I129 Hypertensive chronic kidney disease with stage 1 through stage 4 chronic kidney disease, or unspecified chronic kidney disease: Secondary | ICD-10-CM | POA: Diagnosis not present

## 2017-03-20 DIAGNOSIS — M329 Systemic lupus erythematosus, unspecified: Secondary | ICD-10-CM | POA: Diagnosis not present

## 2017-03-20 DIAGNOSIS — D631 Anemia in chronic kidney disease: Secondary | ICD-10-CM | POA: Diagnosis not present

## 2017-03-20 DIAGNOSIS — I701 Atherosclerosis of renal artery: Secondary | ICD-10-CM | POA: Diagnosis not present

## 2017-03-20 DIAGNOSIS — N179 Acute kidney failure, unspecified: Secondary | ICD-10-CM | POA: Diagnosis not present

## 2017-03-20 DIAGNOSIS — N183 Chronic kidney disease, stage 3 (moderate): Secondary | ICD-10-CM | POA: Diagnosis not present

## 2017-03-20 DIAGNOSIS — E875 Hyperkalemia: Secondary | ICD-10-CM | POA: Diagnosis not present

## 2017-04-02 DIAGNOSIS — Z5181 Encounter for therapeutic drug level monitoring: Secondary | ICD-10-CM | POA: Diagnosis not present

## 2017-04-02 DIAGNOSIS — M358 Other specified systemic involvement of connective tissue: Secondary | ICD-10-CM | POA: Diagnosis not present

## 2017-04-02 DIAGNOSIS — M25551 Pain in right hip: Secondary | ICD-10-CM | POA: Diagnosis not present

## 2017-04-02 DIAGNOSIS — R1031 Right lower quadrant pain: Secondary | ICD-10-CM | POA: Diagnosis not present

## 2017-04-29 DIAGNOSIS — Z79899 Other long term (current) drug therapy: Secondary | ICD-10-CM | POA: Diagnosis not present

## 2017-04-29 DIAGNOSIS — H2513 Age-related nuclear cataract, bilateral: Secondary | ICD-10-CM | POA: Diagnosis not present

## 2017-04-30 DIAGNOSIS — M25551 Pain in right hip: Secondary | ICD-10-CM | POA: Diagnosis not present

## 2017-04-30 DIAGNOSIS — Z5181 Encounter for therapeutic drug level monitoring: Secondary | ICD-10-CM | POA: Diagnosis not present

## 2017-05-11 ENCOUNTER — Other Ambulatory Visit: Payer: Self-pay | Admitting: Internal Medicine

## 2017-05-11 DIAGNOSIS — Z1231 Encounter for screening mammogram for malignant neoplasm of breast: Secondary | ICD-10-CM

## 2017-06-02 DIAGNOSIS — I10 Essential (primary) hypertension: Secondary | ICD-10-CM | POA: Diagnosis not present

## 2017-06-02 DIAGNOSIS — M358 Other specified systemic involvement of connective tissue: Secondary | ICD-10-CM | POA: Diagnosis not present

## 2017-06-02 DIAGNOSIS — K5900 Constipation, unspecified: Secondary | ICD-10-CM | POA: Diagnosis not present

## 2017-06-02 DIAGNOSIS — Z79899 Other long term (current) drug therapy: Secondary | ICD-10-CM | POA: Diagnosis not present

## 2017-06-02 DIAGNOSIS — E875 Hyperkalemia: Secondary | ICD-10-CM | POA: Diagnosis not present

## 2017-06-03 ENCOUNTER — Ambulatory Visit
Admission: RE | Admit: 2017-06-03 | Discharge: 2017-06-03 | Disposition: A | Discharge status: Home / Self Care | Payer: PPO | Source: Ambulatory Visit | Attending: Internal Medicine | Admitting: Internal Medicine

## 2017-06-03 DIAGNOSIS — Z1231 Encounter for screening mammogram for malignant neoplasm of breast: Secondary | ICD-10-CM | POA: Diagnosis not present

## 2017-06-18 DIAGNOSIS — M5136 Other intervertebral disc degeneration, lumbar region: Secondary | ICD-10-CM | POA: Diagnosis not present

## 2017-06-18 DIAGNOSIS — M358 Other specified systemic involvement of connective tissue: Secondary | ICD-10-CM | POA: Diagnosis not present

## 2017-06-18 DIAGNOSIS — M810 Age-related osteoporosis without current pathological fracture: Secondary | ICD-10-CM | POA: Diagnosis not present

## 2017-06-18 DIAGNOSIS — Z79899 Other long term (current) drug therapy: Secondary | ICD-10-CM | POA: Diagnosis not present

## 2017-07-29 DIAGNOSIS — Z5181 Encounter for therapeutic drug level monitoring: Secondary | ICD-10-CM | POA: Diagnosis not present

## 2017-07-29 DIAGNOSIS — M358 Other specified systemic involvement of connective tissue: Secondary | ICD-10-CM | POA: Diagnosis not present

## 2017-08-03 DIAGNOSIS — Z0000 Encounter for general adult medical examination without abnormal findings: Secondary | ICD-10-CM | POA: Diagnosis not present

## 2017-08-03 DIAGNOSIS — M358 Other specified systemic involvement of connective tissue: Secondary | ICD-10-CM | POA: Diagnosis not present

## 2017-08-03 DIAGNOSIS — Z79899 Other long term (current) drug therapy: Secondary | ICD-10-CM | POA: Diagnosis not present

## 2017-08-27 DIAGNOSIS — N179 Acute kidney failure, unspecified: Secondary | ICD-10-CM | POA: Diagnosis not present

## 2017-08-27 DIAGNOSIS — I129 Hypertensive chronic kidney disease with stage 1 through stage 4 chronic kidney disease, or unspecified chronic kidney disease: Secondary | ICD-10-CM | POA: Diagnosis not present

## 2017-08-27 DIAGNOSIS — N183 Chronic kidney disease, stage 3 (moderate): Secondary | ICD-10-CM | POA: Diagnosis not present

## 2017-08-27 DIAGNOSIS — E875 Hyperkalemia: Secondary | ICD-10-CM | POA: Diagnosis not present

## 2017-08-27 DIAGNOSIS — I701 Atherosclerosis of renal artery: Secondary | ICD-10-CM | POA: Diagnosis not present

## 2017-08-27 DIAGNOSIS — M329 Systemic lupus erythematosus, unspecified: Secondary | ICD-10-CM | POA: Diagnosis not present

## 2017-08-27 DIAGNOSIS — D631 Anemia in chronic kidney disease: Secondary | ICD-10-CM | POA: Diagnosis not present

## 2017-09-10 DIAGNOSIS — M47816 Spondylosis without myelopathy or radiculopathy, lumbar region: Secondary | ICD-10-CM | POA: Diagnosis not present

## 2017-09-10 DIAGNOSIS — M47812 Spondylosis without myelopathy or radiculopathy, cervical region: Secondary | ICD-10-CM | POA: Diagnosis not present

## 2017-09-22 DIAGNOSIS — M47816 Spondylosis without myelopathy or radiculopathy, lumbar region: Secondary | ICD-10-CM | POA: Diagnosis not present

## 2017-09-30 DIAGNOSIS — I10 Essential (primary) hypertension: Secondary | ICD-10-CM | POA: Diagnosis not present

## 2017-09-30 DIAGNOSIS — Z79899 Other long term (current) drug therapy: Secondary | ICD-10-CM | POA: Diagnosis not present

## 2017-09-30 DIAGNOSIS — Z0000 Encounter for general adult medical examination without abnormal findings: Secondary | ICD-10-CM | POA: Diagnosis not present

## 2017-09-30 DIAGNOSIS — N183 Chronic kidney disease, stage 3 (moderate): Secondary | ICD-10-CM | POA: Diagnosis not present

## 2017-09-30 DIAGNOSIS — E559 Vitamin D deficiency, unspecified: Secondary | ICD-10-CM | POA: Diagnosis not present

## 2017-10-08 DIAGNOSIS — S065X9A Traumatic subdural hemorrhage with loss of consciousness of unspecified duration, initial encounter: Secondary | ICD-10-CM | POA: Diagnosis not present

## 2017-10-13 ENCOUNTER — Other Ambulatory Visit: Payer: Self-pay | Admitting: Neurosurgery

## 2017-10-13 DIAGNOSIS — F419 Anxiety disorder, unspecified: Secondary | ICD-10-CM | POA: Diagnosis not present

## 2017-10-13 DIAGNOSIS — G4700 Insomnia, unspecified: Secondary | ICD-10-CM | POA: Diagnosis not present

## 2017-10-13 DIAGNOSIS — S065X9A Traumatic subdural hemorrhage with loss of consciousness of unspecified duration, initial encounter: Secondary | ICD-10-CM

## 2017-10-13 DIAGNOSIS — M5410 Radiculopathy, site unspecified: Secondary | ICD-10-CM | POA: Diagnosis not present

## 2017-10-13 DIAGNOSIS — N183 Chronic kidney disease, stage 3 (moderate): Secondary | ICD-10-CM | POA: Diagnosis not present

## 2017-10-13 DIAGNOSIS — Z0000 Encounter for general adult medical examination without abnormal findings: Secondary | ICD-10-CM | POA: Diagnosis not present

## 2017-10-13 DIAGNOSIS — M810 Age-related osteoporosis without current pathological fracture: Secondary | ICD-10-CM | POA: Diagnosis not present

## 2017-10-13 DIAGNOSIS — G629 Polyneuropathy, unspecified: Secondary | ICD-10-CM | POA: Diagnosis not present

## 2017-10-13 DIAGNOSIS — M5136 Other intervertebral disc degeneration, lumbar region: Secondary | ICD-10-CM | POA: Diagnosis not present

## 2017-10-13 DIAGNOSIS — I10 Essential (primary) hypertension: Secondary | ICD-10-CM | POA: Diagnosis not present

## 2017-10-13 DIAGNOSIS — M358 Other specified systemic involvement of connective tissue: Secondary | ICD-10-CM | POA: Diagnosis not present

## 2017-10-13 DIAGNOSIS — D649 Anemia, unspecified: Secondary | ICD-10-CM | POA: Diagnosis not present

## 2017-10-13 DIAGNOSIS — R42 Dizziness and giddiness: Secondary | ICD-10-CM | POA: Diagnosis not present

## 2017-10-13 DIAGNOSIS — S065XAA Traumatic subdural hemorrhage with loss of consciousness status unknown, initial encounter: Secondary | ICD-10-CM

## 2017-10-19 ENCOUNTER — Ambulatory Visit
Admission: RE | Admit: 2017-10-19 | Discharge: 2017-10-19 | Disposition: A | Discharge status: Home / Self Care | Payer: PPO | Source: Ambulatory Visit | Attending: Neurosurgery | Admitting: Neurosurgery

## 2017-10-19 DIAGNOSIS — S065X9A Traumatic subdural hemorrhage with loss of consciousness of unspecified duration, initial encounter: Secondary | ICD-10-CM

## 2017-10-19 DIAGNOSIS — I6200 Nontraumatic subdural hemorrhage, unspecified: Secondary | ICD-10-CM | POA: Diagnosis not present

## 2017-10-19 DIAGNOSIS — S065XAA Traumatic subdural hemorrhage with loss of consciousness status unknown, initial encounter: Secondary | ICD-10-CM

## 2017-10-20 ENCOUNTER — Ambulatory Visit: Payer: PPO | Admitting: Neurology

## 2017-10-20 ENCOUNTER — Encounter

## 2017-11-03 DIAGNOSIS — S065X9A Traumatic subdural hemorrhage with loss of consciousness of unspecified duration, initial encounter: Secondary | ICD-10-CM | POA: Diagnosis not present

## 2017-11-17 DIAGNOSIS — M358 Other specified systemic involvement of connective tissue: Secondary | ICD-10-CM | POA: Diagnosis not present

## 2017-11-17 DIAGNOSIS — M5136 Other intervertebral disc degeneration, lumbar region: Secondary | ICD-10-CM | POA: Diagnosis not present

## 2017-11-17 DIAGNOSIS — Z79899 Other long term (current) drug therapy: Secondary | ICD-10-CM | POA: Diagnosis not present

## 2017-11-17 DIAGNOSIS — M06062 Rheumatoid arthritis without rheumatoid factor, left knee: Secondary | ICD-10-CM | POA: Diagnosis not present

## 2017-11-17 DIAGNOSIS — M25561 Pain in right knee: Secondary | ICD-10-CM | POA: Diagnosis not present

## 2017-11-17 DIAGNOSIS — M810 Age-related osteoporosis without current pathological fracture: Secondary | ICD-10-CM | POA: Diagnosis not present

## 2017-11-17 DIAGNOSIS — M06061 Rheumatoid arthritis without rheumatoid factor, right knee: Secondary | ICD-10-CM | POA: Diagnosis not present

## 2017-11-22 ENCOUNTER — Other Ambulatory Visit: Payer: Self-pay | Admitting: Neurology

## 2017-12-16 DIAGNOSIS — H43813 Vitreous degeneration, bilateral: Secondary | ICD-10-CM | POA: Diagnosis not present

## 2017-12-16 DIAGNOSIS — Z79899 Other long term (current) drug therapy: Secondary | ICD-10-CM | POA: Diagnosis not present

## 2017-12-16 DIAGNOSIS — H2513 Age-related nuclear cataract, bilateral: Secondary | ICD-10-CM | POA: Diagnosis not present

## 2017-12-16 DIAGNOSIS — H35033 Hypertensive retinopathy, bilateral: Secondary | ICD-10-CM | POA: Diagnosis not present

## 2017-12-22 DIAGNOSIS — D8989 Other specified disorders involving the immune mechanism, not elsewhere classified: Secondary | ICD-10-CM | POA: Diagnosis not present

## 2017-12-22 DIAGNOSIS — M810 Age-related osteoporosis without current pathological fracture: Secondary | ICD-10-CM | POA: Diagnosis not present

## 2017-12-22 DIAGNOSIS — M358 Other specified systemic involvement of connective tissue: Secondary | ICD-10-CM | POA: Diagnosis not present

## 2017-12-22 DIAGNOSIS — M79643 Pain in unspecified hand: Secondary | ICD-10-CM | POA: Diagnosis not present

## 2017-12-22 DIAGNOSIS — Z79899 Other long term (current) drug therapy: Secondary | ICD-10-CM | POA: Diagnosis not present

## 2017-12-22 DIAGNOSIS — M79604 Pain in right leg: Secondary | ICD-10-CM | POA: Diagnosis not present

## 2017-12-22 DIAGNOSIS — M25561 Pain in right knee: Secondary | ICD-10-CM | POA: Diagnosis not present

## 2017-12-22 DIAGNOSIS — M5136 Other intervertebral disc degeneration, lumbar region: Secondary | ICD-10-CM | POA: Diagnosis not present

## 2017-12-22 DIAGNOSIS — M3500 Sicca syndrome, unspecified: Secondary | ICD-10-CM | POA: Diagnosis not present

## 2018-01-07 DIAGNOSIS — Z0000 Encounter for general adult medical examination without abnormal findings: Secondary | ICD-10-CM | POA: Diagnosis not present

## 2018-01-07 DIAGNOSIS — Z79899 Other long term (current) drug therapy: Secondary | ICD-10-CM | POA: Diagnosis not present

## 2018-01-13 DIAGNOSIS — N183 Chronic kidney disease, stage 3 (moderate): Secondary | ICD-10-CM | POA: Diagnosis not present

## 2018-01-13 DIAGNOSIS — I10 Essential (primary) hypertension: Secondary | ICD-10-CM | POA: Diagnosis not present

## 2018-01-13 DIAGNOSIS — G4700 Insomnia, unspecified: Secondary | ICD-10-CM | POA: Diagnosis not present

## 2018-01-13 DIAGNOSIS — M5136 Other intervertebral disc degeneration, lumbar region: Secondary | ICD-10-CM | POA: Diagnosis not present

## 2018-01-13 DIAGNOSIS — M358 Other specified systemic involvement of connective tissue: Secondary | ICD-10-CM | POA: Diagnosis not present

## 2018-01-13 DIAGNOSIS — R351 Nocturia: Secondary | ICD-10-CM | POA: Diagnosis not present

## 2018-01-13 DIAGNOSIS — F419 Anxiety disorder, unspecified: Secondary | ICD-10-CM | POA: Diagnosis not present

## 2018-01-13 DIAGNOSIS — D649 Anemia, unspecified: Secondary | ICD-10-CM | POA: Diagnosis not present

## 2018-01-13 DIAGNOSIS — G629 Polyneuropathy, unspecified: Secondary | ICD-10-CM | POA: Diagnosis not present

## 2018-01-18 ENCOUNTER — Other Ambulatory Visit: Payer: Self-pay | Admitting: Neurology

## 2018-01-28 DIAGNOSIS — R040 Epistaxis: Secondary | ICD-10-CM | POA: Diagnosis not present

## 2018-01-28 DIAGNOSIS — I10 Essential (primary) hypertension: Secondary | ICD-10-CM | POA: Diagnosis not present

## 2018-01-28 DIAGNOSIS — R634 Abnormal weight loss: Secondary | ICD-10-CM | POA: Diagnosis not present

## 2018-01-28 DIAGNOSIS — R630 Anorexia: Secondary | ICD-10-CM | POA: Diagnosis not present

## 2018-02-23 DIAGNOSIS — R040 Epistaxis: Secondary | ICD-10-CM | POA: Diagnosis not present

## 2018-02-24 DIAGNOSIS — N179 Acute kidney failure, unspecified: Secondary | ICD-10-CM | POA: Diagnosis not present

## 2018-02-24 DIAGNOSIS — N183 Chronic kidney disease, stage 3 (moderate): Secondary | ICD-10-CM | POA: Diagnosis not present

## 2018-02-24 DIAGNOSIS — I701 Atherosclerosis of renal artery: Secondary | ICD-10-CM | POA: Diagnosis not present

## 2018-02-24 DIAGNOSIS — I129 Hypertensive chronic kidney disease with stage 1 through stage 4 chronic kidney disease, or unspecified chronic kidney disease: Secondary | ICD-10-CM | POA: Diagnosis not present

## 2018-02-24 DIAGNOSIS — E875 Hyperkalemia: Secondary | ICD-10-CM | POA: Diagnosis not present

## 2018-02-24 DIAGNOSIS — D631 Anemia in chronic kidney disease: Secondary | ICD-10-CM | POA: Diagnosis not present

## 2018-02-24 DIAGNOSIS — M329 Systemic lupus erythematosus, unspecified: Secondary | ICD-10-CM | POA: Diagnosis not present

## 2018-03-02 DIAGNOSIS — R040 Epistaxis: Secondary | ICD-10-CM | POA: Diagnosis not present

## 2018-03-02 DIAGNOSIS — I10 Essential (primary) hypertension: Secondary | ICD-10-CM | POA: Diagnosis not present

## 2018-03-02 DIAGNOSIS — R630 Anorexia: Secondary | ICD-10-CM | POA: Diagnosis not present

## 2018-03-02 DIAGNOSIS — R634 Abnormal weight loss: Secondary | ICD-10-CM | POA: Diagnosis not present

## 2018-03-09 DIAGNOSIS — Z87898 Personal history of other specified conditions: Secondary | ICD-10-CM | POA: Diagnosis not present

## 2018-04-07 DIAGNOSIS — E875 Hyperkalemia: Secondary | ICD-10-CM | POA: Diagnosis not present

## 2018-04-07 DIAGNOSIS — M810 Age-related osteoporosis without current pathological fracture: Secondary | ICD-10-CM | POA: Diagnosis not present

## 2018-04-07 DIAGNOSIS — Z79899 Other long term (current) drug therapy: Secondary | ICD-10-CM | POA: Diagnosis not present

## 2018-04-07 DIAGNOSIS — D649 Anemia, unspecified: Secondary | ICD-10-CM | POA: Diagnosis not present

## 2018-04-07 DIAGNOSIS — I10 Essential (primary) hypertension: Secondary | ICD-10-CM | POA: Diagnosis not present

## 2018-04-07 DIAGNOSIS — N183 Chronic kidney disease, stage 3 (moderate): Secondary | ICD-10-CM | POA: Diagnosis not present

## 2018-05-24 DIAGNOSIS — M5136 Other intervertebral disc degeneration, lumbar region: Secondary | ICD-10-CM | POA: Diagnosis not present

## 2018-05-24 DIAGNOSIS — S22000D Wedge compression fracture of unspecified thoracic vertebra, subsequent encounter for fracture with routine healing: Secondary | ICD-10-CM | POA: Diagnosis not present

## 2018-05-24 DIAGNOSIS — N183 Chronic kidney disease, stage 3 (moderate): Secondary | ICD-10-CM | POA: Diagnosis not present

## 2018-05-24 DIAGNOSIS — Z5181 Encounter for therapeutic drug level monitoring: Secondary | ICD-10-CM | POA: Diagnosis not present

## 2018-05-24 DIAGNOSIS — G629 Polyneuropathy, unspecified: Secondary | ICD-10-CM | POA: Diagnosis not present

## 2018-05-24 DIAGNOSIS — D649 Anemia, unspecified: Secondary | ICD-10-CM | POA: Diagnosis not present

## 2018-05-24 DIAGNOSIS — K7689 Other specified diseases of liver: Secondary | ICD-10-CM | POA: Diagnosis not present

## 2018-05-24 DIAGNOSIS — I10 Essential (primary) hypertension: Secondary | ICD-10-CM | POA: Diagnosis not present

## 2018-05-24 DIAGNOSIS — Z79899 Other long term (current) drug therapy: Secondary | ICD-10-CM | POA: Diagnosis not present

## 2018-05-24 DIAGNOSIS — F419 Anxiety disorder, unspecified: Secondary | ICD-10-CM | POA: Diagnosis not present

## 2018-07-13 DIAGNOSIS — M1990 Unspecified osteoarthritis, unspecified site: Secondary | ICD-10-CM | POA: Diagnosis not present

## 2018-07-13 DIAGNOSIS — M810 Age-related osteoporosis without current pathological fracture: Secondary | ICD-10-CM | POA: Diagnosis not present

## 2018-07-13 DIAGNOSIS — Z79899 Other long term (current) drug therapy: Secondary | ICD-10-CM | POA: Diagnosis not present

## 2018-07-13 DIAGNOSIS — M5136 Other intervertebral disc degeneration, lumbar region: Secondary | ICD-10-CM | POA: Diagnosis not present

## 2018-07-13 DIAGNOSIS — M358 Other specified systemic involvement of connective tissue: Secondary | ICD-10-CM | POA: Diagnosis not present

## 2018-08-03 ENCOUNTER — Telehealth: Payer: Self-pay | Admitting: Gastroenterology

## 2018-08-06 ENCOUNTER — Encounter: Payer: Self-pay | Admitting: Gastroenterology

## 2018-08-17 ENCOUNTER — Other Ambulatory Visit: Payer: Self-pay | Admitting: Internal Medicine

## 2018-08-17 DIAGNOSIS — Z1231 Encounter for screening mammogram for malignant neoplasm of breast: Secondary | ICD-10-CM

## 2018-08-18 DIAGNOSIS — I10 Essential (primary) hypertension: Secondary | ICD-10-CM | POA: Diagnosis not present

## 2018-08-18 DIAGNOSIS — N183 Chronic kidney disease, stage 3 (moderate): Secondary | ICD-10-CM | POA: Diagnosis not present

## 2018-08-18 DIAGNOSIS — M358 Other specified systemic involvement of connective tissue: Secondary | ICD-10-CM | POA: Diagnosis not present

## 2018-08-18 DIAGNOSIS — M351 Other overlap syndromes: Secondary | ICD-10-CM | POA: Diagnosis not present

## 2018-08-18 DIAGNOSIS — D649 Anemia, unspecified: Secondary | ICD-10-CM | POA: Diagnosis not present

## 2018-08-18 DIAGNOSIS — M810 Age-related osteoporosis without current pathological fracture: Secondary | ICD-10-CM | POA: Diagnosis not present

## 2018-08-18 DIAGNOSIS — M5136 Other intervertebral disc degeneration, lumbar region: Secondary | ICD-10-CM | POA: Diagnosis not present

## 2018-08-25 DIAGNOSIS — Z780 Asymptomatic menopausal state: Secondary | ICD-10-CM | POA: Diagnosis not present

## 2018-08-25 DIAGNOSIS — Z79899 Other long term (current) drug therapy: Secondary | ICD-10-CM | POA: Diagnosis not present

## 2018-08-25 DIAGNOSIS — I10 Essential (primary) hypertension: Secondary | ICD-10-CM | POA: Diagnosis not present

## 2018-08-25 DIAGNOSIS — F419 Anxiety disorder, unspecified: Secondary | ICD-10-CM | POA: Diagnosis not present

## 2018-08-25 DIAGNOSIS — D649 Anemia, unspecified: Secondary | ICD-10-CM | POA: Diagnosis not present

## 2018-08-25 DIAGNOSIS — M351 Other overlap syndromes: Secondary | ICD-10-CM | POA: Diagnosis not present

## 2018-09-06 ENCOUNTER — Ambulatory Visit
Admission: RE | Admit: 2018-09-06 | Discharge: 2018-09-06 | Disposition: A | Discharge status: Home / Self Care | Payer: PPO | Source: Ambulatory Visit | Attending: Internal Medicine | Admitting: Internal Medicine

## 2018-09-06 ENCOUNTER — Other Ambulatory Visit: Payer: Self-pay

## 2018-09-06 DIAGNOSIS — Z1231 Encounter for screening mammogram for malignant neoplasm of breast: Secondary | ICD-10-CM

## 2018-09-23 DIAGNOSIS — H2513 Age-related nuclear cataract, bilateral: Secondary | ICD-10-CM | POA: Diagnosis not present

## 2018-09-23 DIAGNOSIS — H35033 Hypertensive retinopathy, bilateral: Secondary | ICD-10-CM | POA: Diagnosis not present

## 2018-09-23 DIAGNOSIS — Z79899 Other long term (current) drug therapy: Secondary | ICD-10-CM | POA: Diagnosis not present

## 2018-09-23 DIAGNOSIS — H43813 Vitreous degeneration, bilateral: Secondary | ICD-10-CM | POA: Diagnosis not present

## 2018-10-06 DIAGNOSIS — Z1159 Encounter for screening for other viral diseases: Secondary | ICD-10-CM | POA: Diagnosis not present

## 2018-10-06 DIAGNOSIS — I701 Atherosclerosis of renal artery: Secondary | ICD-10-CM | POA: Diagnosis not present

## 2018-10-06 DIAGNOSIS — N183 Chronic kidney disease, stage 3 (moderate): Secondary | ICD-10-CM | POA: Diagnosis not present

## 2018-10-06 DIAGNOSIS — I129 Hypertensive chronic kidney disease with stage 1 through stage 4 chronic kidney disease, or unspecified chronic kidney disease: Secondary | ICD-10-CM | POA: Diagnosis not present

## 2018-10-06 DIAGNOSIS — M329 Systemic lupus erythematosus, unspecified: Secondary | ICD-10-CM | POA: Diagnosis not present

## 2018-10-06 DIAGNOSIS — D631 Anemia in chronic kidney disease: Secondary | ICD-10-CM | POA: Diagnosis not present

## 2018-10-06 DIAGNOSIS — N179 Acute kidney failure, unspecified: Secondary | ICD-10-CM | POA: Diagnosis not present

## 2018-10-06 DIAGNOSIS — E875 Hyperkalemia: Secondary | ICD-10-CM | POA: Diagnosis not present

## 2018-11-09 DIAGNOSIS — H2513 Age-related nuclear cataract, bilateral: Secondary | ICD-10-CM | POA: Diagnosis not present

## 2018-11-25 DIAGNOSIS — H25811 Combined forms of age-related cataract, right eye: Secondary | ICD-10-CM | POA: Diagnosis not present

## 2018-11-25 DIAGNOSIS — H2511 Age-related nuclear cataract, right eye: Secondary | ICD-10-CM | POA: Diagnosis not present

## 2018-12-22 DIAGNOSIS — R040 Epistaxis: Secondary | ICD-10-CM | POA: Diagnosis not present

## 2019-01-13 ENCOUNTER — Ambulatory Visit: Payer: PPO | Attending: Internal Medicine

## 2019-01-13 DIAGNOSIS — Z20822 Contact with and (suspected) exposure to covid-19: Secondary | ICD-10-CM

## 2019-01-15 LAB — NOVEL CORONAVIRUS, NAA: SARS-CoV-2, NAA: NOT DETECTED

## 2019-01-20 DIAGNOSIS — H25812 Combined forms of age-related cataract, left eye: Secondary | ICD-10-CM | POA: Diagnosis not present

## 2019-01-20 DIAGNOSIS — H2512 Age-related nuclear cataract, left eye: Secondary | ICD-10-CM | POA: Diagnosis not present

## 2019-03-18 DIAGNOSIS — I10 Essential (primary) hypertension: Secondary | ICD-10-CM | POA: Diagnosis not present

## 2019-03-18 DIAGNOSIS — D649 Anemia, unspecified: Secondary | ICD-10-CM | POA: Diagnosis not present

## 2019-03-18 DIAGNOSIS — Z79899 Other long term (current) drug therapy: Secondary | ICD-10-CM | POA: Diagnosis not present

## 2019-03-25 DIAGNOSIS — E611 Iron deficiency: Secondary | ICD-10-CM | POA: Diagnosis not present

## 2019-03-25 DIAGNOSIS — Z01419 Encounter for gynecological examination (general) (routine) without abnormal findings: Secondary | ICD-10-CM | POA: Diagnosis not present

## 2019-03-25 DIAGNOSIS — D649 Anemia, unspecified: Secondary | ICD-10-CM | POA: Diagnosis not present

## 2019-03-25 DIAGNOSIS — I10 Essential (primary) hypertension: Secondary | ICD-10-CM | POA: Diagnosis not present

## 2019-03-25 DIAGNOSIS — Z0000 Encounter for general adult medical examination without abnormal findings: Secondary | ICD-10-CM | POA: Diagnosis not present

## 2019-03-25 DIAGNOSIS — N183 Chronic kidney disease, stage 3 unspecified: Secondary | ICD-10-CM | POA: Diagnosis not present

## 2019-03-25 DIAGNOSIS — K219 Gastro-esophageal reflux disease without esophagitis: Secondary | ICD-10-CM | POA: Diagnosis not present

## 2019-03-25 DIAGNOSIS — Z79899 Other long term (current) drug therapy: Secondary | ICD-10-CM | POA: Diagnosis not present

## 2019-03-28 DIAGNOSIS — N183 Chronic kidney disease, stage 3 unspecified: Secondary | ICD-10-CM | POA: Diagnosis not present

## 2019-03-28 DIAGNOSIS — D631 Anemia in chronic kidney disease: Secondary | ICD-10-CM | POA: Diagnosis not present

## 2019-03-28 DIAGNOSIS — E875 Hyperkalemia: Secondary | ICD-10-CM | POA: Diagnosis not present

## 2019-03-28 DIAGNOSIS — I129 Hypertensive chronic kidney disease with stage 1 through stage 4 chronic kidney disease, or unspecified chronic kidney disease: Secondary | ICD-10-CM | POA: Diagnosis not present

## 2019-03-28 DIAGNOSIS — I701 Atherosclerosis of renal artery: Secondary | ICD-10-CM | POA: Diagnosis not present

## 2019-03-28 DIAGNOSIS — N179 Acute kidney failure, unspecified: Secondary | ICD-10-CM | POA: Diagnosis not present

## 2019-03-28 DIAGNOSIS — M329 Systemic lupus erythematosus, unspecified: Secondary | ICD-10-CM | POA: Diagnosis not present

## 2019-03-31 DIAGNOSIS — M25572 Pain in left ankle and joints of left foot: Secondary | ICD-10-CM | POA: Diagnosis not present

## 2019-03-31 DIAGNOSIS — Z79899 Other long term (current) drug therapy: Secondary | ICD-10-CM | POA: Diagnosis not present

## 2019-03-31 DIAGNOSIS — M25571 Pain in right ankle and joints of right foot: Secondary | ICD-10-CM | POA: Diagnosis not present

## 2019-03-31 DIAGNOSIS — M810 Age-related osteoporosis without current pathological fracture: Secondary | ICD-10-CM | POA: Diagnosis not present

## 2019-03-31 DIAGNOSIS — M1990 Unspecified osteoarthritis, unspecified site: Secondary | ICD-10-CM | POA: Diagnosis not present

## 2019-03-31 DIAGNOSIS — M79672 Pain in left foot: Secondary | ICD-10-CM | POA: Diagnosis not present

## 2019-03-31 DIAGNOSIS — M5136 Other intervertebral disc degeneration, lumbar region: Secondary | ICD-10-CM | POA: Diagnosis not present

## 2019-03-31 DIAGNOSIS — M25579 Pain in unspecified ankle and joints of unspecified foot: Secondary | ICD-10-CM | POA: Diagnosis not present

## 2019-03-31 DIAGNOSIS — M7989 Other specified soft tissue disorders: Secondary | ICD-10-CM | POA: Diagnosis not present

## 2019-03-31 DIAGNOSIS — N183 Chronic kidney disease, stage 3 unspecified: Secondary | ICD-10-CM | POA: Diagnosis not present

## 2019-03-31 DIAGNOSIS — M3589 Other specified systemic involvement of connective tissue: Secondary | ICD-10-CM | POA: Diagnosis not present

## 2019-03-31 DIAGNOSIS — M79671 Pain in right foot: Secondary | ICD-10-CM | POA: Diagnosis not present

## 2019-03-31 DIAGNOSIS — D649 Anemia, unspecified: Secondary | ICD-10-CM | POA: Diagnosis not present

## 2019-04-18 DIAGNOSIS — I129 Hypertensive chronic kidney disease with stage 1 through stage 4 chronic kidney disease, or unspecified chronic kidney disease: Secondary | ICD-10-CM | POA: Diagnosis not present

## 2019-04-18 DIAGNOSIS — D631 Anemia in chronic kidney disease: Secondary | ICD-10-CM | POA: Diagnosis not present

## 2019-04-18 DIAGNOSIS — N183 Chronic kidney disease, stage 3 unspecified: Secondary | ICD-10-CM | POA: Diagnosis not present

## 2019-04-18 DIAGNOSIS — N189 Chronic kidney disease, unspecified: Secondary | ICD-10-CM | POA: Diagnosis not present

## 2019-04-20 DIAGNOSIS — M79604 Pain in right leg: Secondary | ICD-10-CM | POA: Diagnosis not present

## 2019-04-20 DIAGNOSIS — M79605 Pain in left leg: Secondary | ICD-10-CM | POA: Diagnosis not present

## 2019-04-20 DIAGNOSIS — G629 Polyneuropathy, unspecified: Secondary | ICD-10-CM | POA: Diagnosis not present

## 2019-05-04 DIAGNOSIS — I129 Hypertensive chronic kidney disease with stage 1 through stage 4 chronic kidney disease, or unspecified chronic kidney disease: Secondary | ICD-10-CM | POA: Diagnosis not present

## 2019-05-04 DIAGNOSIS — D631 Anemia in chronic kidney disease: Secondary | ICD-10-CM | POA: Diagnosis not present

## 2019-05-04 DIAGNOSIS — N183 Chronic kidney disease, stage 3 unspecified: Secondary | ICD-10-CM | POA: Diagnosis not present

## 2019-05-04 DIAGNOSIS — I701 Atherosclerosis of renal artery: Secondary | ICD-10-CM | POA: Diagnosis not present

## 2019-05-04 DIAGNOSIS — M329 Systemic lupus erythematosus, unspecified: Secondary | ICD-10-CM | POA: Diagnosis not present

## 2019-05-04 DIAGNOSIS — N179 Acute kidney failure, unspecified: Secondary | ICD-10-CM | POA: Diagnosis not present

## 2019-05-27 DIAGNOSIS — S299XXA Unspecified injury of thorax, initial encounter: Secondary | ICD-10-CM | POA: Diagnosis not present

## 2019-05-27 DIAGNOSIS — Z9181 History of falling: Secondary | ICD-10-CM | POA: Diagnosis not present

## 2019-05-27 DIAGNOSIS — S79911A Unspecified injury of right hip, initial encounter: Secondary | ICD-10-CM | POA: Diagnosis not present

## 2019-05-27 DIAGNOSIS — M25551 Pain in right hip: Secondary | ICD-10-CM | POA: Diagnosis not present

## 2019-05-27 DIAGNOSIS — S3992XA Unspecified injury of lower back, initial encounter: Secondary | ICD-10-CM | POA: Diagnosis not present

## 2019-05-27 DIAGNOSIS — S79912A Unspecified injury of left hip, initial encounter: Secondary | ICD-10-CM | POA: Diagnosis not present

## 2019-05-27 DIAGNOSIS — M545 Low back pain: Secondary | ICD-10-CM | POA: Diagnosis not present

## 2019-05-27 DIAGNOSIS — M25552 Pain in left hip: Secondary | ICD-10-CM | POA: Diagnosis not present

## 2019-05-27 DIAGNOSIS — R0781 Pleurodynia: Secondary | ICD-10-CM | POA: Diagnosis not present

## 2019-05-30 ENCOUNTER — Ambulatory Visit: Payer: PPO | Admitting: Neurology

## 2019-05-30 ENCOUNTER — Telehealth: Payer: Self-pay | Admitting: Neurology

## 2019-05-30 ENCOUNTER — Encounter: Payer: Self-pay | Admitting: Neurology

## 2019-05-30 ENCOUNTER — Other Ambulatory Visit: Payer: Self-pay

## 2019-05-30 VITALS — BP 165/68 | HR 78 | Ht 61.0 in | Wt 96.5 lb

## 2019-05-30 DIAGNOSIS — M545 Low back pain, unspecified: Secondary | ICD-10-CM | POA: Insufficient documentation

## 2019-05-30 DIAGNOSIS — R202 Paresthesia of skin: Secondary | ICD-10-CM

## 2019-05-30 DIAGNOSIS — R269 Unspecified abnormalities of gait and mobility: Secondary | ICD-10-CM

## 2019-05-30 NOTE — Telephone Encounter (Signed)
Health team order sent to GI. No auth they will reach out to the patient to schedule.  

## 2019-05-30 NOTE — Progress Notes (Signed)
PATIENT: Holly Jimenez DOB: 1945/04/08  Chief Complaint  Patient presents with  . New Patient (Initial Visit)    RM EMG 4. Having neuropathy iin both thighs for the last 4-5 months. Currently on Lyrica. Has improved on Lryica and magnesium oxide. She requested a nuero work up from PCP  . Gait Problem    Ambulates with cane but in WC in office today. She was able to stand to get her weight.   . Fall    Has had about 6 falls in the last year. Most recent fall this past Wednesday. Denies hitting head. Dr. Maudie Mercury did xrays, she has not heard about results yet.      HISTORICAL  Holly Jimenez is a 74 year old female, seen in request by her primary care physician Dr. Jani Gravel for evaluation of ascending bilateral lower extremity paresthesia, gait abnormality, initial evaluation was on May 30, 2019, she is accompanied by her son at today's clinical visit.  I have reviewed and summarized the referring note from the referring physician.  She had a history of lupus disease, taking Plaquenil, low-dose prednisone, she presented with skin lesion, joint pain, lupus is overall under good control, she also had a history of hypertension, hyperlipidemia, chronic low back pain, is on chronic narcotic treatment, hydrocodone/Tylenol 5/325 mg 4 times a day  I saw her initially in October 2018, after she fell, bumped her left temporal region at the corner of her furniture, she had persistent headache since then, I personally reviewed MRI of brain on July 14, 2016, 2 mm subdural hematoma over right hemisphere, she was given Topamax 150 mg twice a day for headache, which has been very helpful,  She has lost follow-up since last visit in October 2018, today she came in with 6 months history of ascending bilateral lower extremity paresthesia  She carries a diagnosis of peripheral neuropathy for many years, presented with bilateral lower extremity pain, taking Lyrica, which has been helpful, she  also noticed gradual onset steady gait over the past few years, fell few times, most recent fall was few days ago, she was holding onto handrail is at the top of her 3 step deck then suddenly she fell backwards without clear reasons," as if somebody has pushed me backwards", she landed on her buttock and lower back, now complains of significant left-sided chest wall pain, had x-ray by her primary care physician, reported pending  Around November 2020, she began to notice needle prick sensation getting worse at the bottom of her feet, over 2 weeks course, it quickly ascending to proximal thigh area, she also noticed worsening gait abnormality, at its worse, she has to rely on her walker, she denies bowel and bladder incontinence, denies upper extremity involvement, her symptoms plan to do around early 2021, over the past few months, she has mild improvement, now she has to came to clinic visit in wheelchair due to her recent fall, left chest wall pain,  I personally reviewed CT head without contrast October 2019 no acute abnormality, chronic supratentorium small vessel disease  Laboratory evaluations from primary care physician in April 2021, creatinine 1.2,  REVIEW OF SYSTEMS: Full 14 system review of systems performed and notable only for as above All other review of systems were negative.  ALLERGIES: Allergies  Allergen Reactions  . Sulfonamide Derivatives Hives    HOME MEDICATIONS: Current Outpatient Medications  Medication Sig Dispense Refill  . ALPRAZolam (XANAX) 0.25 MG tablet Take 0.25 mg by mouth 3 (three)  times daily as needed for anxiety.     Marland Kitchen amLODipine (NORVASC) 2.5 MG tablet Take 2.5 mg by mouth daily.  0  . aspirin EC 81 MG tablet Take 81 mg by mouth daily.    . cetirizine (ZYRTEC) 5 MG tablet Take 5 mg by mouth daily as needed for allergies.     . cholecalciferol (VITAMIN D) 1000 UNITS tablet Take 1,000 Units by mouth daily.    . ferrous gluconate (FERGON) 240 (27 FE) MG  tablet Take 240 mg by mouth 2 (two) times daily.    Marland Kitchen HYDROcodone-acetaminophen (NORCO/VICODIN) 5-325 MG per tablet Take 1 tablet by mouth every 6 (six) hours as needed for moderate pain.    . hydroxychloroquine (PLAQUENIL) 200 MG tablet Take 200 mg by mouth 2 (two) times daily.     Marland Kitchen ketoconazole (NIZORAL) 2 % cream Apply 1 application topically daily as needed (infection treatment).     Marland Kitchen lidocaine (LIDODERM) 5 % Place 1 patch onto the skin daily as needed (pain). Remove & Discard patch within 12 hours or as directed by MD    . Multiple Vitamin (MULTIVITAMIN WITH MINERALS) TABS tablet Take 1 tablet by mouth daily.    Marland Kitchen omeprazole (PRILOSEC) 20 MG capsule Take 20 mg by mouth 2 (two) times daily.     Marland Kitchen PHENADOZ 25 MG suppository Place 25 mg rectally every 6 (six) hours as needed for nausea or vomiting.   0  . predniSONE (DELTASONE) 5 MG tablet Take 5 mg by mouth every other day. continuous    . pregabalin (LYRICA) 75 MG capsule Take 75 mg by mouth 2 (two) times daily.    Marland Kitchen pyridOXINE (VITAMIN B-6) 100 MG tablet Take 100 mg by mouth daily.    . Tetrahydrozoline HCl (VISINE OP) Place 2 drops into both eyes as needed (for dry eyes).    . topiramate (TOPAMAX) 100 MG tablet Take 1 tablet (100 mg total) by mouth 2 (two) times daily. Please call to schedule follow up or may request refills from PCP. 60 tablet 1  . topiramate (TOPAMAX) 50 MG tablet Take 50 mg by mouth 2 (two) times daily.  0  . vitamin B-12 (CYANOCOBALAMIN) 1000 MCG tablet Take 1,000 mcg by mouth daily.     No current facility-administered medications for this visit.    PAST MEDICAL HISTORY: Past Medical History:  Diagnosis Date  . Abnormal LFTs   . Allergic rhinitis   . Anemia   . Anxiety   . Arthritis   . Chronic back pain   . Chronic headaches   . Chronic kidney disease, unspecified   . DDD (degenerative disc disease)   . GERD (gastroesophageal reflux disease)   . Headache   . Hypercalcemia   . Hyperkalemia   .  Hyperlipidemia   . Hypertension    KIDNEY SPECIALIST TOOK OFF BENICAR  . IBS (irritable bowel syndrome)   . Lupus ([place])   . Mixed connective tissue disease ([place])   . Nausea   . Neuropathy   . Peptic ulcer disease   . Raynaud's disease   . Small kidney    left  . Vitamin D deficiency disease     PAST SURGICAL HISTORY: Past Surgical History:  Procedure Laterality Date  . ABDOMINAL HYSTERECTOMY  1975  . APPENDECTOMY  1972  . FOOT SURGERY     LEFT  . KYPHOPLASTY Bilateral 03/08/2014   Procedure: Thoracic nine Kyphoplasty;  Surgeon: Ashok Pall, MD;  Location: [facility];  Service:  Neurosurgery;  Laterality: Bilateral;  Thoracic nine Kyphoplasty  . LIPOMA EXCISION     back  . TONSILLECTOMY    . WRIST GANGLION EXCISION     left    FAMILY HISTORY: Family History  Problem Relation Age of Onset  . Ovarian cancer Sister   . Colon cancer Sister   . Heart disease Paternal Grandfather   . Heart disease Maternal Grandfather   . Kidney disease Brother   . Kidney disease Mother   . Hypertension Mother   . Kidney disease Maternal Grandmother   . Diabetes Paternal Grandmother   . Asthma Father   . Suicidality Father   . Alcoholism Father     SOCIAL HISTORY: Social History   Socioeconomic History  . Marital status: Divorced    Spouse name: Not on file  . Number of children: 1  . Years of education: Associates  . Highest education level: Not on file  Occupational History  . Occupation: retired    Fish farm manager: [employer]  Tobacco Use  . Smoking status: Former Smoker    Packs/day: 1.00    Years: 25.00    Pack years: 25.00    Types: Cigarettes    Quit date: 03/05/2000    Years since quitting: 19.2  . Smokeless tobacco: Never Used  Substance and Sexual Activity  . Alcohol use: Yes    Comment: Rare  . Drug use: No    Frequency: 2.0 times per week    Types: Marijuana    Comment: previous use of marijuana for pain  . Sexual activity: Not on file  Other Topics  Concern  . Not on file  Social History Narrative   Lives at home alone.   Right-handed.   Rare use of caffeine.   Social Determinants of Health   Financial Resource Strain:   . Difficulty of Paying Living Expenses:   Food Insecurity:   . Worried About Charity fundraiser in the Last Year:   . Arboriculturist in the Last Year:   Transportation Needs:   . Film/video editor (Medical):   Marland Kitchen Lack of Transportation (Non-Medical):   Physical Activity:   . Days of Exercise per Week:   . Minutes of Exercise per Session:   Stress:   . Feeling of Stress :   Social Connections:   . Frequency of Communication with Friends and Family:   . Frequency of Social Gatherings with Friends and Family:   . Attends Religious Services:   . Active Member of Clubs or Organizations:   . Attends Archivist Meetings:   Marland Kitchen Marital Status:   Intimate Partner Violence:   . Fear of Current or Ex-Partner:   . Emotionally Abused:   Marland Kitchen Physically Abused:   . Sexually Abused:      PHYSICAL EXAM   Vitals:   05/30/19 1306  BP: (!) 165/68  Pulse: 78  SpO2: 97%  Weight: 96 lb 8 oz (43.8 kg)  Height: 5\' 1"  (1.549 m)    Not recorded      Body mass index is 18.23 kg/m.  PHYSICAL EXAMNIATION:  Gen: NAD, conversant, well nourised, well groomed                     Cardiovascular: Regular rate rhythm, no peripheral edema, warm, nontender. Eyes: Conjunctivae clear without exudates or hemorrhage Neck: Supple, no carotid bruits. Pulmonary: Clear to auscultation bilaterally   NEUROLOGICAL EXAM:  MENTAL STATUS: Speech:    Speech  is normal; fluent and spontaneous with normal comprehension.  Cognition:     Orientation to time, place and person     Normal recent and remote memory     Normal Attention span and concentration     Normal Language, naming, repeating,spontaneous speech     Fund of knowledge   CRANIAL NERVES: CN II: Visual fields are full to confrontation. Pupils are round  equal and briskly reactive to light. CN III, IV, VI: extraocular movement are normal. No ptosis. CN V: Facial sensation is intact to light touch CN VII: Face is symmetric with normal eye closure  CN VIII: Hearing is normal to causal conversation. CN IX, X: Phonation is normal. CN XI: Head turning and shoulder shrug are intact  MOTOR: Normal muscle tone, she has mild right shoulder abduction, external rotation weakness, there was no significant bilateral lower extremity proximal muscle weakness noted, she has mild bilateral toe flexion extension weakness, she has no rigidity or bradykinesia  REFLEXES: Reflexes are absent and symmetric at the biceps, triceps, knees, and ankles. Plantar responses are flexor.  SENSORY: Length dependent decreased light touch, pinprick to mid shin level, decreased vibratory sensation to knee level   COORDINATION: There is no trunk or limb dysmetria noted.  GAIT/STANCE: She needs push-up to get up from seated position, unsteady, leaning backwards,    DIAGNOSTIC DATA (LABS, IMAGING, TESTING) - I reviewed patient records, labs, notes, testing and imaging myself where available.   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 Ph.D.  [facility] [address], [place], [place] [address] Ph: [phone] Fax:  [phone]  CC: Referring Provider

## 2019-05-31 ENCOUNTER — Telehealth: Payer: Self-pay | Admitting: Neurology

## 2019-05-31 DIAGNOSIS — R7989 Other specified abnormal findings of blood chemistry: Secondary | ICD-10-CM | POA: Insufficient documentation

## 2019-05-31 NOTE — Telephone Encounter (Signed)
Pt has returned call to [place], Therapist, sports.  Please call

## 2019-05-31 NOTE — Addendum Note (Signed)
Addended by: Marcial Pacas on: 05/31/2019 04:37 PM   Modules accepted: Orders

## 2019-05-31 NOTE — Telephone Encounter (Signed)
Have ordered repeat thyroid functional test

## 2019-05-31 NOTE — Telephone Encounter (Signed)
I left second message on her home number asking for a return call. No answer or voicemail available on mobile number.

## 2019-05-31 NOTE — Telephone Encounter (Signed)
Left message requesting a return call.

## 2019-05-31 NOTE — Telephone Encounter (Signed)
I spoke to the patient and she has been informed of her results. She is agreeable to have repeat labs and would like to come to our office.

## 2019-05-31 NOTE — Telephone Encounter (Signed)
Please call patient, laboratory evaluation showed significantly decrease the TSH, she can come back to our office for repeat thyroid functional test, if confirmed true abnormality, I need to refer her to endocrinologist.  The other choice is I also faxed the TSH report to her primary care physician, she may start it with her primary care physician Jani Gravel.

## 2019-06-02 LAB — COMPREHENSIVE METABOLIC PANEL
ALT: 22 IU/L (ref 0–32)
AST: 31 IU/L (ref 0–40)
Albumin/Globulin Ratio: 1.4 (ref 1.2–2.2)
Albumin: 4.2 g/dL (ref 3.7–4.7)
Alkaline Phosphatase: 88 IU/L (ref 48–121)
BUN/Creatinine Ratio: 15 (ref 12–28)
BUN: 21 mg/dL (ref 8–27)
Bilirubin Total: 0.3 mg/dL (ref 0.0–1.2)
CO2: 20 mmol/L (ref 20–29)
Calcium: 10 mg/dL (ref 8.7–10.3)
Chloride: 105 mmol/L (ref 96–106)
Creatinine, Ser: 1.44 mg/dL — ABNORMAL HIGH (ref 0.57–1.00)
GFR calc Af Amer: 41 mL/min/{1.73_m2} — ABNORMAL LOW (ref >59)
GFR calc non Af Amer: 36 mL/min/{1.73_m2} — ABNORMAL LOW (ref >59)
Globulin, Total: 3 g/dL (ref 1.5–4.5)
Glucose: 79 mg/dL (ref 65–99)
Potassium: 4.9 mmol/L (ref 3.5–5.2)
Sodium: 139 mmol/L (ref 134–144)
Total Protein: 7.2 g/dL (ref 6.0–8.5)

## 2019-06-02 LAB — ANA W/REFLEX IF POSITIVE
Anti JO-1: <0.2 AI (ref 0.0–0.9)
Anti Nuclear Antibody (ANA): POSITIVE — AB
Centromere Ab Screen: <0.2 AI (ref 0.0–0.9)
Chromatin Ab SerPl-aCnc: 0.7 AI (ref 0.0–0.9)
ENA RNP Ab: 7.2 AI — ABNORMAL HIGH (ref 0.0–0.9)
ENA SM Ab Ser-aCnc: 0.3 AI (ref 0.0–0.9)
ENA SSA (RO) Ab: <0.2 AI (ref 0.0–0.9)
ENA SSB (LA) Ab: <0.2 AI (ref 0.0–0.9)
Scleroderma (Scl-70) (ENA) Antibody, IgG: <0.2 AI (ref 0.0–0.9)
dsDNA Ab: <1 IU/mL (ref 0–9)

## 2019-06-02 LAB — MULTIPLE MYELOMA PANEL, SERUM
Albumin SerPl Elph-Mcnc: 3.4 g/dL (ref 2.9–4.4)
Albumin/Glob SerPl: 0.9 (ref 0.7–1.7)
Alpha 1: 0.4 g/dL (ref 0.0–0.4)
Alpha2 Glob SerPl Elph-Mcnc: 1.1 g/dL — ABNORMAL HIGH (ref 0.4–1.0)
B-Globulin SerPl Elph-Mcnc: 1.1 g/dL (ref 0.7–1.3)
Gamma Glob SerPl Elph-Mcnc: 1.2 g/dL (ref 0.4–1.8)
Globulin, Total: 3.8 g/dL (ref 2.2–3.9)
IgA/Immunoglobulin A, Serum: 330 mg/dL (ref 64–422)
IgG (Immunoglobin G), Serum: 1263 mg/dL (ref 586–1602)
IgM (Immunoglobulin M), Srm: 75 mg/dL (ref 26–217)

## 2019-06-02 LAB — CBC WITH DIFFERENTIAL/PLATELET
Basophils Absolute: 0 10*3/uL (ref 0.0–0.2)
Basos: 1 %
EOS (ABSOLUTE): 0.1 10*3/uL (ref 0.0–0.4)
Eos: 1 %
Hematocrit: 36.6 % (ref 34.0–46.6)
Hemoglobin: 11.3 g/dL (ref 11.1–15.9)
Immature Grans (Abs): 0 10*3/uL (ref 0.0–0.1)
Immature Granulocytes: 0 %
Lymphocytes Absolute: 1.6 10*3/uL (ref 0.7–3.1)
Lymphs: 21 %
MCH: 23.6 pg — ABNORMAL LOW (ref 26.6–33.0)
MCHC: 30.9 g/dL — ABNORMAL LOW (ref 31.5–35.7)
MCV: 76 fL — ABNORMAL LOW (ref 79–97)
Monocytes Absolute: 0.4 10*3/uL (ref 0.1–0.9)
Monocytes: 5 %
Neutrophils Absolute: 5.7 10*3/uL (ref 1.4–7.0)
Neutrophils: 72 %
Platelets: 300 10*3/uL (ref 150–450)
RBC: 4.79 x10E6/uL (ref 3.77–5.28)
RDW: 15.1 % (ref 11.7–15.4)
WBC: 7.8 10*3/uL (ref 3.4–10.8)

## 2019-06-02 LAB — VITAMIN B12: Vitamin B-12: >2000 pg/mL — ABNORMAL HIGH (ref 232–1245)

## 2019-06-02 LAB — TSH: TSH: 0.022 u[IU]/mL — ABNORMAL LOW (ref 0.450–4.500)

## 2019-06-02 LAB — VITAMIN D 25 HYDROXY (VIT D DEFICIENCY, FRACTURES): Vit D, 25-Hydroxy: 43.8 ng/mL (ref 30.0–100.0)

## 2019-06-02 LAB — C-REACTIVE PROTEIN: CRP: 41 mg/L — ABNORMAL HIGH (ref 0–10)

## 2019-06-02 LAB — CK: Total CK: 145 U/L (ref 32–182)

## 2019-06-02 LAB — HGB A1C W/O EAG: Hgb A1c MFr Bld: 5.5 % (ref 4.8–5.6)

## 2019-06-02 LAB — RPR: RPR Ser Ql: NONREACTIVE

## 2019-06-08 ENCOUNTER — Telehealth: Payer: Self-pay | Admitting: Neurology

## 2019-06-08 DIAGNOSIS — I129 Hypertensive chronic kidney disease with stage 1 through stage 4 chronic kidney disease, or unspecified chronic kidney disease: Secondary | ICD-10-CM | POA: Diagnosis not present

## 2019-06-08 DIAGNOSIS — M545 Low back pain, unspecified: Secondary | ICD-10-CM

## 2019-06-08 DIAGNOSIS — R269 Unspecified abnormalities of gait and mobility: Secondary | ICD-10-CM

## 2019-06-08 DIAGNOSIS — G8929 Other chronic pain: Secondary | ICD-10-CM | POA: Diagnosis not present

## 2019-06-08 DIAGNOSIS — E785 Hyperlipidemia, unspecified: Secondary | ICD-10-CM | POA: Diagnosis not present

## 2019-06-08 DIAGNOSIS — Z9181 History of falling: Secondary | ICD-10-CM | POA: Diagnosis not present

## 2019-06-08 DIAGNOSIS — K589 Irritable bowel syndrome without diarrhea: Secondary | ICD-10-CM | POA: Diagnosis not present

## 2019-06-08 DIAGNOSIS — M329 Systemic lupus erythematosus, unspecified: Secondary | ICD-10-CM | POA: Diagnosis not present

## 2019-06-08 DIAGNOSIS — M519 Unspecified thoracic, thoracolumbar and lumbosacral intervertebral disc disorder: Secondary | ICD-10-CM | POA: Diagnosis not present

## 2019-06-08 DIAGNOSIS — R202 Paresthesia of skin: Secondary | ICD-10-CM

## 2019-06-08 DIAGNOSIS — K219 Gastro-esophageal reflux disease without esophagitis: Secondary | ICD-10-CM | POA: Diagnosis not present

## 2019-06-08 DIAGNOSIS — Z87891 Personal history of nicotine dependence: Secondary | ICD-10-CM | POA: Diagnosis not present

## 2019-06-08 DIAGNOSIS — J309 Allergic rhinitis, unspecified: Secondary | ICD-10-CM | POA: Diagnosis not present

## 2019-06-08 DIAGNOSIS — G629 Polyneuropathy, unspecified: Secondary | ICD-10-CM | POA: Diagnosis not present

## 2019-06-08 DIAGNOSIS — F419 Anxiety disorder, unspecified: Secondary | ICD-10-CM | POA: Diagnosis not present

## 2019-06-08 DIAGNOSIS — N189 Chronic kidney disease, unspecified: Secondary | ICD-10-CM | POA: Diagnosis not present

## 2019-06-08 DIAGNOSIS — I7300 Raynaud's syndrome without gangrene: Secondary | ICD-10-CM | POA: Diagnosis not present

## 2019-06-08 DIAGNOSIS — E559 Vitamin D deficiency, unspecified: Secondary | ICD-10-CM | POA: Diagnosis not present

## 2019-06-08 NOTE — Telephone Encounter (Addendum)
Returned call to Cindee Salt at [facility] and left message to proceed with PT orders as outlined below. Additionally, Dr. Krista Blue has signed a prescription for the 3 in 1 commode and it has been faxed to [facility] at [phone].

## 2019-06-08 NOTE — Telephone Encounter (Signed)
[name] PT for [facility] called needing VO for 4 times a week for 2 weeks and 4 times a week for 1 week He also states she is needing a 3 in 1 commode ordered Please advise.

## 2019-06-22 ENCOUNTER — Encounter: Payer: PPO | Admitting: Neurology

## 2019-06-25 ENCOUNTER — Other Ambulatory Visit: Payer: PPO

## 2019-06-27 DIAGNOSIS — I10 Essential (primary) hypertension: Secondary | ICD-10-CM | POA: Diagnosis not present

## 2019-06-27 DIAGNOSIS — E559 Vitamin D deficiency, unspecified: Secondary | ICD-10-CM | POA: Diagnosis not present

## 2019-06-27 DIAGNOSIS — N179 Acute kidney failure, unspecified: Secondary | ICD-10-CM | POA: Diagnosis not present

## 2019-06-28 DIAGNOSIS — M545 Low back pain: Secondary | ICD-10-CM | POA: Diagnosis not present

## 2019-06-28 DIAGNOSIS — R110 Nausea: Secondary | ICD-10-CM | POA: Diagnosis not present

## 2019-06-28 DIAGNOSIS — R0781 Pleurodynia: Secondary | ICD-10-CM | POA: Diagnosis not present

## 2019-06-30 ENCOUNTER — Ambulatory Visit
Admission: RE | Admit: 2019-06-30 | Discharge: 2019-06-30 | Disposition: A | Discharge status: Home / Self Care | Payer: PPO | Source: Ambulatory Visit | Attending: Neurology | Admitting: Neurology

## 2019-06-30 DIAGNOSIS — R202 Paresthesia of skin: Secondary | ICD-10-CM

## 2019-06-30 DIAGNOSIS — R269 Unspecified abnormalities of gait and mobility: Secondary | ICD-10-CM

## 2019-06-30 DIAGNOSIS — M545 Low back pain, unspecified: Secondary | ICD-10-CM

## 2019-06-30 DIAGNOSIS — M546 Pain in thoracic spine: Secondary | ICD-10-CM | POA: Diagnosis not present

## 2019-07-04 ENCOUNTER — Telehealth: Payer: Self-pay | Admitting: Neurology

## 2019-07-04 DIAGNOSIS — M545 Low back pain, unspecified: Secondary | ICD-10-CM

## 2019-07-04 DIAGNOSIS — Z8781 Personal history of (healed) traumatic fracture: Secondary | ICD-10-CM

## 2019-07-04 NOTE — Addendum Note (Signed)
Addended by: Noberto Retort C on: 07/04/2019 10:10 AM   Modules accepted: Orders

## 2019-07-04 NOTE — Telephone Encounter (Signed)
  IMPRESSION: This MRI of the lumbar spine shows the following: 1.   Acute 50% compression fracture of the T11 vertebral body and acute 20% compression of the L2 vertebral body.  Additionally there could be an acute Schmorl's node into the inferior endplate of L3. 2.   Chronic compression fractures of the T9 and L3 vertebral bodies.  There is cement signal consistent with prior augmentation of the T9 fracture. 3.   At L1-L2, there is mild spinal stenosis but there does not appear to be any nerve root compression. 4.   At L2-L3, there is mild spinal stenosis and moderately severe left foraminal and lateral recess stenosis with potential to compress the left L2 and L3 nerve roots. 5.   At L3-L4, there is moderately severe spinal stenosis and severe right lateral recess stenosis and moderately severe left lateral recess stenosis with potential for compression of either of the L4 nerve roots, especially on the right. 6.   At L4-L5, there is moderate spinal stenosis and moderately severe right foraminal and lateral recess stenosis with potential for right L4 and right L5 nerve root compression. 7.   At L5-S1, there is moderate spinal stenosis and moderately severe left foraminal narrowing and moderately severe bilateral lateral recess stenosis with potential for compression of the left L5 and both S1 nerve roots.   IMPRESSION: This MRI of the thoracic spine without contrast shows the following: 1.   The spinal cord appears normal. 2.   Acute compression fractures of the T8, T11 and L2 vertebral bodies. 3.   Chronic compression fracture of the T9 vertebral body with prior evidence of vertebral body augmentation. 4.   Multilevel degenerative changes as detailed above that does not lead to nerve root compression or spinal stenosis.  Please call patient, MRI of thoracic spine showed acute compression fracture of T8, T11 and T12 vertebral bodies, chronic compression fracture of T9 vertebral bodies, with  previous evidence of vertebral body augmentation  MRI of lumbar spine showed multilevel degenerative changes, moderately severe spinal stenosis at L3-4, L4-5, variable degree of foraminal narrowing  Please make sure she is on schedule for EMG nerve conduction study  Also check with her, if she has significant pain, if she does, may consider refer her to pain management

## 2019-07-04 NOTE — Telephone Encounter (Signed)
I spoke to the patient and she verbalized understanding of her results below. She has been scheduled for NCV/EMG on 07/06/19. She has significant pain, especially when up and moving around. She rates it at 10 at times. She would like to proceed with the pain management referral.

## 2019-07-05 NOTE — Telephone Encounter (Signed)
Referral has been sent to Pain MGT. CPR in Physician Review

## 2019-07-06 ENCOUNTER — Encounter: Payer: Self-pay | Admitting: Neurology

## 2019-07-06 ENCOUNTER — Telehealth: Payer: Self-pay | Admitting: *Deleted

## 2019-07-06 NOTE — Telephone Encounter (Signed)
No showed NCV/EMG appointment. 

## 2019-08-01 ENCOUNTER — Telehealth: Payer: Self-pay | Admitting: Neurology

## 2019-08-01 NOTE — Telephone Encounter (Signed)
I reached out to the pt's son (ok per dpr)  He wanted to verify what procedure Dr. Krista Blue had recommended based of the MRI's completed in June.   I advised Dr. Krista Blue had recommended she have a EMG/NCS completed. This study was originally scheduled for 07/06/2019 ( pt no showed appt) but has been reschedule for 09/07/2019.  Pt will proceed with NCS/EMG and appt has been added to the wait list.   Son is going to call pain management to see if he can speed up the process on getting the pt schedule. He reports he back pain as worsened some.

## 2019-08-01 NOTE — Telephone Encounter (Signed)
Patient's son Kathyrn Drown is calling and states his mother never heard about pain Mgt referral . I stated to [facility] called and left two messages with your mom to schedule no return phone call back . Kathyrn Drown is going to get her rescheduled for another apt provided him telephone number [phone] .    Kathyrn Drown would like a call back about details of his mothers MRI because his mother is having problems remembering . [phone] .

## 2019-08-09 ENCOUNTER — Encounter: Payer: Self-pay | Admitting: Physical Medicine and Rehabilitation

## 2019-09-07 ENCOUNTER — Encounter: Payer: PPO | Admitting: Neurology

## 2019-09-07 ENCOUNTER — Telehealth: Payer: Self-pay | Admitting: *Deleted

## 2019-09-07 ENCOUNTER — Other Ambulatory Visit: Payer: Self-pay

## 2019-09-07 NOTE — Telephone Encounter (Signed)
Arrived 15 minutes late for NCV/EMG and was rescheduled.

## 2019-09-13 ENCOUNTER — Ambulatory Visit: Payer: PPO | Admitting: Physical Medicine and Rehabilitation

## 2019-09-21 ENCOUNTER — Other Ambulatory Visit: Payer: Self-pay

## 2019-09-21 ENCOUNTER — Encounter: Payer: Self-pay | Admitting: Physical Medicine and Rehabilitation

## 2019-09-21 ENCOUNTER — Encounter: Payer: PPO | Attending: Physical Medicine and Rehabilitation | Admitting: Physical Medicine and Rehabilitation

## 2019-09-21 VITALS — BP 170/77 | HR 55 | Temp 98.6°F | Ht 61.0 in | Wt 96.0 lb

## 2019-09-21 DIAGNOSIS — M8000XA Age-related osteoporosis with current pathological fracture, unspecified site, initial encounter for fracture: Secondary | ICD-10-CM | POA: Insufficient documentation

## 2019-09-21 DIAGNOSIS — S22000A Wedge compression fracture of unspecified thoracic vertebra, initial encounter for closed fracture: Secondary | ICD-10-CM | POA: Insufficient documentation

## 2019-09-21 NOTE — Progress Notes (Signed)
Subjective:    Patient ID: Holly Jimenez, female    DOB: [birth year], 74 y.o.   MRN: [id number]  HPI She had a fall in April and had severe lower back pain.   MRI thoracic and lumbar spine reviewed and shows:  This MRI of the thoracic spine without contrast shows the following: 1.   The spinal cord appears normal. 2.   Acute compression fractures of the T8, T11 and L2 vertebral bodies. 3.   Chronic compression fracture of the T9 vertebral body with prior evidence of vertebral body augmentation. 4.   Multilevel degenerative changes as detailed above that does not lead to nerve root compression or spinal stenosis.  Her walking has improved since this happens but she is still limited. She walks around the house.   She has a diagnosis of osteoporosis. She is not on any medications for osteoporosis but has a referral to rheumatology.   Her son accompanies her today. He lives in [place] and is not with her every day.   She wants to move when she has a good day.   She has been having some decreased appetite.   Pain Inventory Average Pain 10 Pain Right Now 8 My pain is sharp, stabbing, tingling and aching  In the last 24 hours, has pain interfered with the following? General activity 10 Relation with others 8 Enjoyment of life 0 What TIME of day is your pain at its worst? night Sleep (in general) Poor  Pain is worse with: walking, bending and standing Pain improves with: heat/ice and medication Relief from Meds: 8  walk with assistance use a cane use a walker ability to climb steps?  no do you drive?  no Do you have any goals in this area?  yes  not employed: date last employed . retired I need assistance with the following:  dressing, bathing, meal prep and household duties  bladder control problems weakness tremor trouble walking spasms anxiety  new  new    Family History  Problem Relation Age of Onset  . Ovarian cancer Sister   . Colon  cancer Sister   . Heart disease Paternal Grandfather   . Heart disease Maternal Grandfather   . Kidney disease Brother   . Kidney disease Mother   . Hypertension Mother   . Kidney disease Maternal Grandmother   . Diabetes Paternal Grandmother   . Asthma Father   . Suicidality Father   . Alcoholism Father    Social History   Socioeconomic History  . Marital status: Divorced    Spouse name: Not on file  . Number of children: 1  . Years of education: Associates  . Highest education level: Not on file  Occupational History  . Occupation: retired    Fish farm manager: [employer]  Tobacco Use  . Smoking status: Former Smoker    Packs/day: 1.00    Years: 25.00    Pack years: 25.00    Types: Cigarettes    Quit date: 03/05/2000    Years since quitting: 19.5  . Smokeless tobacco: Never Used  Vaping Use  . Vaping Use: Never used  Substance and Sexual Activity  . Alcohol use: Yes    Comment: Rare  . Drug use: No    Frequency: 2.0 times per week    Types: Marijuana    Comment: previous use of marijuana for pain  . Sexual activity: Not on file  Other Topics Concern  . Not on file  Social History Narrative  Lives at home alone.   Right-handed.   Rare use of caffeine.   Social Determinants of Health   Financial Resource Strain:   . Difficulty of Paying Living Expenses: Not on file  Food Insecurity:   . Worried About Charity fundraiser in the Last Year: Not on file  . Ran Out of Food in the Last Year: Not on file  Transportation Needs:   . Lack of Transportation (Medical): Not on file  . Lack of Transportation (Non-Medical): Not on file  Physical Activity:   . Days of Exercise per Week: Not on file  . Minutes of Exercise per Session: Not on file  Stress:   . Feeling of Stress : Not on file  Social Connections:   . Frequency of Communication with Friends and Family: Not on file  . Frequency of Social Gatherings with Friends and Family: Not on file  . Attends  Religious Services: Not on file  . Active Member of Clubs or Organizations: Not on file  . Attends Archivist Meetings: Not on file  . Marital Status: Not on file   Past Surgical History:  Procedure Laterality Date  . ABDOMINAL HYSTERECTOMY  1975  . APPENDECTOMY  1972  . FOOT SURGERY     LEFT  . KYPHOPLASTY Bilateral 03/08/2014   Procedure: Thoracic nine Kyphoplasty;  Surgeon: Ashok Pall, MD;  Location: [facility];  Service: Neurosurgery;  Laterality: Bilateral;  Thoracic nine Kyphoplasty  . LIPOMA EXCISION     back  . TONSILLECTOMY    . WRIST GANGLION EXCISION     left   Past Medical History:  Diagnosis Date  . Abnormal LFTs   . Allergic rhinitis   . Anemia   . Anxiety   . Arthritis   . Chronic back pain   . Chronic headaches   . Chronic kidney disease, unspecified   . DDD (degenerative disc disease)   . GERD (gastroesophageal reflux disease)   . Headache   . Hypercalcemia   . Hyperkalemia   . Hyperlipidemia   . Hypertension    KIDNEY SPECIALIST TOOK OFF BENICAR  . IBS (irritable bowel syndrome)   . Lupus ([place])   . Mixed connective tissue disease ([place])   . Nausea   . Neuropathy   . Peptic ulcer disease   . Raynaud's disease   . Small kidney    left  . Vitamin D deficiency disease    BP (!) 170/77   Pulse (!) 55   Temp 98.6 F (37 C)   Ht 5\' 1"  (1.549 m)   Wt 96 lb (43.5 kg)   SpO2 96%   BMI 18.14 kg/m   Opioid Risk Score:   Fall Risk Score:  `1  Depression screen PHQ 2/9  Depression screen PHQ 2/9 09/21/2019  Decreased Interest 3  Down, Depressed, Hopeless 2  PHQ - 2 Score 5  Altered sleeping 2  Tired, decreased energy 3  Change in appetite 2  Feeling bad or failure about yourself  1  Trouble concentrating 1  Moving slowly or fidgety/restless 2  Suicidal thoughts 2  PHQ-9 Score 18  Difficult doing work/chores Extremely dIfficult    Review of Systems  Constitutional: Negative.   HENT: Negative.   Eyes: Negative.     Respiratory: Negative.   Gastrointestinal: Negative.   Endocrine: Negative.   Genitourinary: Negative.   Musculoskeletal: Positive for back pain and gait problem.       Spasms   Skin: Negative.  Allergic/Immunologic: Negative.   Neurological: Positive for tremors and weakness.  Psychiatric/Behavioral: The patient is nervous/anxious.   All other systems reviewed and are negative.      Objective:   Physical Exam Gen: no distress, normal appearing, cachectic HEENT: oral mucosa pink and moist, NCAT Cardio: Reg rate Chest: normal effort, normal rate of breathing Abd: soft, non-distended Ext: no edema Skin: intact Neuro: Alert and oriented, hypophonic voice Musculoskeletal: Tender to palpation over multiple compression fractures in lumbar and thoracic spine. Kyphotic posture Psych: pleasant, normal affect     Assessment & Plan:  Mrs. Koerber is a 75 year old woman who presents to establish care for back pain.  Lower back pain: -MRI reviewed with patient and her son and shows evidence of multiple compression fractures.  -TLSO ordered for additional support. Advised that this could result in weakened paraspinals but at this time it will be beneficial in providing increased support and allowing relaxation of her paraspinals to lessen spasm. -Heat packs to paraspinal muscles for relaxation -Diclofenac gel QID prn ordered as well as lidocaine patch for pain relief. -Advised that pain is way for body to let her know that something is wrong and to take it easy- try to be as active as possible, but if feeling pain take time to rest.   Osteoporosis: -Discussed medications that could help and their side effects. Advised son and patient to let me know if they would like to consider these. -Discussed the importance of weight bearing activities to preserve bone mass -Discussed the importance of eating foods rich in vitamin D and calcium and provided a hand out with some of these  foods.   All questions answered. RTC in 5 weeks to assess progress with above interventions.

## 2019-09-22 ENCOUNTER — Encounter: Payer: Self-pay | Admitting: Physical Medicine and Rehabilitation

## 2019-09-22 MED ORDER — DICLOFENAC SODIUM 1 % EX GEL: 2.0000 g | Freq: Four times a day (QID) | CUTANEOUS | 3 refills | Status: DC

## 2019-09-22 MED ORDER — LIDOCAINE 4 % EX PTCH: 1.0000 | MEDICATED_PATCH | Freq: Every day | CUTANEOUS | 3 refills | Status: AC | PRN

## 2019-10-10 ENCOUNTER — Encounter: Payer: PPO | Admitting: Neurology

## 2019-10-11 DIAGNOSIS — S22000A Wedge compression fracture of unspecified thoracic vertebra, initial encounter for closed fracture: Secondary | ICD-10-CM | POA: Diagnosis not present

## 2019-10-19 ENCOUNTER — Encounter: Payer: PPO | Admitting: Neurology

## 2019-10-20 DIAGNOSIS — R748 Abnormal levels of other serum enzymes: Secondary | ICD-10-CM | POA: Diagnosis not present

## 2019-10-20 DIAGNOSIS — I10 Essential (primary) hypertension: Secondary | ICD-10-CM | POA: Diagnosis not present

## 2019-10-20 DIAGNOSIS — E782 Mixed hyperlipidemia: Secondary | ICD-10-CM | POA: Diagnosis not present

## 2019-10-20 DIAGNOSIS — K5900 Constipation, unspecified: Secondary | ICD-10-CM | POA: Diagnosis not present

## 2019-10-20 DIAGNOSIS — N3281 Overactive bladder: Secondary | ICD-10-CM | POA: Diagnosis not present

## 2019-10-28 ENCOUNTER — Encounter: Payer: Self-pay | Admitting: Physical Medicine and Rehabilitation

## 2019-10-28 ENCOUNTER — Encounter: Payer: PPO | Attending: Physical Medicine and Rehabilitation | Admitting: Physical Medicine and Rehabilitation

## 2019-10-28 ENCOUNTER — Other Ambulatory Visit: Payer: Self-pay

## 2019-10-28 VITALS — BP 176/80 | HR 58 | Temp 98.1°F | Ht 61.0 in | Wt 96.0 lb

## 2019-10-28 DIAGNOSIS — S22000A Wedge compression fracture of unspecified thoracic vertebra, initial encounter for closed fracture: Secondary | ICD-10-CM

## 2019-10-28 DIAGNOSIS — G4701 Insomnia due to medical condition: Secondary | ICD-10-CM

## 2019-10-28 DIAGNOSIS — M8000XA Age-related osteoporosis with current pathological fracture, unspecified site, initial encounter for fracture: Secondary | ICD-10-CM | POA: Diagnosis not present

## 2019-10-28 MED ORDER — AMITRIPTYLINE HCL 10 MG PO TABS: 10.0000 mg | ORAL_TABLET | Freq: Every day | ORAL | 0 refills | Status: DC

## 2019-10-28 MED ORDER — GABAPENTIN 100 MG PO CAPS: 100.0000 mg | ORAL_CAPSULE | Freq: Three times a day (TID) | ORAL | 0 refills | Status: DC

## 2019-10-28 MED ORDER — DICLOFENAC SODIUM 1 % EX GEL: 2.0000 g | Freq: Four times a day (QID) | CUTANEOUS | 3 refills | Status: DC

## 2019-10-28 NOTE — Progress Notes (Signed)
Subjective:    Patient ID: Holly Jimenez, female    DOB: 10/17/1945, 74 y.o.   MRN: [id number]  HPI She had a fall in April and had severe lower back pain.   She got the back brace and it did not help.  Her caregiver accompanies her.   She is also having incontinence.   Her BP is 176/80  She is not able to sleep at night.  MRI thoracic and lumbar spine reviewed and shows:  This MRI of the thoracic spine without contrast shows the following: 1.   The spinal cord appears normal. 2.   Acute compression fractures of the T8, T11 and L2 vertebral bodies. 3.   Chronic compression fracture of the T9 vertebral body with prior evidence of vertebral body augmentation. 4.   Multilevel degenerative changes as detailed above that does not lead to nerve root compression or spinal stenosis.  Her walking has improved since this happens but she is still limited. She walks around the house.   She has a diagnosis of osteoporosis. She is not on any medications for osteoporosis but has a referral to rheumatology.   Her son accompanies her today. He lives in [place] and is not with her every day.   She wants to move when she has a good day.   She has been having some decreased appetite.   Pain Inventory Average Pain 10 Pain Right Now 8 My pain is sharp, stabbing and aching  In the last 24 hours, has pain interfered with the following? General activity 9 Relation with others 8 Enjoyment of life 9 What TIME of day is your pain at its worst? night Sleep (in general) Poor  Pain is worse with: walking, bending, standing and some activites Pain improves with: rest and medication Relief from Meds: 8  walk with assistance use a cane use a walker ability to climb steps?  no do you drive?  no Do you have any goals in this area?  yes  not employed: date last employed . retired I need assistance with the following:  dressing, bathing, meal prep and household duties  bladder  control problems weakness tremor trouble walking spasms anxiety  new  new    Family History  Problem Relation Age of Onset  . Ovarian cancer Sister   . Colon cancer Sister   . Heart disease Paternal Grandfather   . Heart disease Maternal Grandfather   . Kidney disease Brother   . Kidney disease Mother   . Hypertension Mother   . Kidney disease Maternal Grandmother   . Diabetes Paternal Grandmother   . Asthma Father   . Suicidality Father   . Alcoholism Father    Social History   Socioeconomic History  . Marital status: Divorced    Spouse name: Not on file  . Number of children: 1  . Years of education: Associates  . Highest education level: Not on file  Occupational History  . Occupation: retired    Fish farm manager: [employer]  Tobacco Use  . Smoking status: Former Smoker    Packs/day: 1.00    Years: 25.00    Pack years: 25.00    Types: Cigarettes    Quit date: 03/05/2000    Years since quitting: 19.6  . Smokeless tobacco: Never Used  Vaping Use  . Vaping Use: Never used  Substance and Sexual Activity  . Alcohol use: Yes    Comment: Rare  . Drug use: No    Frequency: 2.0 times  per week    Types: Marijuana    Comment: previous use of marijuana for pain  . Sexual activity: Not on file  Other Topics Concern  . Not on file  Social History Narrative   Lives at home alone.   Right-handed.   Rare use of caffeine.   Social Determinants of Health   Financial Resource Strain:   . Difficulty of Paying Living Expenses: Not on file  Food Insecurity:   . Worried About Charity fundraiser in the Last Year: Not on file  . Ran Out of Food in the Last Year: Not on file  Transportation Needs:   . Lack of Transportation (Medical): Not on file  . Lack of Transportation (Non-Medical): Not on file  Physical Activity:   . Days of Exercise per Week: Not on file  . Minutes of Exercise per Session: Not on file  Stress:   . Feeling of Stress : Not on file  Social  Connections:   . Frequency of Communication with Friends and Family: Not on file  . Frequency of Social Gatherings with Friends and Family: Not on file  . Attends Religious Services: Not on file  . Active Member of Clubs or Organizations: Not on file  . Attends Archivist Meetings: Not on file  . Marital Status: Not on file   Past Surgical History:  Procedure Laterality Date  . ABDOMINAL HYSTERECTOMY  1975  . APPENDECTOMY  1972  . FOOT SURGERY     LEFT  . KYPHOPLASTY Bilateral 03/08/2014   Procedure: Thoracic nine Kyphoplasty;  Surgeon: Ashok Pall, MD;  Location: [facility];  Service: Neurosurgery;  Laterality: Bilateral;  Thoracic nine Kyphoplasty  . LIPOMA EXCISION     back  . TONSILLECTOMY    . WRIST GANGLION EXCISION     left   Past Medical History:  Diagnosis Date  . Abnormal LFTs   . Allergic rhinitis   . Anemia   . Anxiety   . Arthritis   . Chronic back pain   . Chronic headaches   . Chronic kidney disease, unspecified   . DDD (degenerative disc disease)   . GERD (gastroesophageal reflux disease)   . Headache   . Hypercalcemia   . Hyperkalemia   . Hyperlipidemia   . Hypertension    KIDNEY SPECIALIST TOOK OFF BENICAR  . IBS (irritable bowel syndrome)   . Lupus ([place])   . Mixed connective tissue disease ([place])   . Nausea   . Neuropathy   . Peptic ulcer disease   . Raynaud's disease   . Small kidney    left  . Vitamin D deficiency disease    There were no vitals taken for this visit.  Opioid Risk Score:   Fall Risk Score:  `1  Depression screen PHQ 2/9  Depression screen PHQ 2/9 09/21/2019  Decreased Interest 3  Down, Depressed, Hopeless 2  PHQ - 2 Score 5  Altered sleeping 2  Tired, decreased energy 3  Change in appetite 2  Feeling bad or failure about yourself  1  Trouble concentrating 1  Moving slowly or fidgety/restless 2  Suicidal thoughts 2  PHQ-9 Score 18  Difficult doing work/chores Extremely dIfficult    Review of Systems    Constitutional: Negative.   HENT: Negative.   Eyes: Negative.   Respiratory: Negative.   Gastrointestinal: Negative.   Endocrine: Negative.   Genitourinary: Negative.   Musculoskeletal: Positive for back pain and gait problem.  Spasms   Skin: Negative.   Allergic/Immunologic: Negative.   Neurological: Positive for tremors and weakness.  Psychiatric/Behavioral: The patient is nervous/anxious.   All other systems reviewed and are negative.      Objective:   Physical Exam Gen: no distress, normal appearing HEENT: oral mucosa pink and moist, NCAT Cardio: Reg rate Chest: normal effort, normal rate of breathing Abd: soft, non-distended Ext: no edema Skin: intact Neuro: Alert and oriented, hypophonic voice Musculoskeletal: Tender to palpation over multiple compression fractures in lumbar and thoracic spine. Kyphotic posture Psych: pleasant, normal affect      Assessment & Plan:  Holly Jimenez is a 74 year old woman who presents to establish care for back pain.  Lower back pain: -MRI reviewed with patient and her son and shows evidence of multiple compression fractures.  -TLSO ordered for additional support but this did not help. Advised that this could result in weakened paraspinals but at this time it will be beneficial in providing increased support and allowing relaxation of her paraspinals to lessen spasm. -Heat packs to paraspinal muscles for relaxation -Diclofenac gel QID prn ordered as well as lidocaine patch for pain relief. -Advised that pain is way for body to let her know that something is wrong and to take it easy- try to be as active as possible, but if feeling pain take time to rest.  -Prescribed Gabapentin 100mg  TID and discussed how to uptitrate if well tolerated.   Osteoporosis: -Discussed medications that could help and their side effects. Advised son and patient to let me know if they would like to consider these. -Discussed the importance of  weight bearing activities to preserve bone mass -Discussed the importance of eating foods rich in vitamin D and calcium and provided a hand out with some of these foods.  -Trigger point injections next visit.   Insomnia: -Try to go outside near sunrise -Get exercise during the day.  -Discussed good sleep hygiene: turning off all devices an hour before bedtime.  -Prescribed Amitriptyline 10mg  HS. Advised that this may make her very groggy in the morning and to wear diapers at night as it could cause incontinence. She does already have diapers.   All questions answered. RTC in 5 weeks to assess progress with above interventions.

## 2019-11-02 DIAGNOSIS — N3281 Overactive bladder: Secondary | ICD-10-CM | POA: Diagnosis not present

## 2019-11-02 DIAGNOSIS — K5900 Constipation, unspecified: Secondary | ICD-10-CM | POA: Diagnosis not present

## 2019-11-02 DIAGNOSIS — I10 Essential (primary) hypertension: Secondary | ICD-10-CM | POA: Diagnosis not present

## 2019-11-02 DIAGNOSIS — E782 Mixed hyperlipidemia: Secondary | ICD-10-CM | POA: Diagnosis not present

## 2019-11-02 DIAGNOSIS — Z23 Encounter for immunization: Secondary | ICD-10-CM | POA: Diagnosis not present

## 2019-11-02 DIAGNOSIS — R748 Abnormal levels of other serum enzymes: Secondary | ICD-10-CM | POA: Diagnosis not present

## 2019-11-24 ENCOUNTER — Other Ambulatory Visit: Payer: Self-pay | Admitting: Physical Medicine and Rehabilitation

## 2019-11-29 ENCOUNTER — Encounter: Payer: Self-pay | Admitting: Physical Medicine and Rehabilitation

## 2019-11-29 ENCOUNTER — Encounter: Payer: PPO | Attending: Physical Medicine and Rehabilitation | Admitting: Physical Medicine and Rehabilitation

## 2019-11-29 ENCOUNTER — Other Ambulatory Visit: Payer: Self-pay

## 2019-11-29 VITALS — BP 152/71 | HR 72 | Temp 97.9°F | Ht 61.0 in | Wt 96.0 lb

## 2019-11-29 DIAGNOSIS — G588 Other specified mononeuropathies: Secondary | ICD-10-CM | POA: Diagnosis not present

## 2019-11-29 DIAGNOSIS — I10 Essential (primary) hypertension: Secondary | ICD-10-CM | POA: Diagnosis not present

## 2019-11-29 DIAGNOSIS — S22000A Wedge compression fracture of unspecified thoracic vertebra, initial encounter for closed fracture: Secondary | ICD-10-CM | POA: Diagnosis not present

## 2019-11-29 MED ORDER — GABAPENTIN 100 MG PO CAPS: 200.0000 mg | ORAL_CAPSULE | Freq: Three times a day (TID) | ORAL | 3 refills | Status: DC

## 2019-11-29 MED ORDER — AMLODIPINE BESYLATE 2.5 MG PO TABS: 2.5000 mg | ORAL_TABLET | Freq: Every day | ORAL | 1 refills | Status: DC

## 2019-11-29 NOTE — Patient Instructions (Addendum)
Blue emu oil   [email or web address]

## 2019-11-29 NOTE — Progress Notes (Signed)
Subjective:    Patient ID: Holly Jimenez, female    DOB: 06/17/1945, 74 y.o.   MRN: [id number]  HPI  Pain is better. The cream and Gabapentin help. She was resting a lot more and that has also helped. She is now getting in an out of bed on her own.   She had a fall a few weeks ago and had a bandlike radiation of pain under her right breast. This continues now.   When she has a BM she is sometimes does not make it to the bathroom on time. She sometimes feels constipated. Grape juice with fiber has helped.   She got the back brace and it did not help.  Her son accompanies her today  Her BP is elevated this visit, though not as elevated as last visit. She as been unable to follow with her PCP to get her blood pressure medication.   She is sleeping better at night- stopped amitriptyline as it was giving her crazy dreams.   MRI thoracic and lumbar spine reviewed and shows:  This MRI of the thoracic spine without contrast shows the following: 1.   The spinal cord appears normal. 2.   Acute compression fractures of the T8, T11 and L2 vertebral bodies. 3.   Chronic compression fracture of the T9 vertebral body with prior evidence of vertebral body augmentation. 4.   Multilevel degenerative changes as detailed above that does not lead to nerve root compression or spinal stenosis.  Her walking has improved since this happens but she is still limited. She walks around the house.   She has a diagnosis of osteoporosis. She is not on any medications for osteoporosis but has a referral to rheumatology.   Her son accompanies her today. He lives in [place] and is not with her every day.   She wants to move when she has a good day.   She has been having some decreased appetite.   Pain Inventory Average Pain 7 Pain Right Now 7 My pain is intermittent and aching  In the last 24 hours, has pain interfered with the following? General activity 6 Relation with others 4 Enjoyment  of life 4 What TIME of day is your pain at its worst? evening Sleep (in general) Fair  Pain is worse with: walking, bending and some activites Pain improves with: rest, medication and heat Relief from Meds: 4      Family History  Problem Relation Age of Onset  . Ovarian cancer Sister   . Colon cancer Sister   . Heart disease Paternal Grandfather   . Heart disease Maternal Grandfather   . Kidney disease Brother   . Kidney disease Mother   . Hypertension Mother   . Kidney disease Maternal Grandmother   . Diabetes Paternal Grandmother   . Asthma Father   . Suicidality Father   . Alcoholism Father    Social History   Socioeconomic History  . Marital status: Divorced    Spouse name: Not on file  . Number of children: 1  . Years of education: Associates  . Highest education level: Not on file  Occupational History  . Occupation: retired    Fish farm manager: [employer]  Tobacco Use  . Smoking status: Former Smoker    Packs/day: 1.00    Years: 25.00    Pack years: 25.00    Types: Cigarettes    Quit date: 03/05/2000    Years since quitting: 19.7  . Smokeless tobacco: Never Used  Vaping  Use  . Vaping Use: Never used  Substance and Sexual Activity  . Alcohol use: Yes    Comment: Rare  . Drug use: No    Frequency: 2.0 times per week    Types: Marijuana    Comment: previous use of marijuana for pain  . Sexual activity: Not on file  Other Topics Concern  . Not on file  Social History Narrative   Lives at home alone.   Right-handed.   Rare use of caffeine.   Social Determinants of Health   Financial Resource Strain:   . Difficulty of Paying Living Expenses: Not on file  Food Insecurity:   . Worried About Charity fundraiser in the Last Year: Not on file  . Ran Out of Food in the Last Year: Not on file  Transportation Needs:   . Lack of Transportation (Medical): Not on file  . Lack of Transportation (Non-Medical): Not on file  Physical Activity:   . Days of  Exercise per Week: Not on file  . Minutes of Exercise per Session: Not on file  Stress:   . Feeling of Stress : Not on file  Social Connections:   . Frequency of Communication with Friends and Family: Not on file  . Frequency of Social Gatherings with Friends and Family: Not on file  . Attends Religious Services: Not on file  . Active Member of Clubs or Organizations: Not on file  . Attends Archivist Meetings: Not on file  . Marital Status: Not on file   Past Surgical History:  Procedure Laterality Date  . ABDOMINAL HYSTERECTOMY  1975  . APPENDECTOMY  1972  . FOOT SURGERY     LEFT  . KYPHOPLASTY Bilateral 03/08/2014   Procedure: Thoracic nine Kyphoplasty;  Surgeon: Ashok Pall, MD;  Location: [facility];  Service: Neurosurgery;  Laterality: Bilateral;  Thoracic nine Kyphoplasty  . LIPOMA EXCISION     back  . TONSILLECTOMY    . WRIST GANGLION EXCISION     left   Past Medical History:  Diagnosis Date  . Abnormal LFTs   . Allergic rhinitis   . Anemia   . Anxiety   . Arthritis   . Chronic back pain   . Chronic headaches   . Chronic kidney disease, unspecified   . DDD (degenerative disc disease)   . GERD (gastroesophageal reflux disease)   . Headache   . Hypercalcemia   . Hyperkalemia   . Hyperlipidemia   . Hypertension    KIDNEY SPECIALIST TOOK OFF BENICAR  . IBS (irritable bowel syndrome)   . Lupus ([place])   . Mixed connective tissue disease ([place])   . Nausea   . Neuropathy   . Peptic ulcer disease   . Raynaud's disease   . Small kidney    left  . Vitamin D deficiency disease    BP (!) 152/71   Pulse 72   Temp 97.9 F (36.6 C)   Ht 5\' 1"  (1.549 m)   Wt 96 lb (43.5 kg)   SpO2 96%   BMI 18.14 kg/m   Opioid Risk Score:   Fall Risk Score:  `1  Depression screen PHQ 2/9  Depression screen [facility] 2/9 11/29/2019 10/28/2019 09/21/2019  Decreased Interest 0 0 3  Down, Depressed, Hopeless 0 0 2  PHQ - 2 Score 0 0 5  Altered sleeping - - 2  Tired,  decreased energy - - 3  Change in appetite - - 2  Feeling bad or  failure about yourself  - - 1  Trouble concentrating - - 1  Moving slowly or fidgety/restless - - 2  Suicidal thoughts - - 2  PHQ-9 Score - - 18  Difficult doing work/chores - - Extremely dIfficult    Review of Systems  Constitutional: Negative.   HENT: Negative.   Eyes: Negative.   Respiratory: Negative.   Gastrointestinal: Negative.   Endocrine: Negative.   Genitourinary: Negative.   Musculoskeletal: Positive for back pain and gait problem.       Spasms   Skin: Negative.   Allergic/Immunologic: Negative.   Neurological: Positive for tremors and weakness.  Psychiatric/Behavioral: The patient is nervous/anxious.   All other systems reviewed and are negative.      Objective:   Physical Exam Gen: no distress, normal appearing HEENT: oral mucosa pink and moist, NCAT Cardio: Reg rate Chest: normal effort, normal rate of breathing Abd: soft, non-distended Ext: no edema Skin: intact Neuro: Alert and oriented, hypophonic voice Musculoskeletal: Tender to palpation over multiple compression fractures in lumbar and thoracic spine. Kyphotic posture Psych: pleasant, normal affect      Assessment & Plan:  Holly Jimenez is a 74 year old woman who presents to establish care for back pain.  Lower back pain: -MRI reviewed with patient and her son and shows evidence of multiple compression fractures.  -TLSO ordered for additional support but this did not help. Advised that this could result in weakened paraspinals but at this time it will be beneficial in providing increased support and allowing relaxation of her paraspinals to lessen spasm. -Heat packs to paraspinal muscles for relaxation -Diclofenac gel QID prn ordered as well as lidocaine patch for pain relief. -Advised that pain is way for body to let her know that something is wrong and to take it easy- try to be as active as possible, but if feeling pain  take time to rest.  -Increase Gabapentin to 200mg  TID since well tolerated, helping, but pain persists.    Osteoporosis: -Discussed medications that could help and their side effects. Advised son and patient to let me know if they would like to consider these. -Discussed the importance of weight bearing activities to preserve bone mass -Discussed the importance of eating foods rich in vitamin D and calcium and provided a hand out with some of these foods.   Constipation: discussed high fiber diet, laxatives if needed.   Insomnia: -Try to go outside near sunrise -Get exercise during the day.  -Discussed good sleep hygiene: turning off all devices an hour before bedtime.  -Amitriptyline caused nightmares so she stopped  HTN: Prescribed amlodipine 2.5mg  daily. Check BP at home daily and bring to f/u appointment. Advised high potassium foods.  Intercostal neuralgia: -She has pain refractory to medication management that is severe and greatly affecting her quality of life. She and her son prefer procedural intervention to trial of more medications.  -Discussed Sprint PNS system as an option of pain treatment via neuromodulation. Provided following link for patient to learn more about the system: [email or web address]://www.spr[employer].com/. Will target right T6 intercostal nerve  All questions answered. RTC in January

## 2020-01-13 ENCOUNTER — Encounter: Payer: PPO | Admitting: Physical Medicine and Rehabilitation

## 2020-01-23 ENCOUNTER — Other Ambulatory Visit: Payer: Self-pay | Admitting: Physical Medicine and Rehabilitation

## 2020-01-26 ENCOUNTER — Other Ambulatory Visit: Payer: Self-pay | Admitting: Physical Medicine and Rehabilitation

## 2020-02-21 ENCOUNTER — Other Ambulatory Visit: Payer: Self-pay

## 2020-02-21 ENCOUNTER — Encounter: Payer: PPO | Attending: Physical Medicine and Rehabilitation | Admitting: Physical Medicine and Rehabilitation

## 2020-02-21 ENCOUNTER — Encounter: Payer: Self-pay | Admitting: Physical Medicine and Rehabilitation

## 2020-02-21 VITALS — BP 171/70 | HR 60 | Temp 98.6°F | Ht 61.0 in | Wt 96.0 lb

## 2020-02-21 DIAGNOSIS — G588 Other specified mononeuropathies: Secondary | ICD-10-CM | POA: Insufficient documentation

## 2020-02-21 NOTE — Progress Notes (Signed)
Procedure: Ultrasound-guided SPRINT PNS placement. Location: Thoracic Intercostal nerve Indication: Symptomatic intercostal neuralgia   Risks and benefits of procedure were discussed with the patient and consent was obtained.  Ultrasound guidance was used to scan the thoracic intercostal nerve. 58mL lidocaine injected to create skin wheal. Needle injected lateral to medial and needle tip was visualized 1cm from the thoracic intercostal nerve.  Care was taken to stay extraneural and away from the lung pleura. Stimulation resulted in parasethesias in the appropriate distribution. Pulse generator was placed.   Patient tolerated the procedure well and post-procedure instructions were given.

## 2020-03-07 NOTE — Telephone Encounter (Signed)
,,

## 2020-04-17 ENCOUNTER — Encounter: Payer: Self-pay | Admitting: Physical Medicine and Rehabilitation

## 2020-04-17 ENCOUNTER — Encounter: Payer: PPO | Attending: Physical Medicine and Rehabilitation | Admitting: Physical Medicine and Rehabilitation

## 2020-04-17 ENCOUNTER — Other Ambulatory Visit: Payer: Self-pay

## 2020-04-17 VITALS — BP 177/77 | HR 65 | Temp 98.7°F | Ht 61.0 in | Wt 97.0 lb

## 2020-04-17 DIAGNOSIS — M48062 Spinal stenosis, lumbar region with neurogenic claudication: Secondary | ICD-10-CM | POA: Insufficient documentation

## 2020-04-17 DIAGNOSIS — G588 Other specified mononeuropathies: Secondary | ICD-10-CM | POA: Insufficient documentation

## 2020-04-17 DIAGNOSIS — S22000A Wedge compression fracture of unspecified thoracic vertebra, initial encounter for closed fracture: Secondary | ICD-10-CM | POA: Insufficient documentation

## 2020-04-17 NOTE — Progress Notes (Signed)
Subjective:    Patient ID: Holly Jimenez, female    DOB: 1945/07/05, 75 y.o.   MRN: [id number]  HPI  [name] presents for follow-up of intercostal neuralgia:  1) Intercostal neuralgia: -Pain is better.  -The cream and Gabapentin did help.  -She was resting a lot more and that has also helped.  -She is now getting in an out of bed on her own.  -Pain has improved significantly with SPRINT PNS.   2) Lower back pain: -has been bothering her more recently.   She had a fall a few weeks ago and had a bandlike radiation of pain under her right breast. This continues now.   When she has a BM she is sometimes does not make it to the bathroom on time. She sometimes feels constipated. Grape juice with fiber has helped.   She got the back brace and it did not help.  Her son accompanies her today  Her BP is elevated this visit, though not as elevated as last visit. She as been unable to follow with her PCP to get her blood pressure medication.   She is sleeping better at night- stopped amitriptyline as it was giving her crazy dreams.   MRI thoracic and lumbar spine reviewed and shows:  This MRI of the thoracic spine without contrast shows the following: 1.   The spinal cord appears normal. 2.   Acute compression fractures of the T8, T11 and L2 vertebral bodies. 3.   Chronic compression fracture of the T9 vertebral body with prior evidence of vertebral body augmentation. 4.   Multilevel degenerative changes as detailed above that does not lead to nerve root compression or spinal stenosis.  Her walking has improved since this happens but she is still limited. She walks around the house.   She has a diagnosis of osteoporosis. She is not on any medications for osteoporosis but has a referral to rheumatology.   Her son accompanies her today. He lives in [place] and is not with her every day.   She wants to move when she has a good day.   She has been having some  decreased appetite.   Pain Inventory Average Pain 8 Pain Right Now 8 My pain is intermittent and aching  In the last 24 hours, has pain interfered with the following? General activity 6 Relation with others 4 Enjoyment of life 4 What TIME of day is your pain at its worst? evening Sleep (in general) Fair  Pain is worse with: walking, bending and some activites Pain improves with: rest, medication and heat Relief from Meds: 4      Family History  Problem Relation Age of Onset  . Ovarian cancer Sister   . Colon cancer Sister   . Heart disease Paternal Grandfather   . Heart disease Maternal Grandfather   . Kidney disease Brother   . Kidney disease Mother   . Hypertension Mother   . Kidney disease Maternal Grandmother   . Diabetes Paternal Grandmother   . Asthma Father   . Suicidality Father   . Alcoholism Father    Social History   Socioeconomic History  . Marital status: Divorced    Spouse name: Not on file  . Number of children: 1  . Years of education: Associates  . Highest education level: Not on file  Occupational History  . Occupation: retired    Fish farm manager: [employer]  Tobacco Use  . Smoking status: Former Smoker    Packs/day: 1.00  Years: 25.00    Pack years: 25.00    Types: Cigarettes    Quit date: 03/05/2000    Years since quitting: 20.1  . Smokeless tobacco: Never Used  Vaping Use  . Vaping Use: Never used  Substance and Sexual Activity  . Alcohol use: Yes    Comment: Rare  . Drug use: No    Frequency: 2.0 times per week    Types: Marijuana    Comment: previous use of marijuana for pain  . Sexual activity: Not on file  Other Topics Concern  . Not on file  Social History Narrative   Lives at home alone.   Right-handed.   Rare use of caffeine.   Social Determinants of Health   Financial Resource Strain: Not on file  Food Insecurity: Not on file  Transportation Needs: Not on file  Physical Activity: Not on file  Stress: Not  on file  Social Connections: Not on file   Past Surgical History:  Procedure Laterality Date  . ABDOMINAL HYSTERECTOMY  1975  . APPENDECTOMY  1972  . FOOT SURGERY     LEFT  . KYPHOPLASTY Bilateral 03/08/2014   Procedure: Thoracic nine Kyphoplasty;  Surgeon: Ashok Pall, MD;  Location: [facility];  Service: Neurosurgery;  Laterality: Bilateral;  Thoracic nine Kyphoplasty  . LIPOMA EXCISION     back  . TONSILLECTOMY    . WRIST GANGLION EXCISION     left   Past Medical History:  Diagnosis Date  . Abnormal LFTs   . Allergic rhinitis   . Anemia   . Anxiety   . Arthritis   . Chronic back pain   . Chronic headaches   . Chronic kidney disease, unspecified   . DDD (degenerative disc disease)   . GERD (gastroesophageal reflux disease)   . Headache   . Hypercalcemia   . Hyperkalemia   . Hyperlipidemia   . Hypertension    KIDNEY SPECIALIST TOOK OFF BENICAR  . IBS (irritable bowel syndrome)   . Lupus ([place])   . Mixed connective tissue disease ([place])   . Nausea   . Neuropathy   . Peptic ulcer disease   . Raynaud's disease   . Small kidney    left  . Vitamin D deficiency disease    There were no vitals taken for this visit.  Opioid Risk Score:   Fall Risk Score:  `1  Depression screen PHQ 2/9  Depression screen [facility] 2/9 11/29/2019 10/28/2019 09/21/2019  Decreased Interest 0 0 3  Down, Depressed, Hopeless 0 0 2  PHQ - 2 Score 0 0 5  Altered sleeping - - 2  Tired, decreased energy - - 3  Change in appetite - - 2  Feeling bad or failure about yourself  - - 1  Trouble concentrating - - 1  Moving slowly or fidgety/restless - - 2  Suicidal thoughts - - 2  PHQ-9 Score - - 18  Difficult doing work/chores - - Extremely dIfficult    Review of Systems  Constitutional: Negative.   HENT: Negative.   Eyes: Negative.   Respiratory: Negative.   Gastrointestinal: Negative.   Endocrine: Negative.   Genitourinary: Negative.   Musculoskeletal: Positive for back pain and gait  problem.       Spasms   Skin: Negative.   Allergic/Immunologic: Negative.   Neurological: Positive for tremors and weakness.  Psychiatric/Behavioral: The patient is nervous/anxious.   All other systems reviewed and are negative.      Objective:  Physical Exam Gen: no distress, normal appearing HEENT: oral mucosa pink and moist, NCAT Cardio: Reg rate Chest: normal effort, normal rate of breathing Abd: soft, non-distended Ext: no edema Psych: pleasant, normal affect Skin: intact Neuro: Alert and oriented, hypophonic voice Musculoskeletal: Tender to palpation over multiple compression fractures in lumbar and thoracic spine. Kyphotic posture -SPRINT PNS site c/d/i with small scab- no bleeding with removal of device.  Psych: pleasant, normal affect     Assessment & Plan:  Mrs. Akkerman is a 75 year old woman who presents to establish care for back pain.  Lower back pain: -MRI reviewed with patient and her son and shows evidence of multiple compression fractures.  -TLSO ordered for additional support but this did not help. Advised that this could result in weakened paraspinals but at this time it will be beneficial in providing increased support and allowing relaxation of her paraspinals to lessen spasm. -Heat packs to paraspinal muscles for relaxation -Diclofenac gel QID prn ordered as well as lidocaine patch for pain relief. -Advised that pain is way for body to let her know that something is wrong and to take it easy- try to be as active as possible, but if feeling pain take time to rest.  -Increase Gabapentin to 200mg  TID since well tolerated, helping, but pain persists.  -lidocaine patch prescribed.    Osteoporosis: -Discussed medications that could help and their side effects. Advised son and patient to let me know if they would like to consider these. -Discussed the importance of weight bearing activities to preserve bone mass -Discussed the importance of eating  foods rich in vitamin D and calcium and provided a hand out with some of these foods.   Constipation: discussed high fiber diet, laxatives if needed.   Insomnia: -Try to go outside near sunrise -Get exercise during the day.  -Discussed good sleep hygiene: turning off all devices an hour before bedtime.  -Amitriptyline caused nightmares so she stopped  HTN: Prescribed amlodipine 2.5mg  daily. Check BP at home daily and bring to f/u appointment. Advised high potassium foods.  Intercostal neuralgia: -sprint PNS removed without any complications -it has greatly helped! She currently has no complaints.   All questions answered. RTC in January

## 2020-04-18 ENCOUNTER — Encounter: Payer: Self-pay | Admitting: Physical Medicine and Rehabilitation

## 2020-05-18 DIAGNOSIS — G629 Polyneuropathy, unspecified: Secondary | ICD-10-CM | POA: Diagnosis not present

## 2020-05-18 DIAGNOSIS — K219 Gastro-esophageal reflux disease without esophagitis: Secondary | ICD-10-CM | POA: Diagnosis not present

## 2020-05-18 DIAGNOSIS — R350 Frequency of micturition: Secondary | ICD-10-CM | POA: Diagnosis not present

## 2020-05-18 DIAGNOSIS — R197 Diarrhea, unspecified: Secondary | ICD-10-CM | POA: Diagnosis not present

## 2020-05-18 DIAGNOSIS — R1012 Left upper quadrant pain: Secondary | ICD-10-CM | POA: Diagnosis not present

## 2020-05-18 DIAGNOSIS — M5136 Other intervertebral disc degeneration, lumbar region: Secondary | ICD-10-CM | POA: Diagnosis not present

## 2020-05-18 DIAGNOSIS — M351 Other overlap syndromes: Secondary | ICD-10-CM | POA: Diagnosis not present

## 2020-05-18 DIAGNOSIS — K582 Mixed irritable bowel syndrome: Secondary | ICD-10-CM | POA: Diagnosis not present

## 2020-05-18 DIAGNOSIS — R110 Nausea: Secondary | ICD-10-CM | POA: Diagnosis not present

## 2020-05-18 DIAGNOSIS — R634 Abnormal weight loss: Secondary | ICD-10-CM | POA: Diagnosis not present

## 2020-05-18 DIAGNOSIS — K580 Irritable bowel syndrome with diarrhea: Secondary | ICD-10-CM | POA: Diagnosis not present

## 2020-05-22 ENCOUNTER — Other Ambulatory Visit: Payer: Self-pay | Admitting: Family Medicine

## 2020-05-22 DIAGNOSIS — R634 Abnormal weight loss: Secondary | ICD-10-CM

## 2020-05-24 ENCOUNTER — Telehealth: Payer: Self-pay | Admitting: *Deleted

## 2020-05-24 DIAGNOSIS — G894 Chronic pain syndrome: Secondary | ICD-10-CM

## 2020-05-24 DIAGNOSIS — Z5181 Encounter for therapeutic drug level monitoring: Secondary | ICD-10-CM

## 2020-05-24 DIAGNOSIS — Z79891 Long term (current) use of opiate analgesic: Secondary | ICD-10-CM

## 2020-05-24 NOTE — Telephone Encounter (Signed)
Holly Jimenez called to request that Dr Ranell Patrick send her in some pain medication, hydrocodone was what she has had in the past that worked. She is having a lot of leg pain and in her lower back and side. Per the PMP she was last prescribed a narcotic in 09/23/2019 #120 hydrocodone 5-325. Appt with you 06/08/20.  Please advise.

## 2020-05-25 NOTE — Telephone Encounter (Signed)
Per Dr Ranell Patrick: "Please let her know we only prescribe controlled substances after pain contract is signed, urine sample is obtained and results are negative for other substances. I would be fine with her coming in any time to do this- please let her know it takes about 7 days for the results of the urine sample to return and we require monthly appointments (most would be with Zella Ball) in order to receive controlled substances, alternatively we can consider other options- for pain radiating down leg a higher dose of Gabapentin may be helpful for her, thank you! "  I have let her know. I have placed a contract and urine screen order up front with Judeen Hammans.

## 2020-05-26 ENCOUNTER — Other Ambulatory Visit: Payer: Self-pay

## 2020-05-26 ENCOUNTER — Ambulatory Visit
Admission: RE | Admit: 2020-05-26 | Discharge: 2020-05-26 | Disposition: A | Discharge status: Home / Self Care | Payer: PPO | Source: Ambulatory Visit | Attending: Family Medicine | Admitting: Family Medicine

## 2020-05-26 DIAGNOSIS — R634 Abnormal weight loss: Secondary | ICD-10-CM

## 2020-05-28 ENCOUNTER — Other Ambulatory Visit: Payer: Self-pay | Admitting: Family Medicine

## 2020-05-28 DIAGNOSIS — M795 Residual foreign body in soft tissue: Secondary | ICD-10-CM

## 2020-05-29 DIAGNOSIS — Z79891 Long term (current) use of opiate analgesic: Secondary | ICD-10-CM | POA: Diagnosis not present

## 2020-05-29 DIAGNOSIS — G894 Chronic pain syndrome: Secondary | ICD-10-CM | POA: Diagnosis not present

## 2020-05-29 DIAGNOSIS — Z5181 Encounter for therapeutic drug level monitoring: Secondary | ICD-10-CM | POA: Diagnosis not present

## 2020-06-01 ENCOUNTER — Other Ambulatory Visit: Payer: Self-pay

## 2020-06-01 ENCOUNTER — Ambulatory Visit
Admission: RE | Admit: 2020-06-01 | Discharge: 2020-06-01 | Disposition: A | Discharge status: Home / Self Care | Payer: PPO | Source: Ambulatory Visit | Attending: Family Medicine | Admitting: Family Medicine

## 2020-06-01 DIAGNOSIS — N281 Cyst of kidney, acquired: Secondary | ICD-10-CM | POA: Diagnosis not present

## 2020-06-01 DIAGNOSIS — M5137 Other intervertebral disc degeneration, lumbosacral region: Secondary | ICD-10-CM | POA: Diagnosis not present

## 2020-06-01 DIAGNOSIS — R634 Abnormal weight loss: Secondary | ICD-10-CM | POA: Diagnosis not present

## 2020-06-01 DIAGNOSIS — I878 Other specified disorders of veins: Secondary | ICD-10-CM | POA: Diagnosis not present

## 2020-06-01 DIAGNOSIS — K8689 Other specified diseases of pancreas: Secondary | ICD-10-CM | POA: Diagnosis not present

## 2020-06-01 DIAGNOSIS — K838 Other specified diseases of biliary tract: Secondary | ICD-10-CM | POA: Diagnosis not present

## 2020-06-01 DIAGNOSIS — M795 Residual foreign body in soft tissue: Secondary | ICD-10-CM

## 2020-06-01 DIAGNOSIS — M5136 Other intervertebral disc degeneration, lumbar region: Secondary | ICD-10-CM | POA: Diagnosis not present

## 2020-06-01 DIAGNOSIS — I700 Atherosclerosis of aorta: Secondary | ICD-10-CM | POA: Diagnosis not present

## 2020-06-06 LAB — TOXASSURE SELECT,+ANTIDEPR,UR

## 2020-06-08 ENCOUNTER — Ambulatory Visit: Payer: PPO | Admitting: Physical Medicine and Rehabilitation

## 2020-06-12 ENCOUNTER — Other Ambulatory Visit: Payer: Self-pay

## 2020-06-12 ENCOUNTER — Encounter: Payer: PPO | Attending: Physical Medicine and Rehabilitation | Admitting: Registered Nurse

## 2020-06-12 ENCOUNTER — Encounter: Payer: Self-pay | Admitting: Registered Nurse

## 2020-06-12 VITALS — BP 186/73 | HR 63 | Temp 97.8°F | Ht 61.0 in | Wt 104.8 lb

## 2020-06-12 DIAGNOSIS — M545 Low back pain, unspecified: Secondary | ICD-10-CM | POA: Diagnosis not present

## 2020-06-12 DIAGNOSIS — G8929 Other chronic pain: Secondary | ICD-10-CM | POA: Insufficient documentation

## 2020-06-12 DIAGNOSIS — G588 Other specified mononeuropathies: Secondary | ICD-10-CM | POA: Insufficient documentation

## 2020-06-12 DIAGNOSIS — G894 Chronic pain syndrome: Secondary | ICD-10-CM | POA: Diagnosis not present

## 2020-06-12 DIAGNOSIS — M48062 Spinal stenosis, lumbar region with neurogenic claudication: Secondary | ICD-10-CM | POA: Diagnosis not present

## 2020-06-12 MED ORDER — HYDROCODONE-ACETAMINOPHEN 5-325 MG PO TABS: 1.0000 | ORAL_TABLET | Freq: Every day | ORAL | 0 refills | Status: DC | PRN

## 2020-06-12 NOTE — Progress Notes (Signed)
Subjective:    Patient ID: Holly Jimenez, female    DOB: 08-25-1945, 75 y.o.   MRN: [id number]  HPI: Holly Jimenez is a 75 y.o. female who returns for follow up appointment for chronic pain. She  states her pain is located in her lower back, she also states Dr Ranell Patrick was going to prescribe her medication. This provider spoke with Dr Ranell Patrick, we will prescribe Hydrocodone 5 mg daily as needed prn, she verbalizes understanding.  She rates her pain 6. Her current exercise regime is walking.   Ms. Aleda Grana Morphine equivalent  will be 5.00 MME.She  is also prescribed alprazolam by Dr. Theda Sers. We have discussed the black box warning of using opioids and benzodiazepines. I highlighted the dangers of using these drugs together and discussed the adverse events including respiratory suppression, overdose, cognitive impairment and importance of compliance with current regimen. We will continue to monitor and adjust as indicated.   Last UDS was Performed on 05/29/2020, it was consistent.       Pain Inventory Average Pain 9 Pain Right Now 6 My pain is constant  In the last 24 hours, has pain interfered with the following? General activity 7 Relation with others 2 Enjoyment of life 2 What TIME of day is your pain at its worst? daytime Sleep (in general) Fair  Pain is worse with: walking, bending and some activites Pain improves with: rest, heat/ice and medication Relief from Meds:  not sure  Family History  Problem Relation Age of Onset   Ovarian cancer Sister    Colon cancer Sister    Heart disease Paternal Grandfather    Heart disease Maternal Grandfather    Kidney disease Brother    Kidney disease Mother    Hypertension Mother    Kidney disease Maternal Grandmother    Diabetes Paternal Grandmother    Asthma Father    Suicidality Father    Alcoholism Father    Social History   Socioeconomic History   Marital status: Divorced    Spouse name: Not on file    Number of children: 1   Years of education: Associates   Highest education level: Not on file  Occupational History   Occupation: retired    Fish farm manager: [employer]  Tobacco Use   Smoking status: Former Smoker    Packs/day: 1.00    Years: 25.00    Pack years: 25.00    Types: Cigarettes    Quit date: 03/05/2000    Years since quitting: 20.2   Smokeless tobacco: Never Used  Vaping Use   Vaping Use: Never used  Substance and Sexual Activity   Alcohol use: Yes    Comment: Rare   Drug use: No    Frequency: 2.0 times per week    Types: Marijuana    Comment: previous use of marijuana for pain   Sexual activity: Not on file  Other Topics Concern   Not on file  Social History Narrative   Lives at home alone.   Right-handed.   Rare use of caffeine.   Social Determinants of Health   Financial Resource Strain: Not on file  Food Insecurity: Not on file  Transportation Needs: Not on file  Physical Activity: Not on file  Stress: Not on file  Social Connections: Not on file   Past Surgical History:  Procedure Laterality Date   [place]     LEFT   KYPHOPLASTY Bilateral 03/08/2014  Procedure: Thoracic nine Kyphoplasty;  Surgeon: Ashok Pall, MD;  Location: [place] [facility];  Service: Neurosurgery;  Laterality: Bilateral;  Thoracic nine Kyphoplasty   LIPOMA EXCISION     back   TONSILLECTOMY     WRIST GANGLION EXCISION     left   Past Surgical History:  Procedure Laterality Date   ABDOMINAL HYSTERECTOMY  1975   APPENDECTOMY  1972   FOOT SURGERY     LEFT   KYPHOPLASTY Bilateral 03/08/2014   Procedure: Thoracic nine Kyphoplasty;  Surgeon: Ashok Pall, MD;  Location: [place] [facility];  Service: Neurosurgery;  Laterality: Bilateral;  Thoracic nine Kyphoplasty   LIPOMA EXCISION     back   TONSILLECTOMY     WRIST GANGLION EXCISION     left   Past Medical History:  Diagnosis Date   Abnormal LFTs    Allergic rhinitis     Anemia    Anxiety    Arthritis    Chronic back pain    Chronic headaches    Chronic kidney disease, unspecified    DDD (degenerative disc disease)    GERD (gastroesophageal reflux disease)    Headache    Hypercalcemia    Hyperkalemia    Hyperlipidemia    Hypertension    KIDNEY SPECIALIST TOOK OFF BENICAR   IBS (irritable bowel syndrome)    Lupus (HCC)    Mixed connective tissue disease (HCC)    Nausea    Neuropathy    Peptic ulcer disease    Raynaud's disease    Small kidney    left   Vitamin D deficiency disease    There were no vitals taken for this visit.  Opioid Risk Score:   Fall Risk Score:  `1  Depression screen PHQ 2/9  Depression screen [facility] 2/9 11/29/2019 10/28/2019 09/21/2019  Decreased Interest 0 0 3  Down, Depressed, Hopeless 0 0 2  PHQ - 2 Score 0 0 5  Altered sleeping - - 2  Tired, decreased energy - - 3  Change in appetite - - 2  Feeling bad or failure about yourself  - - 1  Trouble concentrating - - 1  Moving slowly or fidgety/restless - - 2  Suicidal thoughts - - 2  PHQ-9 Score - - 18  Difficult doing work/chores - - Extremely dIfficult    Review of Systems  Constitutional: Negative.   HENT: Negative.    Eyes: Negative.   Respiratory: Negative.    Cardiovascular: Negative.   Gastrointestinal: Negative.   Endocrine: Negative.   Genitourinary: Negative.   Musculoskeletal:  Positive for back pain.       Lower back pain  Skin: Negative.   Allergic/Immunologic: Negative.   Neurological: Negative.   Hematological: Negative.   Psychiatric/Behavioral: Negative.         Objective:   Physical Exam Vitals and nursing note reviewed.  Constitutional:      Appearance: Normal appearance.  Cardiovascular:     Rate and Rhythm: Normal rate and regular rhythm.     Pulses: Normal pulses.     Heart sounds: Normal heart sounds.  Pulmonary:     Effort: Pulmonary effort is normal.     Breath sounds: Normal breath sounds.  Musculoskeletal:      Cervical back: Normal range of motion and neck supple.     Comments: Normal Muscle Bulk and Muscle Testing Reveals:  Upper Extremities: Decreased ROM 90 Degrees  and Muscle Strength 5/5 Lumbar Paraspinal Tenderness: L-3-L-5 Lower Extremities: Full ROM and Muscle Strength  5/5 Arises from Table slowly Antalgic  Gait     Skin:    General: Skin is warm and dry.  Neurological:     Mental Status: She is alert and oriented to person, place, and time.  Psychiatric:        Mood and Affect: Mood normal.        Behavior: Behavior normal.         Assessment & Plan:  Lumbar Spinal Stenosis: Continue HEP as Tolerated . Continue to monitor.  Chronic Low Back Pain without sciatica: Continue HEP as Tolerated. Continue current medication regimen.  Intercostal Neuralgia: Continue Lyrica. Continue to Monitor.  Chronic Pain Syndrome: RX: Hydrocodone 5mg /325 one tablet daily as needed for pain #30.  We will continue the opioid monitoring program, this consists of regular clinic visits, examinations, urine drug screen, pill counts as well as use of [place] Controlled Substance Reporting system. A 12 month History has been reviewed on the [place] Controlled Substance Reporting System on 06/12/2020.   F/U in 1 month

## 2020-06-12 NOTE — Patient Instructions (Signed)
Call or send a My Chart Message in two weeks  Regarding medication change.

## 2020-06-14 ENCOUNTER — Telehealth: Payer: Self-pay | Admitting: *Deleted

## 2020-06-14 NOTE — Telephone Encounter (Signed)
Urine drug screen for this encounter is consistent for no prescribed or unprescribed controlled medication.

## 2020-06-21 DIAGNOSIS — M5136 Other intervertebral disc degeneration, lumbar region: Secondary | ICD-10-CM | POA: Diagnosis not present

## 2020-06-21 DIAGNOSIS — R946 Abnormal results of thyroid function studies: Secondary | ICD-10-CM | POA: Diagnosis not present

## 2020-06-21 DIAGNOSIS — N3281 Overactive bladder: Secondary | ICD-10-CM | POA: Diagnosis not present

## 2020-06-21 DIAGNOSIS — Z79899 Other long term (current) drug therapy: Secondary | ICD-10-CM | POA: Diagnosis not present

## 2020-06-21 DIAGNOSIS — I10 Essential (primary) hypertension: Secondary | ICD-10-CM | POA: Diagnosis not present

## 2020-06-21 DIAGNOSIS — R7309 Other abnormal glucose: Secondary | ICD-10-CM | POA: Diagnosis not present

## 2020-06-21 DIAGNOSIS — E559 Vitamin D deficiency, unspecified: Secondary | ICD-10-CM | POA: Diagnosis not present

## 2020-06-21 DIAGNOSIS — E782 Mixed hyperlipidemia: Secondary | ICD-10-CM | POA: Diagnosis not present

## 2020-06-21 DIAGNOSIS — K582 Mixed irritable bowel syndrome: Secondary | ICD-10-CM | POA: Diagnosis not present

## 2020-06-21 DIAGNOSIS — M810 Age-related osteoporosis without current pathological fracture: Secondary | ICD-10-CM | POA: Diagnosis not present

## 2020-06-21 DIAGNOSIS — G629 Polyneuropathy, unspecified: Secondary | ICD-10-CM | POA: Diagnosis not present

## 2020-06-27 DIAGNOSIS — N3281 Overactive bladder: Secondary | ICD-10-CM | POA: Diagnosis not present

## 2020-06-27 DIAGNOSIS — R946 Abnormal results of thyroid function studies: Secondary | ICD-10-CM | POA: Diagnosis not present

## 2020-06-27 DIAGNOSIS — M810 Age-related osteoporosis without current pathological fracture: Secondary | ICD-10-CM | POA: Diagnosis not present

## 2020-06-27 DIAGNOSIS — E782 Mixed hyperlipidemia: Secondary | ICD-10-CM | POA: Diagnosis not present

## 2020-06-27 DIAGNOSIS — E559 Vitamin D deficiency, unspecified: Secondary | ICD-10-CM | POA: Diagnosis not present

## 2020-06-27 DIAGNOSIS — I10 Essential (primary) hypertension: Secondary | ICD-10-CM | POA: Diagnosis not present

## 2020-06-27 DIAGNOSIS — R7309 Other abnormal glucose: Secondary | ICD-10-CM | POA: Diagnosis not present

## 2020-07-04 DIAGNOSIS — K219 Gastro-esophageal reflux disease without esophagitis: Secondary | ICD-10-CM | POA: Diagnosis not present

## 2020-07-04 DIAGNOSIS — I129 Hypertensive chronic kidney disease with stage 1 through stage 4 chronic kidney disease, or unspecified chronic kidney disease: Secondary | ICD-10-CM | POA: Diagnosis not present

## 2020-07-04 DIAGNOSIS — N1832 Chronic kidney disease, stage 3b: Secondary | ICD-10-CM | POA: Diagnosis not present

## 2020-07-04 DIAGNOSIS — M5136 Other intervertebral disc degeneration, lumbar region: Secondary | ICD-10-CM | POA: Diagnosis not present

## 2020-07-04 DIAGNOSIS — Z79899 Other long term (current) drug therapy: Secondary | ICD-10-CM | POA: Diagnosis not present

## 2020-07-04 DIAGNOSIS — Z8781 Personal history of (healed) traumatic fracture: Secondary | ICD-10-CM | POA: Diagnosis not present

## 2020-07-04 DIAGNOSIS — Z0000 Encounter for general adult medical examination without abnormal findings: Secondary | ICD-10-CM | POA: Diagnosis not present

## 2020-07-04 DIAGNOSIS — M351 Other overlap syndromes: Secondary | ICD-10-CM | POA: Diagnosis not present

## 2020-07-04 DIAGNOSIS — Z862 Personal history of diseases of the blood and blood-forming organs and certain disorders involving the immune mechanism: Secondary | ICD-10-CM | POA: Diagnosis not present

## 2020-07-04 DIAGNOSIS — R7303 Prediabetes: Secondary | ICD-10-CM | POA: Diagnosis not present

## 2020-07-10 ENCOUNTER — Other Ambulatory Visit: Payer: Self-pay

## 2020-07-10 ENCOUNTER — Encounter: Payer: Self-pay | Admitting: Registered Nurse

## 2020-07-10 ENCOUNTER — Encounter: Payer: PPO | Attending: Physical Medicine and Rehabilitation | Admitting: Registered Nurse

## 2020-07-10 VITALS — BP 149/79 | HR 70 | Temp 99.3°F | Ht 61.0 in | Wt 108.6 lb

## 2020-07-10 DIAGNOSIS — G894 Chronic pain syndrome: Secondary | ICD-10-CM | POA: Diagnosis not present

## 2020-07-10 DIAGNOSIS — M545 Low back pain, unspecified: Secondary | ICD-10-CM | POA: Insufficient documentation

## 2020-07-10 DIAGNOSIS — Z79891 Long term (current) use of opiate analgesic: Secondary | ICD-10-CM | POA: Diagnosis not present

## 2020-07-10 DIAGNOSIS — M48062 Spinal stenosis, lumbar region with neurogenic claudication: Secondary | ICD-10-CM | POA: Insufficient documentation

## 2020-07-10 DIAGNOSIS — G8929 Other chronic pain: Secondary | ICD-10-CM | POA: Insufficient documentation

## 2020-07-10 DIAGNOSIS — Z5181 Encounter for therapeutic drug level monitoring: Secondary | ICD-10-CM | POA: Diagnosis not present

## 2020-07-10 MED ORDER — HYDROCODONE-ACETAMINOPHEN 5-325 MG PO TABS: 1.0000 | ORAL_TABLET | Freq: Every day | ORAL | 0 refills | Status: DC | PRN

## 2020-07-10 NOTE — Progress Notes (Signed)
Subjective:    Patient ID: Holly Jimenez, female    DOB: 08-Jun-1945, 75 y.o.   MRN: [id number]  HPI: Holly Jimenez is a 75 y.o. female who returns for follow up appointment for chronic pain and medication refill. She states her pain is located in her lower back. She rates her pain 6. Her current exercise regime is walking and performing stretching exercises.  [name] Morphine equivalent is 5.00 MME.  She is also prescribed Alprazolam  by Dr. Theda Sers .We have discussed the black box warning of using opioids and benzodiazepines. I highlighted the dangers of using these drugs together and discussed the adverse events including respiratory suppression, overdose, cognitive impairment and importance of compliance with current regimen. We will continue to monitor and adjust as indicated.   Oral Swab was Performed Today.      Pain Inventory Average Pain 9 Pain Right Now 6 My pain is intermittent, sharp, stabbing, and aching  In the last 24 hours, has pain interfered with the following? General activity 8 Relation with others 8 Enjoyment of life 9 What TIME of day is your pain at its worst? daytime and evening Sleep (in general) Fair  Pain is worse with: walking, bending, standing, and some activites Pain improves with: rest, heat/ice, and medication Relief from Meds: 7  Family History  Problem Relation Age of Onset   Ovarian cancer Sister    Colon cancer Sister    Heart disease Paternal Grandfather    Heart disease Maternal Grandfather    Kidney disease Brother    Kidney disease Mother    Hypertension Mother    Kidney disease Maternal Grandmother    Diabetes Paternal Grandmother    Asthma Father    Suicidality Father    Alcoholism Father    Social History   Socioeconomic History   Marital status: Divorced    Spouse name: Not on file   Number of children: 1   Years of education: Associates   Highest education level: Not on file  Occupational  History   Occupation: retired    Fish farm manager: [employer]  Tobacco Use   Smoking status: Former    Packs/day: 1.00    Years: 25.00    Pack years: 25.00    Types: Cigarettes    Quit date: 03/05/2000    Years since quitting: 20.3   Smokeless tobacco: Never  Vaping Use   Vaping Use: Never used  Substance and Sexual Activity   Alcohol use: Yes    Comment: Rare   Drug use: No    Frequency: 2.0 times per week    Types: Marijuana    Comment: previous use of marijuana for pain   Sexual activity: Not on file  Other Topics Concern   Not on file  Social History Narrative   Lives at home alone.   Right-handed.   Rare use of caffeine.   Social Determinants of Health   Financial Resource Strain: Not on file  Food Insecurity: Not on file  Transportation Needs: Not on file  Physical Activity: Not on file  Stress: Not on file  Social Connections: Not on file   Past Surgical History:  Procedure Laterality Date   [place]   KYPHOPLASTY Bilateral 03/08/2014   Procedure: Thoracic nine Kyphoplasty;  Surgeon: Ashok Pall, MD;  Location: [place] [facility];  Service: Neurosurgery;  Laterality: Bilateral;  Thoracic nine Kyphoplasty  LIPOMA EXCISION     back   TONSILLECTOMY     WRIST GANGLION EXCISION     left   Past Surgical History:  Procedure Laterality Date   ABDOMINAL HYSTERECTOMY  1975   APPENDECTOMY  1972   FOOT SURGERY     LEFT   KYPHOPLASTY Bilateral 03/08/2014   Procedure: Thoracic nine Kyphoplasty;  Surgeon: Ashok Pall, MD;  Location: [place] [facility];  Service: Neurosurgery;  Laterality: Bilateral;  Thoracic nine Kyphoplasty   LIPOMA EXCISION     back   TONSILLECTOMY     WRIST GANGLION EXCISION     left   Past Medical History:  Diagnosis Date   Abnormal LFTs    Allergic rhinitis    Anemia    Anxiety    Arthritis    Chronic back pain    Chronic headaches    Chronic kidney disease, unspecified     DDD (degenerative disc disease)    GERD (gastroesophageal reflux disease)    Headache    Hypercalcemia    Hyperkalemia    Hyperlipidemia    Hypertension    KIDNEY SPECIALIST TOOK OFF BENICAR   IBS (irritable bowel syndrome)    Lupus (HCC)    Mixed connective tissue disease (HCC)    Nausea    Neuropathy    Peptic ulcer disease    Raynaud's disease    Small kidney    left   Vitamin D deficiency disease    BP (!) 149/79   Pulse 70   Temp 99.3 F (37.4 C)   Ht 5\' 1"  (1.549 m)   Wt 108 lb 9.6 oz (49.3 kg)   SpO2 96%   BMI 20.52 kg/m   Opioid Risk Score:   Fall Risk Score:  `1  Depression screen PHQ 2/9  Depression screen [facility] 2/9 11/29/2019 10/28/2019 09/21/2019  Decreased Interest 0 0 3  Down, Depressed, Hopeless 0 0 2  PHQ - 2 Score 0 0 5  Altered sleeping - - 2  Tired, decreased energy - - 3  Change in appetite - - 2  Feeling bad or failure about yourself  - - 1  Trouble concentrating - - 1  Moving slowly or fidgety/restless - - 2  Suicidal thoughts - - 2  PHQ-9 Score - - 18  Difficult doing work/chores - - Extremely dIfficult    Review of Systems  Gastrointestinal:  Positive for abdominal pain.       Left abdomen pain  Musculoskeletal:  Positive for back pain.  All other systems reviewed and are negative.     Objective:   Physical Exam Vitals and nursing note reviewed.  Constitutional:      Appearance: Normal appearance.  Cardiovascular:     Rate and Rhythm: Normal rate and regular rhythm.     Pulses: Normal pulses.     Heart sounds: Normal heart sounds.  Pulmonary:     Effort: Pulmonary effort is normal.     Breath sounds: Normal breath sounds.  Musculoskeletal:     Cervical back: Normal range of motion and neck supple.     Comments: Normal Muscle Bulk and Muscle Testing Reveals:  Upper Extremities: Full ROM and Muscle Strength 5/5 Lumbar Paraspinal Tenderness: L-4-L-5 Lower Extremities: Full ROM and Muscle Strength 5/5 Arises from Table  slowly Narrow Based  Gait     Skin:    General: Skin is warm and dry.  Neurological:     Mental Status: She is alert and oriented to person, place,  and time.  Psychiatric:        Mood and Affect: Mood normal.        Behavior: Behavior normal.         Assessment & Plan:  Lumbar Spinal Stenosis: Continue HEP as Tolerated . Continue to monitor. 07/10/2020 Chronic Low Back Pain without sciatica: Continue HEP as Tolerated. Continue current medication regimen. 07/10/2020 Intercostal Neuralgia: Continue Lyrica. Continue to Monitor. Chronic Pain Syndrome: Refilled: Hydrocodone 5mg /325 one tablet daily as needed for pain #30.  We will continue the opioid monitoring program, this consists of regular clinic visits, examinations, urine drug screen, pill counts as well as use of [place] Controlled Substance Reporting system. A 12 month History has been reviewed on the [place] Controlled Substance Reporting System on 07/10/2020.   F/U in 1 month

## 2020-07-12 DIAGNOSIS — M47816 Spondylosis without myelopathy or radiculopathy, lumbar region: Secondary | ICD-10-CM | POA: Diagnosis not present

## 2020-07-17 LAB — DRUG TOX ALC METAB W/CON, ORAL FLD: Alcohol Metabolite: NEGATIVE ng/mL (ref <25)

## 2020-07-20 DIAGNOSIS — M47816 Spondylosis without myelopathy or radiculopathy, lumbar region: Secondary | ICD-10-CM | POA: Diagnosis not present

## 2020-08-10 ENCOUNTER — Encounter: Payer: PPO | Attending: Physical Medicine and Rehabilitation | Admitting: Registered Nurse

## 2020-08-10 ENCOUNTER — Encounter: Payer: Self-pay | Admitting: Registered Nurse

## 2020-08-10 ENCOUNTER — Other Ambulatory Visit: Payer: Self-pay

## 2020-08-10 VITALS — BP 131/64 | HR 71 | Temp 99.0°F | Ht 61.0 in

## 2020-08-10 DIAGNOSIS — M545 Low back pain, unspecified: Secondary | ICD-10-CM | POA: Diagnosis not present

## 2020-08-10 DIAGNOSIS — G894 Chronic pain syndrome: Secondary | ICD-10-CM | POA: Insufficient documentation

## 2020-08-10 DIAGNOSIS — G8929 Other chronic pain: Secondary | ICD-10-CM | POA: Insufficient documentation

## 2020-08-10 DIAGNOSIS — M48062 Spinal stenosis, lumbar region with neurogenic claudication: Secondary | ICD-10-CM | POA: Insufficient documentation

## 2020-08-10 DIAGNOSIS — Z5181 Encounter for therapeutic drug level monitoring: Secondary | ICD-10-CM | POA: Insufficient documentation

## 2020-08-10 DIAGNOSIS — Z79899 Other long term (current) drug therapy: Secondary | ICD-10-CM | POA: Insufficient documentation

## 2020-08-10 MED ORDER — HYDROCODONE-ACETAMINOPHEN 5-325 MG PO TABS: 1.0000 | ORAL_TABLET | Freq: Every day | ORAL | 0 refills | Status: DC | PRN

## 2020-08-10 NOTE — Progress Notes (Signed)
Subjective:    Patient ID: Holly Jimenez, female    DOB: 1945-11-04, 75 y.o.   MRN: [id number]  HPI: Holly Jimenez is a 75 y.o. female who returns for follow up appointment for chronic pain and medication refill. She states her pain is located in her lower back. She rates her pain 2. Her current exercise regime is walking and performing stretching exercises.  [name] states she drove to the office today, she reports her personal care aide left her home, while she was getting dressed. Also states she call her [facility] regarding the above, they are looking into her complaint, emotional support given. She was instructed to call the office if this occurs again, she verbalizes understanding.     [name] Morphine equivalent is 5.00 MME. She  is also prescribed Alprazolam by Dr. Theda Sers .We have discussed the black box warning of using opioids and benzodiazepines. I highlighted the dangers of using these drugs together and discussed the adverse events including respiratory suppression, overdose, cognitive impairment and importance of compliance with current regimen. We will continue to monitor and adjust as indicated.    Pain Inventory Average Pain 10 Pain Right Now 2 My pain is intermittent, sharp, stabbing, and aching  In the last 24 hours, has pain interfered with the following? General activity 8 Relation with others 3 Enjoyment of life 5 What TIME of day is your pain at its worst? morning  and evening Sleep (in general) Fair  Pain is worse with: some activites Pain improves with: rest, medication, and injections Relief from Meds: 7  Family History  Problem Relation Age of Onset   Ovarian cancer Sister    Colon cancer Sister    Heart disease Paternal Grandfather    Heart disease Maternal Grandfather    Kidney disease Brother    Kidney disease Mother    Hypertension Mother    Kidney disease Maternal Grandmother    Diabetes  Paternal Grandmother    Asthma Father    Suicidality Father    Alcoholism Father    Social History   Socioeconomic History   Marital status: Divorced    Spouse name: Not on file   Number of children: 1   Years of education: Associates   Highest education level: Not on file  Occupational History   Occupation: retired    Fish farm manager: [employer]  Tobacco Use   Smoking status: Former    Packs/day: 1.00    Years: 25.00    Pack years: 25.00    Types: Cigarettes    Quit date: 03/05/2000    Years since quitting: 20.4   Smokeless tobacco: Never  Vaping Use   Vaping Use: Never used  Substance and Sexual Activity   Alcohol use: Yes    Comment: Rare   Drug use: No    Frequency: 2.0 times per week    Types: Marijuana    Comment: previous use of marijuana for pain   Sexual activity: Not on file  Other Topics Concern   Not on file  Social History Narrative   Lives at home alone.   Right-handed.   Rare use of caffeine.   Social Determinants of Health   Financial Resource Strain: Not on file  Food Insecurity: Not on file  Transportation Needs: Not on file  Physical Activity: Not on file  Stress: Not on file  Social Connections: Not on file   Past Surgical History:  Procedure Laterality Date   ABDOMINAL HYSTERECTOMY  [place]   FOOT SURGERY     LEFT   KYPHOPLASTY Bilateral 03/08/2014   Procedure: Thoracic nine Kyphoplasty;  Surgeon: Ashok Pall, MD;  Location: [place] [facility];  Service: Neurosurgery;  Laterality: Bilateral;  Thoracic nine Kyphoplasty   LIPOMA EXCISION     back   TONSILLECTOMY     WRIST GANGLION EXCISION     left   Past Surgical History:  Procedure Laterality Date   ABDOMINAL HYSTERECTOMY  1975   APPENDECTOMY  1972   FOOT SURGERY     LEFT   KYPHOPLASTY Bilateral 03/08/2014   Procedure: Thoracic nine Kyphoplasty;  Surgeon: Ashok Pall, MD;  Location: [place] [facility];  Service: Neurosurgery;  Laterality: Bilateral;  Thoracic nine  Kyphoplasty   LIPOMA EXCISION     back   TONSILLECTOMY     WRIST GANGLION EXCISION     left   Past Medical History:  Diagnosis Date   Abnormal LFTs    Allergic rhinitis    Anemia    Anxiety    Arthritis    Chronic back pain    Chronic headaches    Chronic kidney disease, unspecified    DDD (degenerative disc disease)    GERD (gastroesophageal reflux disease)    Headache    Hypercalcemia    Hyperkalemia    Hyperlipidemia    Hypertension    KIDNEY SPECIALIST TOOK OFF BENICAR   IBS (irritable bowel syndrome)    Lupus (HCC)    Mixed connective tissue disease (HCC)    Nausea    Neuropathy    Peptic ulcer disease    Raynaud's disease    Small kidney    left   Vitamin D deficiency disease    BP 131/64   Pulse 71   Temp 99 F (37.2 C)   Ht 5\' 1"  (1.549 m)   SpO2 97%   BMI 20.52 kg/m   Opioid Risk Score:   Fall Risk Score:  `1  Depression screen PHQ 2/9  Depression screen [facility] 2/9 07/10/2020 11/29/2019 10/28/2019 09/21/2019  Decreased Interest 0 0 0 3  Down, Depressed, Hopeless 0 0 0 2  PHQ - 2 Score 0 0 0 5  Altered sleeping - - - 2  Tired, decreased energy - - - 3  Change in appetite - - - 2  Feeling bad or failure about yourself  - - - 1  Trouble concentrating - - - 1  Moving slowly or fidgety/restless - - - 2  Suicidal thoughts - - - 2  PHQ-9 Score - - - 18  Difficult doing work/chores - - - Extremely dIfficult    Review of Systems  Gastrointestinal:  Positive for abdominal pain.  Musculoskeletal:  Positive for back pain and gait problem.  All other systems reviewed and are negative.     Objective:   Physical Exam Vitals and nursing note reviewed.  Constitutional:      Appearance: Normal appearance.  Cardiovascular:     Rate and Rhythm: Normal rate and regular rhythm.  Musculoskeletal:     Cervical back: Normal range of motion and neck supple.     Comments: Normal Muscle Bulk and Muscle Testing Reveals:  Upper Extremities: Full ROM and Muscle  Strength 5/5 Lower Extremities: Full ROM and Muscle Strength 5/5 Arises from the chair with ease Narrow Based  Gait     Skin:    General: Skin is warm and dry.  Neurological:     Mental Status: She  is alert and oriented to person, place, and time.  Psychiatric:        Mood and Affect: Mood normal.        Behavior: Behavior normal.         Assessment & Plan:  Lumbar Spinal Stenosis: Continue HEP as Tolerated . Continue to monitor. 08/10/2020 Chronic Low Back Pain without sciatica: Continue HEP as Tolerated. Continue current medication regimen. 08/10/2020 Intercostal Neuralgia: No complaints today. Continue Lyrica. Continue to Monitor.08/10/2020 Chronic Pain Syndrome: Refilled: Hydrocodone 5mg /325 one tablet daily as needed for pain #30.  We will continue the opioid monitoring program, this consists of regular clinic visits, examinations, urine drug screen, pill counts as well as use of [place] Controlled Substance Reporting system. A 12 month History has been reviewed on the [place] Controlled Substance Reporting System on 08/10/2020. F/U in 1 month

## 2020-09-05 DIAGNOSIS — N1832 Chronic kidney disease, stage 3b: Secondary | ICD-10-CM | POA: Diagnosis not present

## 2020-09-05 DIAGNOSIS — M1990 Unspecified osteoarthritis, unspecified site: Secondary | ICD-10-CM | POA: Diagnosis not present

## 2020-09-05 DIAGNOSIS — I129 Hypertensive chronic kidney disease with stage 1 through stage 4 chronic kidney disease, or unspecified chronic kidney disease: Secondary | ICD-10-CM | POA: Diagnosis not present

## 2020-09-05 DIAGNOSIS — M810 Age-related osteoporosis without current pathological fracture: Secondary | ICD-10-CM | POA: Diagnosis not present

## 2020-09-11 ENCOUNTER — Encounter: Payer: PPO | Attending: Physical Medicine and Rehabilitation | Admitting: Registered Nurse

## 2020-09-11 ENCOUNTER — Other Ambulatory Visit: Payer: Self-pay

## 2020-09-11 ENCOUNTER — Encounter: Payer: Self-pay | Admitting: Registered Nurse

## 2020-09-11 VITALS — BP 97/61 | HR 101 | Temp 98.3°F | Ht 61.0 in | Wt 112.0 lb

## 2020-09-11 DIAGNOSIS — M545 Low back pain, unspecified: Secondary | ICD-10-CM | POA: Insufficient documentation

## 2020-09-11 DIAGNOSIS — Z5181 Encounter for therapeutic drug level monitoring: Secondary | ICD-10-CM | POA: Insufficient documentation

## 2020-09-11 DIAGNOSIS — Z79899 Other long term (current) drug therapy: Secondary | ICD-10-CM | POA: Diagnosis not present

## 2020-09-11 DIAGNOSIS — M48062 Spinal stenosis, lumbar region with neurogenic claudication: Secondary | ICD-10-CM | POA: Insufficient documentation

## 2020-09-11 DIAGNOSIS — G894 Chronic pain syndrome: Secondary | ICD-10-CM | POA: Diagnosis not present

## 2020-09-11 DIAGNOSIS — G8929 Other chronic pain: Secondary | ICD-10-CM | POA: Diagnosis not present

## 2020-09-11 MED ORDER — HYDROCODONE-ACETAMINOPHEN 5-325 MG PO TABS: 1.0000 | ORAL_TABLET | Freq: Every day | ORAL | 0 refills | Status: DC | PRN

## 2020-09-11 NOTE — Progress Notes (Signed)
Subjective:    Patient ID: Holly Jimenez, female    DOB: 29-Aug-1945, 75 y.o.   MRN: [id number]  HPI: Holly Jimenez is a 75 y.o. female who returns for follow up appointment for chronic pain and medication refill. She states her pain is located in  her lower back. She rates her pain 6. Her current exercise regime is walking.  [name] reports last week she was nauseous and was having diarrhea, she didn't call her PCP. Today, she states the diarrhea and nauseousness have resolved. She was instructed to call her PCP if this occurs again, she verbalizes understanding.   [name] Morphine equivalent is 5.00  MME.  She  is also prescribed alprazolam  by Dr. Theda Sers .We have discussed the black box warning of using opioids and benzodiazepines. I highlighted the dangers of using these drugs together and discussed the adverse events including respiratory suppression, overdose, cognitive impairment and importance of compliance with current regimen. We will continue to monitor and adjust as indicated.    [name] pulse: 69  Her CNA in the room.     Pain Inventory Average Pain 10 Pain Right Now 6 My pain is sharp, stabbing, and aching  In the last 24 hours, has pain interfered with the following? General activity 8 Relation with others 9 Enjoyment of life 10 What TIME of day is your pain at its worst? daytime and evening Sleep (in general) Fair  Pain is worse with: walking, standing, and some activites Pain improves with: rest, heat/ice, and medication Relief from Meds: 5  Family History  Problem Relation Age of Onset   Ovarian cancer Sister    Colon cancer Sister    Heart disease Paternal Grandfather    Heart disease Maternal Grandfather    Kidney disease Brother    Kidney disease Mother    Hypertension Mother    Kidney disease Maternal Grandmother    Diabetes Paternal Grandmother    Asthma Father    Suicidality Father    Alcoholism  Father    Social History   Socioeconomic History   Marital status: Divorced    Spouse name: Not on file   Number of children: 1   Years of education: Associates   Highest education level: Not on file  Occupational History   Occupation: retired    Fish farm manager: [employer]  Tobacco Use   Smoking status: Former    Packs/day: 1.00    Years: 25.00    Pack years: 25.00    Types: Cigarettes    Quit date: 03/05/2000    Years since quitting: 20.5   Smokeless tobacco: Never  Vaping Use   Vaping Use: Never used  Substance and Sexual Activity   Alcohol use: Yes    Comment: Rare   Drug use: No    Frequency: 2.0 times per week    Types: Marijuana    Comment: previous use of marijuana for pain   Sexual activity: Not on file  Other Topics Concern   Not on file  Social History Narrative   Lives at home alone.   Right-handed.   Rare use of caffeine.   Social Determinants of Health   Financial Resource Strain: Not on file  Food Insecurity: Not on file  Transportation Needs: Not on file  Physical Activity: Not on file  Stress: Not on file  Social Connections: Not on file   Past Surgical History:  Procedure Laterality Date   [place]  1972   FOOT SURGERY     LEFT   KYPHOPLASTY Bilateral 03/08/2014   Procedure: Thoracic nine Kyphoplasty;  Surgeon: Ashok Pall, MD;  Location: [facility];  Service: Neurosurgery;  Laterality: Bilateral;  Thoracic nine Kyphoplasty   LIPOMA EXCISION     back   TONSILLECTOMY     WRIST GANGLION EXCISION     left   Past Surgical History:  Procedure Laterality Date   ABDOMINAL HYSTERECTOMY  1975   APPENDECTOMY  1972   FOOT SURGERY     LEFT   KYPHOPLASTY Bilateral 03/08/2014   Procedure: Thoracic nine Kyphoplasty;  Surgeon: Ashok Pall, MD;  Location: [facility];  Service: Neurosurgery;  Laterality: Bilateral;  Thoracic nine Kyphoplasty   LIPOMA EXCISION     back   TONSILLECTOMY     WRIST GANGLION  EXCISION     left   Past Medical History:  Diagnosis Date   Abnormal LFTs    Allergic rhinitis    Anemia    Anxiety    Arthritis    Chronic back pain    Chronic headaches    Chronic kidney disease, unspecified    DDD (degenerative disc disease)    GERD (gastroesophageal reflux disease)    Headache    Hypercalcemia    Hyperkalemia    Hyperlipidemia    Hypertension    KIDNEY SPECIALIST TOOK OFF BENICAR   IBS (irritable bowel syndrome)    Lupus (HCC)    Mixed connective tissue disease (HCC)    Nausea    Neuropathy    Peptic ulcer disease    Raynaud's disease    Small kidney    left   Vitamin D deficiency disease    BP 97/61   Pulse (!) 101   Temp 98.3 F (36.8 C) (Oral)   Ht 5\' 1"  (1.549 m)   Wt 112 lb (50.8 kg)   SpO2 96%   BMI 21.16 kg/m   Opioid Risk Score:   Fall Risk Score:  `1  Depression screen PHQ 2/9  Depression screen [place] 2/9 08/10/2020 07/10/2020 11/29/2019 10/28/2019 09/21/2019  Decreased Interest 0 0 0 0 3  Down, Depressed, Hopeless 0 0 0 0 2  PHQ - 2 Score 0 0 0 0 5  Altered sleeping - - - - 2  Tired, decreased energy - - - - 3  Change in appetite - - - - 2  Feeling bad or failure about yourself  - - - - 1  Trouble concentrating - - - - 1  Moving slowly or fidgety/restless - - - - 2  Suicidal thoughts - - - - 2  PHQ-9 Score - - - - 18  Difficult doing work/chores - - - - Extremely dIfficult     Review of Systems  Musculoskeletal:  Positive for back pain.  All other systems reviewed and are negative.     Objective:   Physical Exam Vitals and nursing note reviewed.  Constitutional:      Appearance: Normal appearance.  Cardiovascular:     Rate and Rhythm: Normal rate and regular rhythm.     Pulses: Normal pulses.     Heart sounds: Normal heart sounds.  Pulmonary:     Effort: Pulmonary effort is normal.     Breath sounds: Normal breath sounds.  Musculoskeletal:     Cervical back: Normal range of motion and neck supple.     Comments:  Normal Muscle Bulk and Muscle Testing Reveals:  Upper Extremities: Decreased ROM  90 Degrees and Muscle Strength 5/5 Lower Extremities: Full ROM and Muscle Strength 5/5 Arises from Table Slowly Narrow Based  Gait     Skin:    General: Skin is warm and dry.  Neurological:     Mental Status: She is alert and oriented to person, place, and time.  Psychiatric:        Mood and Affect: Mood normal.        Behavior: Behavior normal.         Assessment & Plan:  Lumbar Spinal Stenosis: Continue HEP as Tolerated . Continue to monitor. 09/11/2020 Chronic Low Back Pain without sciatica: Continue HEP as Tolerated. Continue current medication regimen. 09/11/2020 Intercostal Neuralgia: No complaints today. Continue Lyrica. Continue to Monitor.09/11/2020 Chronic Pain Syndrome: Refilled: Hydrocodone 5mg /325 one tablet daily as needed for pain #30.  We will continue the opioid monitoring program, this consists of regular clinic visits, examinations, urine drug screen, pill counts as well as use of [place] Controlled Substance Reporting system. A 12 month History has been reviewed on the [place] Controlled Substance Reporting System on 09/11/2020.  F/U in 1 month

## 2020-09-21 DIAGNOSIS — M351 Other overlap syndromes: Secondary | ICD-10-CM | POA: Diagnosis not present

## 2020-09-21 DIAGNOSIS — N183 Chronic kidney disease, stage 3 unspecified: Secondary | ICD-10-CM | POA: Diagnosis not present

## 2020-09-21 DIAGNOSIS — D649 Anemia, unspecified: Secondary | ICD-10-CM | POA: Diagnosis not present

## 2020-09-21 DIAGNOSIS — M5136 Other intervertebral disc degeneration, lumbar region: Secondary | ICD-10-CM | POA: Diagnosis not present

## 2020-09-21 DIAGNOSIS — M25512 Pain in left shoulder: Secondary | ICD-10-CM | POA: Diagnosis not present

## 2020-09-21 DIAGNOSIS — M549 Dorsalgia, unspecified: Secondary | ICD-10-CM | POA: Diagnosis not present

## 2020-09-21 DIAGNOSIS — M3589 Other specified systemic involvement of connective tissue: Secondary | ICD-10-CM | POA: Diagnosis not present

## 2020-09-21 DIAGNOSIS — M25511 Pain in right shoulder: Secondary | ICD-10-CM | POA: Diagnosis not present

## 2020-09-21 DIAGNOSIS — Z79899 Other long term (current) drug therapy: Secondary | ICD-10-CM | POA: Diagnosis not present

## 2020-09-21 DIAGNOSIS — M1990 Unspecified osteoarthritis, unspecified site: Secondary | ICD-10-CM | POA: Diagnosis not present

## 2020-09-21 DIAGNOSIS — M810 Age-related osteoporosis without current pathological fracture: Secondary | ICD-10-CM | POA: Diagnosis not present

## 2020-09-25 DIAGNOSIS — M47816 Spondylosis without myelopathy or radiculopathy, lumbar region: Secondary | ICD-10-CM | POA: Diagnosis not present

## 2020-09-26 DIAGNOSIS — M351 Other overlap syndromes: Secondary | ICD-10-CM | POA: Diagnosis not present

## 2020-10-05 DIAGNOSIS — I129 Hypertensive chronic kidney disease with stage 1 through stage 4 chronic kidney disease, or unspecified chronic kidney disease: Secondary | ICD-10-CM | POA: Diagnosis not present

## 2020-10-05 DIAGNOSIS — M810 Age-related osteoporosis without current pathological fracture: Secondary | ICD-10-CM | POA: Diagnosis not present

## 2020-10-05 DIAGNOSIS — M1990 Unspecified osteoarthritis, unspecified site: Secondary | ICD-10-CM | POA: Diagnosis not present

## 2020-10-05 DIAGNOSIS — N1832 Chronic kidney disease, stage 3b: Secondary | ICD-10-CM | POA: Diagnosis not present

## 2020-10-09 ENCOUNTER — Other Ambulatory Visit: Payer: Self-pay

## 2020-10-09 ENCOUNTER — Encounter: Payer: PPO | Attending: Physical Medicine and Rehabilitation | Admitting: Registered Nurse

## 2020-10-09 ENCOUNTER — Encounter: Payer: Self-pay | Admitting: Registered Nurse

## 2020-10-09 VITALS — BP 110/69 | HR 71 | Temp 99.2°F | Ht 61.0 in | Wt 110.6 lb

## 2020-10-09 DIAGNOSIS — G8929 Other chronic pain: Secondary | ICD-10-CM | POA: Diagnosis not present

## 2020-10-09 DIAGNOSIS — G894 Chronic pain syndrome: Secondary | ICD-10-CM | POA: Diagnosis not present

## 2020-10-09 DIAGNOSIS — Z79899 Other long term (current) drug therapy: Secondary | ICD-10-CM | POA: Diagnosis not present

## 2020-10-09 DIAGNOSIS — M48062 Spinal stenosis, lumbar region with neurogenic claudication: Secondary | ICD-10-CM | POA: Insufficient documentation

## 2020-10-09 DIAGNOSIS — M546 Pain in thoracic spine: Secondary | ICD-10-CM | POA: Insufficient documentation

## 2020-10-09 DIAGNOSIS — Z5181 Encounter for therapeutic drug level monitoring: Secondary | ICD-10-CM | POA: Insufficient documentation

## 2020-10-09 DIAGNOSIS — M545 Low back pain, unspecified: Secondary | ICD-10-CM | POA: Insufficient documentation

## 2020-10-09 MED ORDER — HYDROCODONE-ACETAMINOPHEN 5-325 MG PO TABS: 1.0000 | ORAL_TABLET | Freq: Every day | ORAL | 0 refills | Status: DC | PRN

## 2020-10-09 NOTE — Progress Notes (Signed)
Subjective:    Patient ID: Holly Jimenez, female    DOB: 06/02/1945, 75 y.o.   MRN: [id number]  HPI: Holly Jimenez is a 75 y.o. female who returns for follow up appointment for chronic pain and medication refill. She states her pain is located in her upper- lower back pain, denies any falls. She rates her pain 9. Her current exercise regime is walking and performing stretching exercises.  [name] Morphine equivalent is 5.00 MME.   Last UDS was Performed on 05/29/2020, it was consistent.      Pain Inventory Average Pain 10 Pain Right Now 9 My pain is intermittent, sharp, stabbing, and aching  In the last 24 hours, has pain interfered with the following? General activity 9 Relation with others 9 Enjoyment of life 9 What TIME of day is your pain at its worst? daytime and evening Sleep (in general) Good  Pain is worse with: bending, standing, and some activites Pain improves with: rest, heat/ice, and medication Relief from Meds: 7  Family History  Problem Relation Age of Onset   Ovarian cancer Sister    Colon cancer Sister    Heart disease Paternal Grandfather    Heart disease Maternal Grandfather    Kidney disease Brother    Kidney disease Mother    Hypertension Mother    Kidney disease Maternal Grandmother    Diabetes Paternal Grandmother    Asthma Father    Suicidality Father    Alcoholism Father    Social History   Socioeconomic History   Marital status: Divorced    Spouse name: Not on file   Number of children: 1   Years of education: Associates   Highest education level: Not on file  Occupational History   Occupation: retired    Fish farm manager: [employer]  Tobacco Use   Smoking status: Former    Packs/day: 1.00    Years: 25.00    Pack years: 25.00    Types: Cigarettes    Quit date: 03/05/2000    Years since quitting: 20.6   Smokeless tobacco: Never  Vaping Use   Vaping Use: Never used  Substance and Sexual Activity    Alcohol use: Yes    Comment: Rare   Drug use: No    Frequency: 2.0 times per week    Types: Marijuana    Comment: previous use of marijuana for pain   Sexual activity: Not on file  Other Topics Concern   Not on file  Social History Narrative   Lives at home alone.   Right-handed.   Rare use of caffeine.   Social Determinants of Health   Financial Resource Strain: Not on file  Food Insecurity: Not on file  Transportation Needs: Not on file  Physical Activity: Not on file  Stress: Not on file  Social Connections: Not on file   Past Surgical History:  Procedure Laterality Date   [place]   KYPHOPLASTY Bilateral 03/08/2014   Procedure: Thoracic nine Kyphoplasty;  Surgeon: Ashok Pall, MD;  Location: [facility];  Service: Neurosurgery;  Laterality: Bilateral;  Thoracic nine Kyphoplasty   LIPOMA EXCISION     back   TONSILLECTOMY     WRIST GANGLION EXCISION     left   Past Surgical History:  Procedure Laterality Date   [place]   FOOT SURGERY  LEFT   KYPHOPLASTY Bilateral 03/08/2014   Procedure: Thoracic nine Kyphoplasty;  Surgeon: Ashok Pall, MD;  Location: [facility];  Service: Neurosurgery;  Laterality: Bilateral;  Thoracic nine Kyphoplasty   LIPOMA EXCISION     back   TONSILLECTOMY     WRIST GANGLION EXCISION     left   Past Medical History:  Diagnosis Date   Abnormal LFTs    Allergic rhinitis    Anemia    Anxiety    Arthritis    Chronic back pain    Chronic headaches    Chronic kidney disease, unspecified    DDD (degenerative disc disease)    GERD (gastroesophageal reflux disease)    Headache    Hypercalcemia    Hyperkalemia    Hyperlipidemia    Hypertension    KIDNEY SPECIALIST TOOK OFF BENICAR   IBS (irritable bowel syndrome)    Lupus (HCC)    Mixed connective tissue disease (HCC)    Nausea    Neuropathy    Peptic ulcer disease     Raynaud's disease    Small kidney    left   Vitamin D deficiency disease    BP 110/69   Pulse 71   Temp 99.2 F (37.3 C)   Ht 5\' 1"  (1.549 m)   Wt 110 lb 9.6 oz (50.2 kg)   SpO2 97%   BMI 20.90 kg/m   Opioid Risk Score:   Fall Risk Score:  `1  Depression screen PHQ 2/9  Depression screen [facility] 2/9 08/10/2020 07/10/2020 11/29/2019 10/28/2019 09/21/2019  Decreased Interest 0 0 0 0 3  Down, Depressed, Hopeless 0 0 0 0 2  PHQ - 2 Score 0 0 0 0 5  Altered sleeping - - - - 2  Tired, decreased energy - - - - 3  Change in appetite - - - - 2  Feeling bad or failure about yourself  - - - - 1  Trouble concentrating - - - - 1  Moving slowly or fidgety/restless - - - - 2  Suicidal thoughts - - - - 2  PHQ-9 Score - - - - 18  Difficult doing work/chores - - - - Extremely dIfficult    Review of Systems  Musculoskeletal:  Positive for back pain and gait problem.       Mid back & lower back pain  All other systems reviewed and are negative.     Objective:   Physical Exam Vitals and nursing note reviewed.  Constitutional:      Appearance: Normal appearance.  Cardiovascular:     Rate and Rhythm: Normal rate and regular rhythm.     Pulses: Normal pulses.     Heart sounds: Normal heart sounds.  Pulmonary:     Effort: Pulmonary effort is normal.     Breath sounds: Normal breath sounds.  Musculoskeletal:     Cervical back: Normal range of motion and neck supple.     Comments: Normal Muscle Bulk and Muscle Testing Reveals:  Upper Extremities: Decreased ROM 90 Degrees and Muscle Strength 5/5 Thoracic Paraspinal Tenderness: T-1-T-2 Lumbar Paraspinal Tenderness: L-4-L-5 Lower Extremities: Full ROM and Muscle Strength 5/5 Arises from Table Slowly using cane for support Narrow Based Gait     Skin:    General: Skin is warm and dry.  Neurological:     Mental Status: She is alert and oriented to person, place, and time.  Psychiatric:        Mood and Affect: Mood normal.  Behavior: Behavior normal.         Assessment & Plan:  Lumbar Spinal Stenosis: Continue HEP as Tolerated . Continue to monitor. 10/09/2020 Chronic Low Back Pain without sciatica: Continue HEP as Tolerated. Continue current medication regimen. 10/09/2020 Intercostal Neuralgia: No complaints today. Continue Lyrica. Continue to Monitor.10/09/2020 Chronic Pain Syndrome: Refilled: Hydrocodone 5mg /325 one tablet daily as needed for pain #30.  We will continue the opioid monitoring program, this consists of regular clinic visits, examinations, urine drug screen, pill counts as well as use of [place] Controlled Substance Reporting system. A 12 month History has been reviewed on the [place] Controlled Substance Reporting System on 10/09/2020.   F/U in 1 month

## 2020-10-16 DIAGNOSIS — M47816 Spondylosis without myelopathy or radiculopathy, lumbar region: Secondary | ICD-10-CM | POA: Diagnosis not present

## 2020-10-29 DIAGNOSIS — I129 Hypertensive chronic kidney disease with stage 1 through stage 4 chronic kidney disease, or unspecified chronic kidney disease: Secondary | ICD-10-CM | POA: Diagnosis not present

## 2020-10-29 DIAGNOSIS — R7303 Prediabetes: Secondary | ICD-10-CM | POA: Diagnosis not present

## 2020-10-29 DIAGNOSIS — Z862 Personal history of diseases of the blood and blood-forming organs and certain disorders involving the immune mechanism: Secondary | ICD-10-CM | POA: Diagnosis not present

## 2020-10-30 DIAGNOSIS — M47816 Spondylosis without myelopathy or radiculopathy, lumbar region: Secondary | ICD-10-CM | POA: Diagnosis not present

## 2020-11-05 DIAGNOSIS — M1990 Unspecified osteoarthritis, unspecified site: Secondary | ICD-10-CM | POA: Diagnosis not present

## 2020-11-05 DIAGNOSIS — Z23 Encounter for immunization: Secondary | ICD-10-CM | POA: Diagnosis not present

## 2020-11-05 DIAGNOSIS — M810 Age-related osteoporosis without current pathological fracture: Secondary | ICD-10-CM | POA: Diagnosis not present

## 2020-11-05 DIAGNOSIS — I129 Hypertensive chronic kidney disease with stage 1 through stage 4 chronic kidney disease, or unspecified chronic kidney disease: Secondary | ICD-10-CM | POA: Diagnosis not present

## 2020-11-05 DIAGNOSIS — E875 Hyperkalemia: Secondary | ICD-10-CM | POA: Diagnosis not present

## 2020-11-05 DIAGNOSIS — K5900 Constipation, unspecified: Secondary | ICD-10-CM | POA: Diagnosis not present

## 2020-11-05 DIAGNOSIS — F419 Anxiety disorder, unspecified: Secondary | ICD-10-CM | POA: Diagnosis not present

## 2020-11-05 DIAGNOSIS — N1832 Chronic kidney disease, stage 3b: Secondary | ICD-10-CM | POA: Diagnosis not present

## 2020-11-05 DIAGNOSIS — M5136 Other intervertebral disc degeneration, lumbar region: Secondary | ICD-10-CM | POA: Diagnosis not present

## 2020-11-05 DIAGNOSIS — K582 Mixed irritable bowel syndrome: Secondary | ICD-10-CM | POA: Diagnosis not present

## 2020-11-05 DIAGNOSIS — R229 Localized swelling, mass and lump, unspecified: Secondary | ICD-10-CM | POA: Diagnosis not present

## 2020-11-05 DIAGNOSIS — R109 Unspecified abdominal pain: Secondary | ICD-10-CM | POA: Diagnosis not present

## 2020-11-06 ENCOUNTER — Encounter: Payer: PPO | Attending: Physical Medicine and Rehabilitation | Admitting: Physical Medicine and Rehabilitation

## 2020-11-06 ENCOUNTER — Encounter: Payer: Self-pay | Admitting: Physical Medicine and Rehabilitation

## 2020-11-06 ENCOUNTER — Other Ambulatory Visit: Payer: Self-pay

## 2020-11-06 VITALS — BP 129/69 | HR 71 | Temp 99.0°F | Ht 61.0 in | Wt 113.6 lb

## 2020-11-06 DIAGNOSIS — M545 Low back pain, unspecified: Secondary | ICD-10-CM | POA: Diagnosis not present

## 2020-11-06 DIAGNOSIS — I10 Essential (primary) hypertension: Secondary | ICD-10-CM

## 2020-11-06 DIAGNOSIS — G8929 Other chronic pain: Secondary | ICD-10-CM

## 2020-11-06 MED ORDER — HYDROCODONE-ACETAMINOPHEN 5-325 MG PO TABS: 1.0000 | ORAL_TABLET | Freq: Every day | ORAL | 0 refills | Status: DC | PRN

## 2020-11-06 NOTE — Progress Notes (Signed)
Subjective:    Patient ID: Holly Jimenez, female    DOB: January 12, 1945, 75 y.o.   MRN: [id number]  HPI  Holly Jimenez presents for follow-up of of chronic pain.   1) Intercostal neuralgia: -Pain is better.  -The cream and Gabapentin did help.  -She was resting a lot more and that has also helped.  -She is now getting in an out of bed on her own.  -Pain has improved significantly with SPRINT PNS.   2) Lower back pain: -has been bothering her more recently.  -improved with injections, feeling much better  3) Abdominal pain: -left side radiating to right.  -XR with her PCP done today  4) Incontinence of bowel: -resolved   She had a fall a few weeks ago and had a bandlike radiation of pain under her right breast. This continues now.   When she has a BM she is sometimes does not make it to the bathroom on time. She sometimes feels constipated. Grape juice with fiber has helped.   She got the back brace and it did not help.  Her son accompanies her today  Her BP is elevated this visit, though not as elevated as last visit. She as been unable to follow with her PCP to get her blood pressure medication.   She is sleeping better at night- stopped amitriptyline as it was giving her crazy dreams.   MRI thoracic and lumbar spine reviewed and shows:  This MRI of the thoracic spine without contrast shows the following: 1.   The spinal cord appears normal. 2.   Acute compression fractures of the T8, T11 and L2 vertebral bodies. 3.   Chronic compression fracture of the T9 vertebral body with prior evidence of vertebral body augmentation. 4.   Multilevel degenerative changes as detailed above that does not lead to nerve root compression or spinal stenosis.  Her walking has improved since this happens but she is still limited. She walks around the house.   She has a diagnosis of osteoporosis. She is not on any medications for osteoporosis but has a referral to  rheumatology.   Her son accompanies her today. He lives in [place] and is not with her every day.   She wants to move when she has a good day.   She has been having some decreased appetite.   Pain Inventory Average Pain 8 Pain Right Now 9 My pain is intermittent, sharp, and aching  In the last 24 hours, has pain interfered with the following? General activity 10 Relation with others 10 Enjoyment of life 10 What TIME of day is your pain at its worst? daytime Sleep (in general) NA  Pain is worse with: walking, bending, and standing Pain improves with: rest, heat/ice, and medication Relief from Meds: 6      Family History  Problem Relation Age of Onset   Ovarian cancer Sister    Colon cancer Sister    Heart disease Paternal Grandfather    Heart disease Maternal Grandfather    Kidney disease Brother    Kidney disease Mother    Hypertension Mother    Kidney disease Maternal Grandmother    Diabetes Paternal Grandmother    Asthma Father    Suicidality Father    Alcoholism Father    Social History   Socioeconomic History   Marital status: Divorced    Spouse name: Not on file   Number of children: 1   Years of education: Associates   Highest education level:  Not on file  Occupational History   Occupation: retired    Fish farm manager: [employer]  Tobacco Use   Smoking status: Former    Packs/day: 1.00    Years: 25.00    Pack years: 25.00    Types: Cigarettes    Quit date: 03/05/2000    Years since quitting: 20.6   Smokeless tobacco: Never  Vaping Use   Vaping Use: Never used  Substance and Sexual Activity   Alcohol use: Yes    Comment: Rare   Drug use: No    Frequency: 2.0 times per week    Types: Marijuana    Comment: previous use of marijuana for pain   Sexual activity: Not on file  Other Topics Concern   Not on file  Social History Narrative   Lives at home alone.   Right-handed.   Rare use of caffeine.   Social Determinants of Health    Financial Resource Strain: Not on file  Food Insecurity: Not on file  Transportation Needs: Not on file  Physical Activity: Not on file  Stress: Not on file  Social Connections: Not on file   Past Surgical History:  Procedure Laterality Date   [place]   KYPHOPLASTY Bilateral 03/08/2014   Procedure: Thoracic nine Kyphoplasty;  Surgeon: Ashok Pall, MD;  Location: [place] [facility];  Service: Neurosurgery;  Laterality: Bilateral;  Thoracic nine Kyphoplasty   LIPOMA EXCISION     back   TONSILLECTOMY     WRIST GANGLION EXCISION     left   Past Medical History:  Diagnosis Date   Abnormal LFTs    Allergic rhinitis    Anemia    Anxiety    Arthritis    Chronic back pain    Chronic headaches    Chronic kidney disease, unspecified    DDD (degenerative disc disease)    GERD (gastroesophageal reflux disease)    Headache    Hypercalcemia    Hyperkalemia    Hyperlipidemia    Hypertension    KIDNEY SPECIALIST TOOK OFF BENICAR   IBS (irritable bowel syndrome)    Lupus (HCC)    Mixed connective tissue disease (HCC)    Nausea    Neuropathy    Peptic ulcer disease    Raynaud's disease    Small kidney    left   Vitamin D deficiency disease    There were no vitals taken for this visit.  Opioid Risk Score:   Fall Risk Score:  `1  Depression screen PHQ 2/9  Depression screen [facility] 2/9 10/09/2020 08/10/2020 07/10/2020 11/29/2019 10/28/2019 09/21/2019  Decreased Interest 0 0 0 0 0 3  Down, Depressed, Hopeless 0 0 0 0 0 2  PHQ - 2 Score 0 0 0 0 0 5  Altered sleeping - - - - - 2  Tired, decreased energy - - - - - 3  Change in appetite - - - - - 2  Feeling bad or failure about yourself  - - - - - 1  Trouble concentrating - - - - - 1  Moving slowly or fidgety/restless - - - - - 2  Suicidal thoughts - - - - - 2  PHQ-9 Score - - - - - 18  Difficult doing work/chores - - - - - Extremely dIfficult    Review of Systems   Constitutional: Negative.   HENT: Negative.    Eyes: Negative.  Respiratory: Negative.    Gastrointestinal: Negative.   Endocrine: Negative.   Genitourinary: Negative.   Musculoskeletal:  Positive for back pain and gait problem.       Spasms   Skin: Negative.   Allergic/Immunologic: Negative.   Neurological:  Positive for tremors and weakness.  Hematological: Negative.   Psychiatric/Behavioral:  The patient is nervous/anxious.   All other systems reviewed and are negative.     Objective:   Physical Exam Gen: no distress, normal appearing, BMI 21.46 HEENT: oral mucosa pink and moist, NCAT Cardio: Reg rate Chest: normal effort, normal rate of breathing Abd: soft, non-distended Ext: no edema Psych: pleasant, normal affect Skin: intact Neuro: Alert and oriented, hypophonic voice Musculoskeletal: Tender to palpation over multiple compression fractures in lumbar and thoracic spine. Kyphotic posture -SPRINT PNS site c/d/i with small scab- no bleeding with removal of device.  Psych: pleasant, normal affect     Assessment & Plan:  Holly Jimenez is a 75 year old woman who presents to establish care for back pain.  Lower back pain: -MRI reviewed with patient and her son and shows evidence of multiple compression fractures.  -TLSO ordered for additional support but this did not help. Advised that this could result in weakened paraspinals but at this time it will be beneficial in providing increased support and allowing relaxation of her paraspinals to lessen spasm. -Heat packs to paraspinal muscles for relaxation -Diclofenac gel QID prn ordered as well as lidocaine patch for pain relief. -Advised that pain is way for body to let her know that something is wrong and to take it easy- try to be as active as possible, but if feeling pain take time to rest.  -Continue Gabapentin to 200mg  TID since well tolerated, helping, but pain persists.  -lidocaine patch prescribed.    -refilled Norco 5 MME -provided with pain relief journal  Osteoporosis: -Discussed medications that could help and their side effects. Advised son and patient to let me know if they would like to consider these. -Discussed the importance of weight bearing activities to preserve bone mass -Discussed the importance of eating foods rich in vitamin D and calcium and provided a hand out with some of these foods.   Constipation: discussed high fiber diet, laxatives if needed.  -Provided list of following foods that help with constipation and highlighted a few: 1) prunes- contain high amounts of fiber.  2) apples- has a form of dietary fiber called pectin that accelerates stool movement and increases beneficial gut bacteria 3) pears- in addition to fiber, also high in fructose and sorbitol which have laxative effect 4) figs- contain an enzyme ficin which helps to speed colonic transit 5) kiwis- contain an enzyme actinidin that improves gut motility and reduces constipation 6) oranges- rich in pectin (like apples) 7) grapefruits- contain a flavanol naringenin which has a laxative effect 8) vegetables- rich in fiber and also great sources of folate, vitamin C, and K 9) artichoke- high in inulin, prebiotic great for the microbiome 10) chicory- increases stool frequency and softness (can be added to coffee) 11) rhubarb- laxative effect 12) sweet potato- high fiber 13) beans, peas, and lentils- contain both soluble and insoluble fiber 14) chia seeds- improves intestinal health and gut flora 15) flaxseeds- laxative effect 16) whole grain rye bread- high in fiber 17) oat bran- high in soluble and insoluble fiber 18) kefir- softens stools -recommended to try at least one of these foods every day.  -drink 6-8 glasses of water per day -walk  regularly, especially after meals.       Insomnia: -Try to go outside near sunrise -Get exercise during the day.  -Discussed good sleep hygiene: turning  off all devices an hour before bedtime.  -Amitriptyline caused nightmares so she stopped  HTN: Excellent control today. Continue amlodipine 2.5mg  daily. Check BP at home daily and bring to f/u appointment. Advised high potassium foods.  Intercostal neuralgia: -sprint PNS removed without any complications -it has greatly helped! She currently has no complaints.

## 2020-11-22 ENCOUNTER — Telehealth: Payer: Self-pay

## 2020-11-22 DIAGNOSIS — E875 Hyperkalemia: Secondary | ICD-10-CM | POA: Diagnosis not present

## 2020-11-22 NOTE — Telephone Encounter (Signed)
Mrs Holly Jimenez wanted to let Dr Ranell Patrick know the results of her xrays. They say she has kidney stones in both kidneys. They did some blood work today. She will let you know what the results are when she is told.

## 2020-11-22 NOTE — Telephone Encounter (Signed)
Called stating she wanted to give a message to Dr. Ranell Patrick

## 2020-12-05 DIAGNOSIS — N189 Chronic kidney disease, unspecified: Secondary | ICD-10-CM | POA: Diagnosis not present

## 2020-12-05 DIAGNOSIS — I129 Hypertensive chronic kidney disease with stage 1 through stage 4 chronic kidney disease, or unspecified chronic kidney disease: Secondary | ICD-10-CM | POA: Diagnosis not present

## 2020-12-05 DIAGNOSIS — D631 Anemia in chronic kidney disease: Secondary | ICD-10-CM | POA: Diagnosis not present

## 2020-12-05 DIAGNOSIS — E875 Hyperkalemia: Secondary | ICD-10-CM | POA: Diagnosis not present

## 2020-12-05 DIAGNOSIS — M1990 Unspecified osteoarthritis, unspecified site: Secondary | ICD-10-CM | POA: Diagnosis not present

## 2020-12-05 DIAGNOSIS — N179 Acute kidney failure, unspecified: Secondary | ICD-10-CM | POA: Diagnosis not present

## 2020-12-05 DIAGNOSIS — N183 Chronic kidney disease, stage 3 unspecified: Secondary | ICD-10-CM | POA: Diagnosis not present

## 2020-12-05 DIAGNOSIS — M810 Age-related osteoporosis without current pathological fracture: Secondary | ICD-10-CM | POA: Diagnosis not present

## 2020-12-05 DIAGNOSIS — N1832 Chronic kidney disease, stage 3b: Secondary | ICD-10-CM | POA: Diagnosis not present

## 2020-12-07 ENCOUNTER — Telehealth: Payer: Self-pay

## 2020-12-07 NOTE — Telephone Encounter (Signed)
[facility] sent a fax to request refill on Norco 5-325 mg # 30. Last filled 10/15/20

## 2020-12-10 ENCOUNTER — Other Ambulatory Visit: Payer: Self-pay | Admitting: Physical Medicine and Rehabilitation

## 2020-12-10 MED ORDER — HYDROCODONE-ACETAMINOPHEN 5-325 MG PO TABS: 1.0000 | ORAL_TABLET | Freq: Every day | ORAL | 0 refills | Status: DC | PRN

## 2020-12-13 ENCOUNTER — Encounter: Payer: Self-pay | Admitting: Registered Nurse

## 2020-12-13 ENCOUNTER — Other Ambulatory Visit: Payer: Self-pay

## 2020-12-13 ENCOUNTER — Encounter: Payer: PPO | Attending: Physical Medicine and Rehabilitation | Admitting: Registered Nurse

## 2020-12-13 VITALS — BP 107/64 | HR 78 | Temp 99.0°F | Ht 61.0 in | Wt 108.0 lb

## 2020-12-13 DIAGNOSIS — M545 Low back pain, unspecified: Secondary | ICD-10-CM | POA: Diagnosis not present

## 2020-12-13 DIAGNOSIS — M542 Cervicalgia: Secondary | ICD-10-CM | POA: Diagnosis not present

## 2020-12-13 DIAGNOSIS — Z79899 Other long term (current) drug therapy: Secondary | ICD-10-CM | POA: Diagnosis not present

## 2020-12-13 DIAGNOSIS — M48062 Spinal stenosis, lumbar region with neurogenic claudication: Secondary | ICD-10-CM | POA: Insufficient documentation

## 2020-12-13 DIAGNOSIS — M778 Other enthesopathies, not elsewhere classified: Secondary | ICD-10-CM | POA: Diagnosis not present

## 2020-12-13 DIAGNOSIS — G8929 Other chronic pain: Secondary | ICD-10-CM | POA: Diagnosis not present

## 2020-12-13 DIAGNOSIS — Z5181 Encounter for therapeutic drug level monitoring: Secondary | ICD-10-CM | POA: Diagnosis not present

## 2020-12-13 DIAGNOSIS — G894 Chronic pain syndrome: Secondary | ICD-10-CM | POA: Diagnosis not present

## 2020-12-13 MED ORDER — HYDROCODONE-ACETAMINOPHEN 5-325 MG PO TABS: 1.0000 | ORAL_TABLET | Freq: Every day | ORAL | 0 refills | Status: DC | PRN

## 2020-12-13 NOTE — Progress Notes (Signed)
Subjective:    Patient ID: Holly Jimenez, female    DOB: March 22, 1945, 75 y.o.   MRN: [id number]  HPI: Holly Jimenez is a 75 y.o. female who returns for follow up appointment for chronic pain and medication refill. She states her pain is located in her neck, left shoulder and lower back pain. She rates her pain 6. Her current exercise regime is walking and performing stretching exercises.  Holly Jimenez Morphine equivalent is 5.00 MME.   She is also prescribed Alprazolam by Dr. Theda Sers .We have discussed the black box warning of using opioids and benzodiazepines. I highlighted the dangers of using these drugs together and discussed the adverse events including respiratory suppression, overdose, cognitive impairment and importance of compliance with current regimen. We will continue to monitor and adjust as indicated.   Oral Swab was Performed today.    Pain Inventory Average Pain 6 Pain Right Now 6 My pain is sharp and aching  In the last 24 hours, has pain interfered with the following? General activity 5 Relation with others 5 Enjoyment of life 5 What TIME of day is your pain at its worst? daytime and night Sleep (in general) Fair  Pain is worse with: walking, bending, and standing Pain improves with: rest, heat/ice, therapy/exercise, and medication Relief from Meds: 4  Family History  Problem Relation Age of Onset   Ovarian cancer Sister    Colon cancer Sister    Heart disease Paternal Grandfather    Heart disease Maternal Grandfather    Kidney disease Brother    Kidney disease Mother    Hypertension Mother    Kidney disease Maternal Grandmother    Diabetes Paternal Grandmother    Asthma Father    Suicidality Father    Alcoholism Father    Social History   Socioeconomic History   Marital status: Divorced    Spouse name: Not on file   Number of children: 1   Years of education: Associates   Highest education level: Not on file  Occupational  History   Occupation: retired    Fish farm manager: [employer]  Tobacco Use   Smoking status: Former    Packs/day: 1.00    Years: 25.00    Pack years: 25.00    Types: Cigarettes    Quit date: 03/05/2000    Years since quitting: 20.7   Smokeless tobacco: Never  Vaping Use   Vaping Use: Never used  Substance and Sexual Activity   Alcohol use: Yes    Comment: Rare   Drug use: No    Frequency: 2.0 times per week    Types: Marijuana    Comment: previous use of marijuana for pain   Sexual activity: Not on file  Other Topics Concern   Not on file  Social History Narrative   Lives at home alone.   Right-handed.   Rare use of caffeine.   Social Determinants of Health   Financial Resource Strain: Not on file  Food Insecurity: Not on file  Transportation Needs: Not on file  Physical Activity: Not on file  Stress: Not on file  Social Connections: Not on file   Past Surgical History:  Procedure Laterality Date   [place]   KYPHOPLASTY Bilateral 03/08/2014   Procedure: Thoracic nine Kyphoplasty;  Surgeon: Ashok Pall, MD;  Location: [place] [facility];  Service: Neurosurgery;  Laterality: Bilateral;  Thoracic nine Kyphoplasty  LIPOMA EXCISION     back   TONSILLECTOMY     WRIST GANGLION EXCISION     left   Past Surgical History:  Procedure Laterality Date   ABDOMINAL HYSTERECTOMY  1975   APPENDECTOMY  1972   FOOT SURGERY     LEFT   KYPHOPLASTY Bilateral 03/08/2014   Procedure: Thoracic nine Kyphoplasty;  Surgeon: Ashok Pall, MD;  Location: [place] [facility];  Service: Neurosurgery;  Laterality: Bilateral;  Thoracic nine Kyphoplasty   LIPOMA EXCISION     back   TONSILLECTOMY     WRIST GANGLION EXCISION     left   Past Medical History:  Diagnosis Date   Abnormal LFTs    Allergic rhinitis    Anemia    Anxiety    Arthritis    Chronic back pain    Chronic headaches    Chronic kidney disease, unspecified     DDD (degenerative disc disease)    GERD (gastroesophageal reflux disease)    Headache    Hypercalcemia    Hyperkalemia    Hyperlipidemia    Hypertension    KIDNEY SPECIALIST TOOK OFF BENICAR   IBS (irritable bowel syndrome)    Lupus (HCC)    Mixed connective tissue disease (HCC)    Nausea    Neuropathy    Peptic ulcer disease    Raynaud's disease    Small kidney    left   Vitamin D deficiency disease    BP 107/64   Pulse 78   Temp 99 F (37.2 C) (Oral)   Ht 5\' 1"  (1.549 m)   Wt 108 lb (49 kg)   SpO2 99%   BMI 20.41 kg/m   Opioid Risk Score:   Fall Risk Score:  `1  Depression screen PHQ 2/9  Depression screen [facility] 2/9 10/09/2020 08/10/2020 07/10/2020 11/29/2019 10/28/2019 09/21/2019  Decreased Interest 0 0 0 0 0 3  Down, Depressed, Hopeless 0 0 0 0 0 2  PHQ - 2 Score 0 0 0 0 0 5  Altered sleeping - - - - - 2  Tired, decreased energy - - - - - 3  Change in appetite - - - - - 2  Feeling bad or failure about yourself  - - - - - 1  Trouble concentrating - - - - - 1  Moving slowly or fidgety/restless - - - - - 2  Suicidal thoughts - - - - - 2  PHQ-9 Score - - - - - 18  Difficult doing work/chores - - - - - Extremely dIfficult      Review of Systems  Constitutional: Negative.   HENT: Negative.    Eyes: Negative.   Respiratory: Negative.    Cardiovascular: Negative.   Gastrointestinal: Negative.   Endocrine: Negative.   Genitourinary: Negative.   Musculoskeletal:  Positive for back pain.       Pain right and left side   Skin: Negative.   Allergic/Immunologic: Negative.   Neurological: Negative.   Hematological: Negative.   Psychiatric/Behavioral: Negative.        Objective:   Physical Exam Vitals and nursing note reviewed.  Constitutional:      Appearance: Normal appearance.  Neck:     Comments: Cervical Paraspinal Tenderness: C-5-C-6 Cardiovascular:     Rate and Rhythm: Normal rate and regular rhythm.     Pulses: Normal pulses.     Heart sounds: Normal  heart sounds.  Pulmonary:     Effort: Pulmonary effort is normal.  Breath sounds: Normal breath sounds.  Musculoskeletal:     Cervical back: Normal range of motion and neck supple.     Comments: Normal Muscle Bulk and Muscle Testing Reveals:  Upper Extremities: Decreased ROM  90 Degrees and Muscle Strength 5/5 Left AC Joint Tenderness  Lower Extremities: Full ROM and Muscle Strength 5/5 Arises from Table Slowly Narrow Based  Gait     Skin:    General: Skin is warm and dry.  Neurological:     Mental Status: She is alert and oriented to person, place, and time.  Psychiatric:        Mood and Affect: Mood normal.        Behavior: Behavior normal.         Assessment & Plan:  Lumbar Spinal Stenosis: Continue HEP as Tolerated . Continue to monitor. 12/13/2020 Chronic Low Back Pain without sciatica: Continue HEP as Tolerated. Continue current medication regimen. 12/13/2020 Intercostal Neuralgia: No complaints today. Continue Lyrica. Continue to Monitor.12/13/2020 Chronic Pain Syndrome: Refilled: Hydrocodone 5mg /325 one tablet daily as needed for pain #30.  We will continue the opioid monitoring program, this consists of regular clinic visits, examinations, urine drug screen, pill counts as well as use of [place] Controlled Substance Reporting system. A 12 month History has been reviewed on the [place] Controlled Substance Reporting System on 12/13/2020. Marland Kitchen Cervicalgia: Continue HEP as tolerated. Continue current medication regimen. Continue to monitor.  Left Shoulder Tendonitis: Continue to alternate Ice and Heat Therapy. Continue to Monitor.    F/U in 2 months

## 2020-12-20 ENCOUNTER — Telehealth: Payer: Self-pay | Admitting: *Deleted

## 2020-12-20 LAB — DRUG TOX MONITOR 1 W/CONF, ORAL FLD
Amphetamines: NEGATIVE ng/mL (ref <10)
Barbiturates: NEGATIVE ng/mL (ref <10)
Benzodiazepines: NEGATIVE ng/mL (ref <0.50)
Buprenorphine: NEGATIVE ng/mL (ref <0.10)
Cocaine: NEGATIVE ng/mL (ref <5.0)
Codeine: NEGATIVE ng/mL (ref <2.5)
Dihydrocodeine: NEGATIVE ng/mL (ref <2.5)
Fentanyl: NEGATIVE ng/mL (ref <0.10)
Heroin Metabolite: NEGATIVE ng/mL (ref <1.0)
Hydrocodone: 3 ng/mL — ABNORMAL HIGH (ref <2.5)
Hydromorphone: NEGATIVE ng/mL (ref <2.5)
MARIJUANA: NEGATIVE ng/mL (ref <2.5)
MDMA: NEGATIVE ng/mL (ref <10)
Meprobamate: NEGATIVE ng/mL (ref <2.5)
Methadone: NEGATIVE ng/mL (ref <5.0)
Morphine: NEGATIVE ng/mL (ref <2.5)
Nicotine Metabolite: NEGATIVE ng/mL (ref <5.0)
Norhydrocodone: NEGATIVE ng/mL (ref <2.5)
Noroxycodone: NEGATIVE ng/mL (ref <2.5)
Opiates: POSITIVE ng/mL — AB (ref <2.5)
Oxycodone: NEGATIVE ng/mL (ref <2.5)
Oxymorphone: NEGATIVE ng/mL (ref <2.5)
Phencyclidine: NEGATIVE ng/mL (ref <10)
Tapentadol: NEGATIVE ng/mL (ref <5.0)
Tramadol: NEGATIVE ng/mL (ref <5.0)
Zolpidem: NEGATIVE ng/mL (ref <5.0)

## 2020-12-20 LAB — DRUG TOX ALC METAB W/CON, ORAL FLD: Alcohol Metabolite: NEGATIVE ng/mL (ref <25)

## 2020-12-20 NOTE — Telephone Encounter (Signed)
Oral swab drug screen was consistent for prescribed medications.  ?

## 2021-01-04 DIAGNOSIS — M1990 Unspecified osteoarthritis, unspecified site: Secondary | ICD-10-CM | POA: Diagnosis not present

## 2021-01-04 DIAGNOSIS — N1832 Chronic kidney disease, stage 3b: Secondary | ICD-10-CM | POA: Diagnosis not present

## 2021-01-04 DIAGNOSIS — I129 Hypertensive chronic kidney disease with stage 1 through stage 4 chronic kidney disease, or unspecified chronic kidney disease: Secondary | ICD-10-CM | POA: Diagnosis not present

## 2021-01-04 DIAGNOSIS — M810 Age-related osteoporosis without current pathological fracture: Secondary | ICD-10-CM | POA: Diagnosis not present

## 2021-02-05 DIAGNOSIS — I129 Hypertensive chronic kidney disease with stage 1 through stage 4 chronic kidney disease, or unspecified chronic kidney disease: Secondary | ICD-10-CM | POA: Diagnosis not present

## 2021-02-05 DIAGNOSIS — N1832 Chronic kidney disease, stage 3b: Secondary | ICD-10-CM | POA: Diagnosis not present

## 2021-02-05 DIAGNOSIS — M1990 Unspecified osteoarthritis, unspecified site: Secondary | ICD-10-CM | POA: Diagnosis not present

## 2021-02-05 DIAGNOSIS — M810 Age-related osteoporosis without current pathological fracture: Secondary | ICD-10-CM | POA: Diagnosis not present

## 2021-02-12 ENCOUNTER — Encounter: Payer: Self-pay | Admitting: Registered Nurse

## 2021-02-12 ENCOUNTER — Other Ambulatory Visit: Payer: Self-pay

## 2021-02-12 ENCOUNTER — Encounter: Payer: PPO | Attending: Physical Medicine and Rehabilitation | Admitting: Registered Nurse

## 2021-02-12 VITALS — BP 118/68 | HR 65 | Ht 61.0 in | Wt 111.8 lb

## 2021-02-12 DIAGNOSIS — M48062 Spinal stenosis, lumbar region with neurogenic claudication: Secondary | ICD-10-CM | POA: Diagnosis not present

## 2021-02-12 DIAGNOSIS — G588 Other specified mononeuropathies: Secondary | ICD-10-CM | POA: Insufficient documentation

## 2021-02-12 DIAGNOSIS — Z5181 Encounter for therapeutic drug level monitoring: Secondary | ICD-10-CM | POA: Diagnosis not present

## 2021-02-12 DIAGNOSIS — M542 Cervicalgia: Secondary | ICD-10-CM | POA: Insufficient documentation

## 2021-02-12 DIAGNOSIS — G894 Chronic pain syndrome: Secondary | ICD-10-CM | POA: Insufficient documentation

## 2021-02-12 DIAGNOSIS — Z79899 Other long term (current) drug therapy: Secondary | ICD-10-CM | POA: Insufficient documentation

## 2021-02-12 DIAGNOSIS — M5412 Radiculopathy, cervical region: Secondary | ICD-10-CM | POA: Diagnosis not present

## 2021-02-12 MED ORDER — HYDROCODONE-ACETAMINOPHEN 5-325 MG PO TABS: 1.0000 | ORAL_TABLET | Freq: Every day | ORAL | 0 refills | Status: DC | PRN

## 2021-02-12 NOTE — Progress Notes (Signed)
Subjective:    Patient ID: Holly Jimenez, female    DOB: July 30, 1945, 76 y.o.   MRN: [id number]  HPI: Holly Jimenez is a 76 y.o. female who returns for follow up appointment for chronic pain and medication refill. She states her pain is located in her neck radiating into her left shoulder. She rated her pain 0. Her current exercise regime is walking and performing stretching exercises.  Holly Jimenez Morphine equivalent is 5.00 MME.   Last Oral Swab was Performed on 12/13/2020, it was consistent.     Pain Inventory Average Pain 6 Pain Right Now 0 My pain is intermittent, sharp, stabbing, and aching  In the last 24 hours, has pain interfered with the following? General activity 4 Relation with others 4 Enjoyment of life 4 What TIME of day is your pain at its worst? varies Sleep (in general) Good  Pain is worse with: bending and some activites Pain improves with: rest, heat/ice, pacing activities, medication, and TENS Relief from Meds: 5  Family History  Problem Relation Age of Onset   Ovarian cancer Sister    Colon cancer Sister    Heart disease Paternal Grandfather    Heart disease Maternal Grandfather    Kidney disease Brother    Kidney disease Mother    Hypertension Mother    Kidney disease Maternal Grandmother    Diabetes Paternal Grandmother    Asthma Father    Suicidality Father    Alcoholism Father    Social History   Socioeconomic History   Marital status: Divorced    Spouse name: Not on file   Number of children: 1   Years of education: Associates   Highest education level: Not on file  Occupational History   Occupation: retired    Fish farm manager: [employer]  Tobacco Use   Smoking status: Former    Packs/day: 1.00    Years: 25.00    Pack years: 25.00    Types: Cigarettes    Quit date: 03/05/2000    Years since quitting: 20.9   Smokeless tobacco: Never  Vaping Use   Vaping Use: Never used  Substance and Sexual Activity    Alcohol use: Yes    Comment: Rare   Drug use: No    Frequency: 2.0 times per week    Types: Marijuana    Comment: previous use of marijuana for pain   Sexual activity: Not on file  Other Topics Concern   Not on file  Social History Narrative   Lives at home alone.   Right-handed.   Rare use of caffeine.   Social Determinants of Health   Financial Resource Strain: Not on file  Food Insecurity: Not on file  Transportation Needs: Not on file  Physical Activity: Not on file  Stress: Not on file  Social Connections: Not on file   Past Surgical History:  Procedure Laterality Date   [place]   KYPHOPLASTY Bilateral 03/08/2014   Procedure: Thoracic nine Kyphoplasty;  Surgeon: Ashok Pall, MD;  Location: [place] [facility];  Service: Neurosurgery;  Laterality: Bilateral;  Thoracic nine Kyphoplasty   LIPOMA EXCISION     back   TONSILLECTOMY     WRIST GANGLION EXCISION     left   Past Surgical History:  Procedure Laterality Date   ABDOMINAL HYSTERECTOMY  1975   APPENDECTOMY  1972   FOOT SURGERY     LEFT  KYPHOPLASTY Bilateral 03/08/2014   Procedure: Thoracic nine Kyphoplasty;  Surgeon: Ashok Pall, MD;  Location: [place] [facility];  Service: Neurosurgery;  Laterality: Bilateral;  Thoracic nine Kyphoplasty   LIPOMA EXCISION     back   TONSILLECTOMY     WRIST GANGLION EXCISION     left   Past Medical History:  Diagnosis Date   Abnormal LFTs    Allergic rhinitis    Anemia    Anxiety    Arthritis    Chronic back pain    Chronic headaches    Chronic kidney disease, unspecified    DDD (degenerative disc disease)    GERD (gastroesophageal reflux disease)    Headache    Hypercalcemia    Hyperkalemia    Hyperlipidemia    Hypertension    KIDNEY SPECIALIST TOOK OFF BENICAR   IBS (irritable bowel syndrome)    Lupus (HCC)    Mixed connective tissue disease (HCC)    Nausea    Neuropathy    Peptic ulcer disease     Raynaud's disease    Small kidney    left   Vitamin D deficiency disease    BP 118/68    Pulse 65    Ht 5\' 1"  (1.549 m)    Wt 111 lb 12.8 oz (50.7 kg)    SpO2 94%    BMI 21.12 kg/m   Opioid Risk Score:   Fall Risk Score:  `1  Depression screen PHQ 2/9  Depression screen [facility] 2/9 02/12/2021 10/09/2020 08/10/2020 07/10/2020 11/29/2019 10/28/2019 09/21/2019  Decreased Interest 0 0 0 0 0 0 3  Down, Depressed, Hopeless 0 0 0 0 0 0 2  PHQ - 2 Score 0 0 0 0 0 0 5  Altered sleeping - - - - - - 2  Tired, decreased energy - - - - - - 3  Change in appetite - - - - - - 2  Feeling bad or failure about yourself  - - - - - - 1  Trouble concentrating - - - - - - 1  Moving slowly or fidgety/restless - - - - - - 2  Suicidal thoughts - - - - - - 2  PHQ-9 Score - - - - - - 18  Difficult doing work/chores - - - - - - Extremely dIfficult     Review of Systems  Musculoskeletal:  Positive for neck pain.       Bilateral side pain  Neurological:  Positive for weakness.  All other systems reviewed and are negative.     Objective:   Physical Exam Vitals and nursing note reviewed.  Constitutional:      Appearance: Normal appearance.  Cardiovascular:     Rate and Rhythm: Normal rate and regular rhythm.     Pulses: Normal pulses.     Heart sounds: Normal heart sounds.  Pulmonary:     Effort: Pulmonary effort is normal.     Breath sounds: Normal breath sounds.  Musculoskeletal:     Cervical back: Normal range of motion and neck supple.     Comments: Normal Muscle Bulk and Muscle Testing Reveals:  Upper Extremities: Decreased ROM  90 Degrees and Muscle Strength 4/5 Thoracic Paraspinal Tenderness: T-1-T-2 Mainly Left Side   Lower Extremities: Full ROM and Muscle  Strength 5/5 Arises from Table slowly using cane for support.  Narrow Based Gait      Skin:    General: Skin is warm and dry.  Neurological:     Mental  Status: She is alert and oriented to person, place, and time.  Psychiatric:         Mood and Affect: Mood normal.        Behavior: Behavior normal.         Assessment & Plan:  Lumbar Spinal Stenosis: Continue HEP as Tolerated . Continue to monitor. 02/12/2021 Chronic Low Back Pain without sciatica: Continue HEP as Tolerated. Continue current medication regimen. 02/12/2021 Intercostal Neuralgia:  Continue Lyrica. Continue to Monitor.02/12/2021 Chronic Pain Syndrome: Refilled: Hydrocodone 5mg /325 one tablet daily as needed for pain #30. Second script sent for the following month.  We will continue the opioid monitoring program, this consists of regular clinic visits, examinations, urine drug screen, pill counts as well as use of [place] Controlled Substance Reporting system. A 12 month History has been reviewed on the [place] Controlled Substance Reporting System on 02/12/2021. Marland KitchenCervicalgia/ Cervical Radiculitis: Continue HEP as tolerated. Continue current medication regimen. Continue to monitor. 02/12/2021 Left Shoulder Tendonitis: No Complaints Today. Continue to alternate Ice and Heat Therapy. Continue to Monitor. 02/12/2021   F/U in 2 months

## 2021-03-13 DIAGNOSIS — D631 Anemia in chronic kidney disease: Secondary | ICD-10-CM | POA: Diagnosis not present

## 2021-03-13 DIAGNOSIS — N189 Chronic kidney disease, unspecified: Secondary | ICD-10-CM | POA: Diagnosis not present

## 2021-03-13 DIAGNOSIS — I129 Hypertensive chronic kidney disease with stage 1 through stage 4 chronic kidney disease, or unspecified chronic kidney disease: Secondary | ICD-10-CM | POA: Diagnosis not present

## 2021-03-13 DIAGNOSIS — N179 Acute kidney failure, unspecified: Secondary | ICD-10-CM | POA: Diagnosis not present

## 2021-03-13 DIAGNOSIS — N183 Chronic kidney disease, stage 3 unspecified: Secondary | ICD-10-CM | POA: Diagnosis not present

## 2021-03-13 DIAGNOSIS — E875 Hyperkalemia: Secondary | ICD-10-CM | POA: Diagnosis not present

## 2021-03-15 ENCOUNTER — Other Ambulatory Visit: Payer: Self-pay | Admitting: Nephrology

## 2021-03-15 DIAGNOSIS — N179 Acute kidney failure, unspecified: Secondary | ICD-10-CM

## 2021-03-26 ENCOUNTER — Ambulatory Visit
Admission: RE | Admit: 2021-03-26 | Discharge: 2021-03-26 | Disposition: A | Discharge status: Home / Self Care | Payer: HMO | Source: Ambulatory Visit | Attending: Nephrology | Admitting: Nephrology

## 2021-03-26 DIAGNOSIS — N179 Acute kidney failure, unspecified: Secondary | ICD-10-CM | POA: Diagnosis not present

## 2021-03-27 DIAGNOSIS — N183 Chronic kidney disease, stage 3 unspecified: Secondary | ICD-10-CM | POA: Diagnosis not present

## 2021-04-02 ENCOUNTER — Ambulatory Visit: Payer: PPO | Admitting: Physical Medicine and Rehabilitation

## 2021-04-09 ENCOUNTER — Encounter: Payer: PPO | Attending: Physical Medicine and Rehabilitation | Admitting: Registered Nurse

## 2021-04-09 ENCOUNTER — Encounter: Payer: Self-pay | Admitting: Registered Nurse

## 2021-04-09 VITALS — BP 109/68 | HR 69 | Ht 61.0 in | Wt 109.0 lb

## 2021-04-09 DIAGNOSIS — M545 Low back pain, unspecified: Secondary | ICD-10-CM | POA: Insufficient documentation

## 2021-04-09 DIAGNOSIS — G894 Chronic pain syndrome: Secondary | ICD-10-CM | POA: Diagnosis not present

## 2021-04-09 DIAGNOSIS — Z5181 Encounter for therapeutic drug level monitoring: Secondary | ICD-10-CM | POA: Diagnosis not present

## 2021-04-09 DIAGNOSIS — Z79899 Other long term (current) drug therapy: Secondary | ICD-10-CM | POA: Diagnosis not present

## 2021-04-09 DIAGNOSIS — M546 Pain in thoracic spine: Secondary | ICD-10-CM | POA: Diagnosis not present

## 2021-04-09 DIAGNOSIS — G8929 Other chronic pain: Secondary | ICD-10-CM | POA: Diagnosis not present

## 2021-04-09 MED ORDER — HYDROCODONE-ACETAMINOPHEN 5-325 MG PO TABS: 1.0000 | ORAL_TABLET | Freq: Every day | ORAL | 0 refills | Status: DC | PRN

## 2021-04-09 NOTE — Progress Notes (Signed)
? ?Subjective:  ? ? Patient ID: Holly Jimenez, female    DOB: 1945-06-01, 76 y.o.   MRN: [id number] ? ?HPI: CARLETTA Jimenez is a 76 y.o. female who returns for follow up appointment for chronic pain and medication refill. She states her pain is located in her mid- back. She rates her pain 9. Her current exercise regime is walking and performing stretching exercises. ? ?[name] she is also prescribed alprazolam by Dr. Theda Sers .We have discussed the black box warning of using opioids and benzodiazepines. I highlighted the dangers of using these drugs together and discussed the adverse events including respiratory suppression, overdose, cognitive impairment and importance of compliance with current regimen. We will continue to monitor and adjust as indicated.  ? ?Oral Swab was Performed Today.  ?  ? ?Pain Inventory ?Average Pain 5 ?Pain Right Now 9 ?My pain is intermittent and aching ? ?In the last 24 hours, has pain interfered with the following? ?General activity 8 ?Relation with others 8 ?Enjoyment of life 0 ?What TIME of day is your pain at its worst? varies ?Sleep (in general) Good ? ?Pain is worse with: walking, bending, and standing ?Pain improves with: heat/ice, medication, and Massager ?Relief from Meds: 4 ? ?Family History  ?Problem Relation Age of Onset  ? Ovarian cancer Sister   ? Colon cancer Sister   ? Heart disease Paternal Grandfather   ? Heart disease Maternal Grandfather   ? Kidney disease Brother   ? Kidney disease Mother   ? Hypertension Mother   ? Kidney disease Maternal Grandmother   ? Diabetes Paternal Grandmother   ? Asthma Father   ? Suicidality Father   ? Alcoholism Father   ? ?Social History  ? ?Socioeconomic History  ? Marital status: Divorced  ?  Spouse name: Not on file  ? Number of children: 1  ? Years of education: Associates  ? Highest education level: Not on file  ?Occupational History  ? Occupation: retired  ?  Employer: [employer]  ?Tobacco Use   ? Smoking status: Former  ?  Packs/day: 1.00  ?  Years: 25.00  ?  Pack years: 25.00  ?  Types: Cigarettes  ?  Quit date: 03/05/2000  ?  Years since quitting: 21.1  ? Smokeless tobacco: Never  ?Vaping Use  ? Vaping Use: Never used  ?Substance and Sexual Activity  ? Alcohol use: Yes  ?  Comment: Rare  ? Drug use: No  ?  Frequency: 2.0 times per week  ?  Types: Marijuana  ?  Comment: previous use of marijuana for pain  ? Sexual activity: Not on file  ?Other Topics Concern  ? Not on file  ?Social History Narrative  ? Lives at home alone.  ? Right-handed.  ? Rare use of caffeine.  ? ?Social Determinants of Health  ? ?Financial Resource Strain: Not on file  ?Food Insecurity: Not on file  ?Transportation Needs: Not on file  ?Physical Activity: Not on file  ?Stress: Not on file  ?Social Connections: Not on file  ? ?Past Surgical History:  ?Procedure Laterality Date  ? ABDOMINAL HYSTERECTOMY  1975  ? APPENDECTOMY  1972  ? FOOT SURGERY    ? LEFT  ? KYPHOPLASTY Bilateral 03/08/2014  ? Procedure: Thoracic nine Kyphoplasty;  Surgeon: Ashok Pall, MD;  Location: [place] [facility];  Service: Neurosurgery;  Laterality: Bilateral;  Thoracic nine Kyphoplasty  ? LIPOMA EXCISION    ? back  ? TONSILLECTOMY    ?  WRIST GANGLION EXCISION    ? left  ? ?Past Surgical History:  ?Procedure Laterality Date  ? ABDOMINAL HYSTERECTOMY  1975  ? APPENDECTOMY  1972  ? FOOT SURGERY    ? LEFT  ? KYPHOPLASTY Bilateral 03/08/2014  ? Procedure: Thoracic nine Kyphoplasty;  Surgeon: Ashok Pall, MD;  Location: [place] [facility];  Service: Neurosurgery;  Laterality: Bilateral;  Thoracic nine Kyphoplasty  ? LIPOMA EXCISION    ? back  ? TONSILLECTOMY    ? WRIST GANGLION EXCISION    ? left  ? ?Past Medical History:  ?Diagnosis Date  ? Abnormal LFTs   ? Allergic rhinitis   ? Anemia   ? Anxiety   ? Arthritis   ? Chronic back pain   ? Chronic headaches   ? Chronic kidney disease, unspecified   ? DDD (degenerative disc disease)   ? GERD (gastroesophageal reflux disease)   ?  Headache   ? Hypercalcemia   ? Hyperkalemia   ? Hyperlipidemia   ? Hypertension   ? KIDNEY SPECIALIST TOOK OFF BENICAR  ? IBS (irritable bowel syndrome)   ? Lupus ([place])   ? Mixed connective tissue disease ([place])   ? Nausea   ? Neuropathy   ? Peptic ulcer disease   ? Raynaud's disease   ? Small kidney   ? left  ? Vitamin D deficiency disease   ? ?BP 109/68   Pulse 69   Ht '5\' 1"'$  (1.549 m)   Wt 109 lb (49.4 kg)   SpO2 98%   BMI 20.60 kg/m?  ? ?Opioid Risk Score:   ?Fall Risk Score:  `1 ? ?Depression screen PHQ 2/9 ? ? ?  04/09/2021  ? 11:38 AM 02/12/2021  ? 11:14 AM 10/09/2020  ? 11:27 AM 08/10/2020  ?  2:39 PM 07/10/2020  ? 11:16 AM 11/29/2019  ?  1:19 PM 10/28/2019  ?  2:23 PM  ?Depression screen PHQ 2/9  ?Decreased Interest 0 0 0 0 0 0 0  ?Down, Depressed, Hopeless 0 0 0 0 0 0 0  ?PHQ - 2 Score 0 0 0 0 0 0 0  ?  ? ?Review of Systems  ?Constitutional: Negative.   ?HENT: Negative.    ?Eyes: Negative.   ?Respiratory: Negative.    ?Cardiovascular: Negative.   ?Gastrointestinal: Negative.   ?Endocrine: Negative.   ?Genitourinary: Negative.   ?Musculoskeletal:  Positive for back pain and gait problem.  ?Skin: Negative.   ?Allergic/Immunologic: Negative.   ?Hematological: Negative.   ?Psychiatric/Behavioral: Negative.    ? ?   ?Objective:  ? Physical Exam ?Vitals and nursing note reviewed.  ?Constitutional:   ?   Appearance: Normal appearance.  ?Cardiovascular:  ?   Rate and Rhythm: Normal rate and regular rhythm.  ?   Pulses: Normal pulses.  ?   Heart sounds: Normal heart sounds.  ?Pulmonary:  ?   Effort: Pulmonary effort is normal.  ?   Breath sounds: Normal breath sounds.  ?Musculoskeletal:  ?   Cervical back: Normal range of motion and neck supple.  ?   Comments: Normal Muscle Bulk and Muscle Testing Reveals:  ?Upper Extremities: Full ROM and Muscle Strength  5/5 ?Thoracic Paraspinal Tenderness: T-7-T-9 ?Lower Extremities : Full ROM and Muscle Strength 5/5 ?Arises from Table Slowly ?Narrow Based Gait  ?   ?Skin: ?    General: Skin is warm and dry.  ?Neurological:  ?   Mental Status: She is alert and oriented to person, place, and time.  ?Psychiatric:     ?  Mood and Affect: Mood normal.     ?   Behavior: Behavior normal.  ? ? ? ? ?   ?Assessment & Plan:  ?Lumbar Spinal Stenosis: Continue HEP as Tolerated . Continue to monitor. 04/09/2021 ?Chronic Low Back Pain without sciatica: Continue HEP as Tolerated. Continue current medication regimen. 04/09/2021 ?Intercostal Neuralgia:  Continue Lyrica. Continue to Monitor.04/09/2021 ?Chronic Pain Syndrome: Refilled: Hydrocodone '5mg'$ /325 one tablet daily as needed for pain #30. Second script sent for the following month.  ?We will continue the opioid monitoring program, this consists of regular clinic visits, examinations, urine drug screen, pill counts as well as use of [place] Controlled Substance Reporting system. A 12 month History has been reviewed on the [place] Controlled Substance Reporting System on 04/09/2021. ?.Cervicalgia/ Cervical Radiculitis: Continue HEP as tolerated. Continue current medication regimen. Continue to monitor. 04/09/2021 ?Left Shoulder Tendonitis: No Complaints Today. Continue to alternate Ice and Heat Therapy. Continue to Monitor. 04/09/2021 ?  ?F/U in 2 months ?  ?  ?  ? ? ? ? ? ? ? ?

## 2021-04-16 LAB — DRUG TOX MONITOR 1 W/CONF, ORAL FLD

## 2021-04-16 LAB — DRUG TOX ALC METAB W/CON, ORAL FLD: Alcohol Metabolite: NEGATIVE ng/mL (ref <25)

## 2021-04-25 ENCOUNTER — Telehealth: Payer: Self-pay | Admitting: *Deleted

## 2021-04-25 NOTE — Telephone Encounter (Signed)
Oral swab drug screen was negative for prescribed medications.  ?

## 2021-05-05 DIAGNOSIS — N1832 Chronic kidney disease, stage 3b: Secondary | ICD-10-CM | POA: Diagnosis not present

## 2021-05-05 DIAGNOSIS — I129 Hypertensive chronic kidney disease with stage 1 through stage 4 chronic kidney disease, or unspecified chronic kidney disease: Secondary | ICD-10-CM | POA: Diagnosis not present

## 2021-05-05 DIAGNOSIS — M810 Age-related osteoporosis without current pathological fracture: Secondary | ICD-10-CM | POA: Diagnosis not present

## 2021-05-05 DIAGNOSIS — M1990 Unspecified osteoarthritis, unspecified site: Secondary | ICD-10-CM | POA: Diagnosis not present

## 2021-05-08 ENCOUNTER — Other Ambulatory Visit: Payer: Self-pay | Admitting: Registered Nurse

## 2021-05-09 NOTE — Telephone Encounter (Signed)
PMP was Reviewed.  ?Hydrocodone e-scribed today. ?Placed a call to Ms. Bogie regarding the above. She verbalizes understanding.  ?

## 2021-06-11 ENCOUNTER — Encounter: Payer: PPO | Attending: Physical Medicine and Rehabilitation | Admitting: Registered Nurse

## 2021-06-11 ENCOUNTER — Encounter: Payer: Self-pay | Admitting: Registered Nurse

## 2021-06-11 ENCOUNTER — Other Ambulatory Visit: Payer: Self-pay | Admitting: Registered Nurse

## 2021-06-11 VITALS — BP 130/71 | HR 59 | Ht 61.0 in | Wt 106.2 lb

## 2021-06-11 DIAGNOSIS — G8929 Other chronic pain: Secondary | ICD-10-CM | POA: Diagnosis not present

## 2021-06-11 DIAGNOSIS — Z5181 Encounter for therapeutic drug level monitoring: Secondary | ICD-10-CM | POA: Diagnosis not present

## 2021-06-11 DIAGNOSIS — M542 Cervicalgia: Secondary | ICD-10-CM | POA: Insufficient documentation

## 2021-06-11 DIAGNOSIS — Z79899 Other long term (current) drug therapy: Secondary | ICD-10-CM | POA: Insufficient documentation

## 2021-06-11 DIAGNOSIS — M546 Pain in thoracic spine: Secondary | ICD-10-CM | POA: Insufficient documentation

## 2021-06-11 DIAGNOSIS — M545 Low back pain, unspecified: Secondary | ICD-10-CM | POA: Insufficient documentation

## 2021-06-11 DIAGNOSIS — G894 Chronic pain syndrome: Secondary | ICD-10-CM | POA: Insufficient documentation

## 2021-06-11 DIAGNOSIS — M48062 Spinal stenosis, lumbar region with neurogenic claudication: Secondary | ICD-10-CM | POA: Insufficient documentation

## 2021-06-11 MED ORDER — HYDROCODONE-ACETAMINOPHEN 5-325 MG PO TABS: ORAL_TABLET | ORAL | 0 refills | Status: DC

## 2021-06-11 NOTE — Progress Notes (Signed)
Subjective:    Patient ID: Holly Jimenez, female    DOB: 11-21-1945, 76 y.o.   MRN: [id number]  HPI: ALLECIA Jimenez is a 76 y.o. female who returns for follow up appointment for chronic pain and medication refill. She states her pain is located in her neck and upper- lower back pain. She rates her pain 6. Her current exercise regime is walking and performing stretching exercises.  Ms. Treat Morphine equivalent is 5.00 MME. She  is also prescribed Alprazolam  by Dr. Theda Sers .We have discussed the black box warning of using opioids and benzodiazepines. I highlighted the dangers of using these drugs together and discussed the adverse events including respiratory suppression, overdose, cognitive impairment and importance of compliance with current regimen. We will continue to monitor and adjust as indicated.   Last Oral Swab was 04/09/2021, see note for details.     Pain Inventory Average Pain 5 Pain Right Now 6 My pain is stabbing and aching  In the last 24 hours, has pain interfered with the following? General activity 5 Relation with others 1 Enjoyment of life 5 What TIME of day is your pain at its worst? daytime and evening Sleep (in general) Good  Pain is worse with: bending, standing, and some activites Pain improves with: rest, heat/ice, therapy/exercise, and medication Relief from Meds: 6  Family History  Problem Relation Age of Onset   Ovarian cancer Sister    Colon cancer Sister    Heart disease Paternal Grandfather    Heart disease Maternal Grandfather    Kidney disease Brother    Kidney disease Mother    Hypertension Mother    Kidney disease Maternal Grandmother    Diabetes Paternal Grandmother    Asthma Father    Suicidality Father    Alcoholism Father    Social History   Socioeconomic History   Marital status: Divorced    Spouse name: Not on file   Number of children: 1   Years of education: Associates   Highest education  level: Not on file  Occupational History   Occupation: retired    Fish farm manager: [employer]  Tobacco Use   Smoking status: Former    Packs/day: 1.00    Years: 25.00    Pack years: 25.00    Types: Cigarettes    Quit date: 03/05/2000    Years since quitting: 21.2   Smokeless tobacco: Never  Vaping Use   Vaping Use: Never used  Substance and Sexual Activity   Alcohol use: Yes    Comment: Rare   Drug use: No    Frequency: 2.0 times per week    Types: Marijuana    Comment: previous use of marijuana for pain   Sexual activity: Not on file  Other Topics Concern   Not on file  Social History Narrative   Lives at home alone.   Right-handed.   Rare use of caffeine.   Social Determinants of Health   Financial Resource Strain: Not on file  Food Insecurity: Not on file  Transportation Needs: Not on file  Physical Activity: Not on file  Stress: Not on file  Social Connections: Not on file   Past Surgical History:  Procedure Laterality Date   [place]   KYPHOPLASTY Bilateral 03/08/2014   Procedure: Thoracic nine Kyphoplasty;  Surgeon: Ashok Pall, MD;  Location: [facility];  Service: Neurosurgery;  Laterality: Bilateral;  Thoracic nine Kyphoplasty   LIPOMA EXCISION     back   TONSILLECTOMY     WRIST GANGLION EXCISION     left   Past Surgical History:  Procedure Laterality Date   ABDOMINAL HYSTERECTOMY  1975   APPENDECTOMY  1972   FOOT SURGERY     LEFT   KYPHOPLASTY Bilateral 03/08/2014   Procedure: Thoracic nine Kyphoplasty;  Surgeon: Ashok Pall, MD;  Location: [facility];  Service: Neurosurgery;  Laterality: Bilateral;  Thoracic nine Kyphoplasty   LIPOMA EXCISION     back   TONSILLECTOMY     WRIST GANGLION EXCISION     left   Past Medical History:  Diagnosis Date   Abnormal LFTs    Allergic rhinitis    Anemia    Anxiety    Arthritis    Chronic back pain    Chronic headaches    Chronic  kidney disease, unspecified    DDD (degenerative disc disease)    GERD (gastroesophageal reflux disease)    Headache    Hypercalcemia    Hyperkalemia    Hyperlipidemia    Hypertension    KIDNEY SPECIALIST TOOK OFF BENICAR   IBS (irritable bowel syndrome)    Lupus (HCC)    Mixed connective tissue disease (HCC)    Nausea    Neuropathy    Peptic ulcer disease    Raynaud's disease    Small kidney    left   Vitamin D deficiency disease    BP 130/71   Pulse (!) 59   Ht '5\' 1"'$  (1.549 m)   Wt 106 lb 3.2 oz (48.2 kg)   SpO2 94%   BMI 20.07 kg/m   Opioid Risk Score:   Fall Risk Score:  `1  Depression screen [facility] 2/9     06/11/2021   11:35 AM 04/09/2021   11:38 AM 02/12/2021   11:14 AM 10/09/2020   11:27 AM 08/10/2020    2:39 PM 07/10/2020   11:16 AM 11/29/2019    1:19 PM  Depression screen PHQ 2/9  Decreased Interest 0 0 0 0 0 0 0  Down, Depressed, Hopeless 0 0 0 0 0 0 0  PHQ - 2 Score 0 0 0 0 0 0 0     Review of Systems  Constitutional: Negative.   HENT: Negative.    Eyes: Negative.   Respiratory: Negative.    Cardiovascular: Negative.   Gastrointestinal: Negative.   Endocrine: Negative.   Genitourinary: Negative.   Musculoskeletal:  Positive for back pain, gait problem, neck pain and neck stiffness.  Skin: Negative.   Allergic/Immunologic: Negative.   Hematological: Negative.   Psychiatric/Behavioral: Negative.        Objective:   Physical Exam Vitals and nursing note reviewed.  Constitutional:      Appearance: Normal appearance.  Cardiovascular:     Rate and Rhythm: Normal rate and regular rhythm.     Pulses: Normal pulses.     Heart sounds: Normal heart sounds.  Pulmonary:     Effort: Pulmonary effort is normal.     Breath sounds: Normal breath sounds.  Musculoskeletal:     Cervical back: Normal range of motion and neck supple.     Comments: Normal Muscle Bulk and Muscle Testing Reveals:  Upper Extremities: Decreased ROM 90 Degrees and Muscle Strength  5/5 Lower Extremities: Full ROM and Muscle Strength 5/5 Arises from Chair with ease Narrow Based Gait     Skin:    General: Skin is warm and  dry.  Neurological:     Mental Status: She is alert and oriented to person, place, and time.  Psychiatric:        Mood and Affect: Mood normal.        Behavior: Behavior normal.         Assessment & Plan:  Lumbar Spinal Stenosis: Continue HEP as Tolerated . Continue to monitor. 06/11/2021 Chronic Low Back Pain without sciatica: Continue HEP as Tolerated. Continue current medication regimen. 06/11/2021 Intercostal Neuralgia:  Continue Lyrica. Continue to Monitor.06/11/2021 Chronic Pain Syndrome: Refilled: Hydrocodone '5mg'$ /325 one tablet daily as needed for pain #30. Second script sent for the following month.  We will continue the opioid monitoring program, this consists of regular clinic visits, examinations, urine drug screen, pill counts as well as use of [place] Controlled Substance Reporting system. A 12 month History has been reviewed on the [place] Controlled Substance Reporting System on 06/11/2021. Marland KitchenCervicalgia/ Cervical Radiculitis: Continue HEP as tolerated. Continue current medication regimen. Continue to monitor. 06/11/2021 Left Shoulder Tendonitis: No Complaints Today. Continue to alternate Ice and Heat Therapy. Continue to Monitor. 06/11/2021   F/U in 2 months

## 2021-06-14 DIAGNOSIS — I129 Hypertensive chronic kidney disease with stage 1 through stage 4 chronic kidney disease, or unspecified chronic kidney disease: Secondary | ICD-10-CM | POA: Diagnosis not present

## 2021-06-14 DIAGNOSIS — N184 Chronic kidney disease, stage 4 (severe): Secondary | ICD-10-CM | POA: Diagnosis not present

## 2021-06-14 DIAGNOSIS — D631 Anemia in chronic kidney disease: Secondary | ICD-10-CM | POA: Diagnosis not present

## 2021-06-14 DIAGNOSIS — I701 Atherosclerosis of renal artery: Secondary | ICD-10-CM | POA: Diagnosis not present

## 2021-06-14 DIAGNOSIS — N183 Chronic kidney disease, stage 3 unspecified: Secondary | ICD-10-CM | POA: Diagnosis not present

## 2021-07-11 ENCOUNTER — Ambulatory Visit: Payer: PPO | Admitting: Physical Medicine and Rehabilitation

## 2021-07-17 DIAGNOSIS — I129 Hypertensive chronic kidney disease with stage 1 through stage 4 chronic kidney disease, or unspecified chronic kidney disease: Secondary | ICD-10-CM | POA: Diagnosis not present

## 2021-07-17 DIAGNOSIS — N184 Chronic kidney disease, stage 4 (severe): Secondary | ICD-10-CM | POA: Diagnosis not present

## 2021-08-05 DIAGNOSIS — I129 Hypertensive chronic kidney disease with stage 1 through stage 4 chronic kidney disease, or unspecified chronic kidney disease: Secondary | ICD-10-CM | POA: Diagnosis not present

## 2021-08-05 DIAGNOSIS — M1990 Unspecified osteoarthritis, unspecified site: Secondary | ICD-10-CM | POA: Diagnosis not present

## 2021-08-05 DIAGNOSIS — M810 Age-related osteoporosis without current pathological fracture: Secondary | ICD-10-CM | POA: Diagnosis not present

## 2021-08-05 DIAGNOSIS — N1832 Chronic kidney disease, stage 3b: Secondary | ICD-10-CM | POA: Diagnosis not present

## 2021-08-07 DIAGNOSIS — M5136 Other intervertebral disc degeneration, lumbar region: Secondary | ICD-10-CM | POA: Diagnosis not present

## 2021-08-07 DIAGNOSIS — K582 Mixed irritable bowel syndrome: Secondary | ICD-10-CM | POA: Diagnosis not present

## 2021-08-07 DIAGNOSIS — M48062 Spinal stenosis, lumbar region with neurogenic claudication: Secondary | ICD-10-CM | POA: Diagnosis not present

## 2021-08-07 DIAGNOSIS — Z23 Encounter for immunization: Secondary | ICD-10-CM | POA: Diagnosis not present

## 2021-08-07 DIAGNOSIS — N1832 Chronic kidney disease, stage 3b: Secondary | ICD-10-CM | POA: Diagnosis not present

## 2021-08-07 DIAGNOSIS — Z0000 Encounter for general adult medical examination without abnormal findings: Secondary | ICD-10-CM | POA: Diagnosis not present

## 2021-08-07 DIAGNOSIS — R519 Headache, unspecified: Secondary | ICD-10-CM | POA: Diagnosis not present

## 2021-08-07 DIAGNOSIS — M8589 Other specified disorders of bone density and structure, multiple sites: Secondary | ICD-10-CM | POA: Diagnosis not present

## 2021-08-07 DIAGNOSIS — Z862 Personal history of diseases of the blood and blood-forming organs and certain disorders involving the immune mechanism: Secondary | ICD-10-CM | POA: Diagnosis not present

## 2021-08-07 DIAGNOSIS — I129 Hypertensive chronic kidney disease with stage 1 through stage 4 chronic kidney disease, or unspecified chronic kidney disease: Secondary | ICD-10-CM | POA: Diagnosis not present

## 2021-08-13 DIAGNOSIS — D649 Anemia, unspecified: Secondary | ICD-10-CM | POA: Diagnosis not present

## 2021-08-13 DIAGNOSIS — M351 Other overlap syndromes: Secondary | ICD-10-CM | POA: Diagnosis not present

## 2021-08-13 DIAGNOSIS — M3589 Other specified systemic involvement of connective tissue: Secondary | ICD-10-CM | POA: Diagnosis not present

## 2021-08-13 DIAGNOSIS — M5136 Other intervertebral disc degeneration, lumbar region: Secondary | ICD-10-CM | POA: Diagnosis not present

## 2021-08-13 DIAGNOSIS — Z79899 Other long term (current) drug therapy: Secondary | ICD-10-CM | POA: Diagnosis not present

## 2021-08-13 DIAGNOSIS — M1990 Unspecified osteoarthritis, unspecified site: Secondary | ICD-10-CM | POA: Diagnosis not present

## 2021-08-13 DIAGNOSIS — N1832 Chronic kidney disease, stage 3b: Secondary | ICD-10-CM | POA: Diagnosis not present

## 2021-08-13 DIAGNOSIS — N1831 Chronic kidney disease, stage 3a: Secondary | ICD-10-CM | POA: Diagnosis not present

## 2021-08-13 DIAGNOSIS — M810 Age-related osteoporosis without current pathological fracture: Secondary | ICD-10-CM | POA: Diagnosis not present

## 2021-08-13 DIAGNOSIS — M549 Dorsalgia, unspecified: Secondary | ICD-10-CM | POA: Diagnosis not present

## 2021-08-15 ENCOUNTER — Encounter: Payer: Self-pay | Admitting: Registered Nurse

## 2021-08-15 ENCOUNTER — Encounter: Payer: PPO | Attending: Physical Medicine and Rehabilitation | Admitting: Registered Nurse

## 2021-08-15 VITALS — BP 120/65 | HR 58 | Ht 61.0 in | Wt 110.2 lb

## 2021-08-15 DIAGNOSIS — M5412 Radiculopathy, cervical region: Secondary | ICD-10-CM | POA: Diagnosis not present

## 2021-08-15 DIAGNOSIS — Z79899 Other long term (current) drug therapy: Secondary | ICD-10-CM | POA: Insufficient documentation

## 2021-08-15 DIAGNOSIS — Z5181 Encounter for therapeutic drug level monitoring: Secondary | ICD-10-CM | POA: Insufficient documentation

## 2021-08-15 DIAGNOSIS — G8929 Other chronic pain: Secondary | ICD-10-CM | POA: Diagnosis not present

## 2021-08-15 DIAGNOSIS — M48062 Spinal stenosis, lumbar region with neurogenic claudication: Secondary | ICD-10-CM | POA: Diagnosis not present

## 2021-08-15 DIAGNOSIS — M542 Cervicalgia: Secondary | ICD-10-CM | POA: Insufficient documentation

## 2021-08-15 DIAGNOSIS — G894 Chronic pain syndrome: Secondary | ICD-10-CM | POA: Diagnosis not present

## 2021-08-15 DIAGNOSIS — M545 Low back pain, unspecified: Secondary | ICD-10-CM | POA: Insufficient documentation

## 2021-08-15 NOTE — Progress Notes (Signed)
Subjective:    Patient ID: Holly Jimenez, female    DOB: Apr 16, 1945, 76 y.o.   MRN: [id number]  HPI: Holly Jimenez is a 76 y.o. female who returns for follow up appointment for chronic pain and medication refill. She states her pain is located in her neck radiating into her left shoulder and lower back pain. She rates her pain 6. Her current exercise regime is walking and performing stretching exercises.  Ms. Doering Morphine equivalent is 5.00 MME.  She is also prescribed Alprazolam  by Dr. Theda Sers  .We have discussed the black box warning of using opioids and benzodiazepines. I highlighted the dangers of using these drugs together and discussed the adverse events including respiratory suppression, overdose, cognitive impairment and importance of compliance with current regimen. We will continue to monitor and adjust as indicated.    Oral Swab ordered today.     Pain Inventory Average Pain 9 Pain Right Now 6 My pain is sharp and aching  In the last 24 hours, has pain interfered with the following? General activity 7 Relation with others 7 Enjoyment of life 7 What TIME of day is your pain at its worst? daytime Sleep (in general) Good  Pain is worse with: standing and some activites Pain improves with: heat/ice and medication Relief from Meds: 5  Family History  Problem Relation Age of Onset   Ovarian cancer Sister    Colon cancer Sister    Heart disease Paternal Grandfather    Heart disease Maternal Grandfather    Kidney disease Brother    Kidney disease Mother    Hypertension Mother    Kidney disease Maternal Grandmother    Diabetes Paternal Grandmother    Asthma Father    Suicidality Father    Alcoholism Father    Social History   Socioeconomic History   Marital status: Divorced    Spouse name: Not on file   Number of children: 1   Years of education: Associates   Highest education level: Not on file  Occupational History    Occupation: retired    Fish farm manager: [employer]  Tobacco Use   Smoking status: Former    Packs/day: 1.00    Years: 25.00    Total pack years: 25.00    Types: Cigarettes    Quit date: 03/05/2000    Years since quitting: 21.4   Smokeless tobacco: Never  Vaping Use   Vaping Use: Never used  Substance and Sexual Activity   Alcohol use: Yes    Comment: Rare   Drug use: No    Frequency: 2.0 times per week    Types: Marijuana    Comment: previous use of marijuana for pain   Sexual activity: Not on file  Other Topics Concern   Not on file  Social History Narrative   Lives at home alone.   Right-handed.   Rare use of caffeine.   Social Determinants of Health   Financial Resource Strain: Not on file  Food Insecurity: Not on file  Transportation Needs: Not on file  Physical Activity: Not on file  Stress: Not on file  Social Connections: Not on file   Past Surgical History:  Procedure Laterality Date   [place]   KYPHOPLASTY Bilateral 03/08/2014   Procedure: Thoracic nine Kyphoplasty;  Surgeon: Ashok Pall, MD;  Location: [place] [facility];  Service: Neurosurgery;  Laterality: Bilateral;  Thoracic nine Kyphoplasty  LIPOMA EXCISION     back   TONSILLECTOMY     WRIST GANGLION EXCISION     left   Past Surgical History:  Procedure Laterality Date   ABDOMINAL HYSTERECTOMY  1975   APPENDECTOMY  1972   FOOT SURGERY     LEFT   KYPHOPLASTY Bilateral 03/08/2014   Procedure: Thoracic nine Kyphoplasty;  Surgeon: Ashok Pall, MD;  Location: [place] [facility];  Service: Neurosurgery;  Laterality: Bilateral;  Thoracic nine Kyphoplasty   LIPOMA EXCISION     back   TONSILLECTOMY     WRIST GANGLION EXCISION     left   Past Medical History:  Diagnosis Date   Abnormal LFTs    Allergic rhinitis    Anemia    Anxiety    Arthritis    Chronic back pain    Chronic headaches    Chronic kidney disease, unspecified    DDD  (degenerative disc disease)    GERD (gastroesophageal reflux disease)    Headache    Hypercalcemia    Hyperkalemia    Hyperlipidemia    Hypertension    KIDNEY SPECIALIST TOOK OFF BENICAR   IBS (irritable bowel syndrome)    Lupus (HCC)    Mixed connective tissue disease (HCC)    Nausea    Neuropathy    Peptic ulcer disease    Raynaud's disease    Small kidney    left   Vitamin D deficiency disease    BP 120/65   Pulse (!) 58   Ht '5\' 1"'$  (1.549 m)   Wt 110 lb 3.2 oz (50 kg)   SpO2 99%   BMI 20.82 kg/m   Opioid Risk Score:   Fall Risk Score:  `1  Depression screen [facility] 2/9     08/15/2021   11:42 AM 06/11/2021   11:35 AM 04/09/2021   11:38 AM 02/12/2021   11:14 AM 10/09/2020   11:27 AM 08/10/2020    2:39 PM 07/10/2020   11:16 AM  Depression screen PHQ 2/9  Decreased Interest 0 0 0 0 0 0 0  Down, Depressed, Hopeless 0 0 0 0 0 0 0  PHQ - 2 Score 0 0 0 0 0 0 0     Review of Systems  Constitutional: Negative.   HENT: Negative.    Eyes: Negative.   Respiratory: Negative.    Cardiovascular: Negative.   Gastrointestinal: Negative.   Endocrine: Negative.   Genitourinary: Negative.   Musculoskeletal:  Positive for gait problem.  Skin: Negative.   Allergic/Immunologic: Negative.   Hematological: Negative.   Psychiatric/Behavioral: Negative.        Objective:   Physical Exam Vitals and nursing note reviewed.  Constitutional:      Appearance: Normal appearance.  Cardiovascular:     Rate and Rhythm: Normal rate and regular rhythm.     Pulses: Normal pulses.     Heart sounds: Normal heart sounds.  Pulmonary:     Effort: Pulmonary effort is normal.     Breath sounds: Normal breath sounds.  Musculoskeletal:     Cervical back: Normal range of motion and neck supple.     Comments: Normal Muscle Bulk and Muscle Testing Reveals:  Upper Extremities: Decreased ROM 45 Degrees and Muscle Strength 5/5 Left AC Joint Tenderness Lumbar Paraspinal Tenderness: L-4-L-5 Lower  Extremities  Full ROM and Muscle Strength 5/5 Arises from Table slowly Narrow Based Gait     Skin:    General: Skin is warm and dry.  Neurological:  Mental Status: She is alert and oriented to person, place, and time.  Psychiatric:        Mood and Affect: Mood normal.        Behavior: Behavior normal.         Assessment & Plan:  Lumbar Spinal Stenosis: Continue HEP as Tolerated . Continue to monitor. 08/15/2021 Chronic Low Back Pain without sciatica: Continue HEP as Tolerated. Continue current medication regimen. 08/15/2021 Intercostal Neuralgia:  No complaints today. Continue Lyrica. Continue to Monitor.08/15/2021 Chronic Pain Syndrome: Continue  Hydrocodone '5mg'$ /325 one tablet daily as needed for pain #30.  We will continue the opioid monitoring program, this consists of regular clinic visits, examinations, urine drug screen, pill counts as well as use of [place] Controlled Substance Reporting system. A 12 month History has been reviewed on the [place] Controlled Substance Reporting System on 08/15/2021. Marland KitchenCervicalgia/ Cervical Radiculitis: Continue Lyrica. Continue HEP as tolerated. Continue current medication regimen. Continue to monitor. 08/15/2021 Left Shoulder Tendonitis:  Continue to alternate Ice and Heat Therapy. Continue to Monitor. 08/15/2021   F/U in 2 months

## 2021-08-21 LAB — DRUG TOX MONITOR 1 W/CONF, ORAL FLD

## 2021-08-21 LAB — DRUG TOX ALC METAB W/CON, ORAL FLD: Alcohol Metabolite: NEGATIVE ng/mL (ref <25)

## 2021-08-27 ENCOUNTER — Telehealth: Payer: Self-pay | Admitting: *Deleted

## 2021-08-27 NOTE — Telephone Encounter (Signed)
Oral swab drug screen was negative for medication though she reported taking the pm before the test. This is the second negative oral swab she has had. There was a positive swab in 12/2020 but it was a different swab that is currently in use. I would recommend she no longer be tested using the oral swab method and repeat next appt with urine drug screen.

## 2021-09-16 ENCOUNTER — Other Ambulatory Visit: Payer: Self-pay | Admitting: Registered Nurse

## 2021-09-16 NOTE — Telephone Encounter (Signed)
PMP was Reviewed.  Hydrocodone e-scribed today.  Call placed to Holly Jimenez regarding the above, she verbalizes understanding.

## 2021-09-20 DIAGNOSIS — N179 Acute kidney failure, unspecified: Secondary | ICD-10-CM | POA: Diagnosis not present

## 2021-09-20 DIAGNOSIS — I129 Hypertensive chronic kidney disease with stage 1 through stage 4 chronic kidney disease, or unspecified chronic kidney disease: Secondary | ICD-10-CM | POA: Diagnosis not present

## 2021-09-20 DIAGNOSIS — D631 Anemia in chronic kidney disease: Secondary | ICD-10-CM | POA: Diagnosis not present

## 2021-09-20 DIAGNOSIS — E875 Hyperkalemia: Secondary | ICD-10-CM | POA: Diagnosis not present

## 2021-09-20 DIAGNOSIS — N184 Chronic kidney disease, stage 4 (severe): Secondary | ICD-10-CM | POA: Diagnosis not present

## 2021-09-20 DIAGNOSIS — N189 Chronic kidney disease, unspecified: Secondary | ICD-10-CM | POA: Diagnosis not present

## 2021-10-22 ENCOUNTER — Encounter: Payer: Self-pay | Admitting: Physical Medicine and Rehabilitation

## 2021-10-22 ENCOUNTER — Encounter: Payer: PPO | Attending: Physical Medicine and Rehabilitation | Admitting: Physical Medicine and Rehabilitation

## 2021-10-22 VITALS — BP 133/69 | HR 84 | Ht 61.0 in | Wt 110.0 lb

## 2021-10-22 DIAGNOSIS — Z79899 Other long term (current) drug therapy: Secondary | ICD-10-CM | POA: Diagnosis not present

## 2021-10-22 DIAGNOSIS — M545 Low back pain, unspecified: Secondary | ICD-10-CM | POA: Insufficient documentation

## 2021-10-22 DIAGNOSIS — Z5181 Encounter for therapeutic drug level monitoring: Secondary | ICD-10-CM | POA: Insufficient documentation

## 2021-10-22 DIAGNOSIS — G588 Other specified mononeuropathies: Secondary | ICD-10-CM | POA: Insufficient documentation

## 2021-10-22 DIAGNOSIS — M8000XA Age-related osteoporosis with current pathological fracture, unspecified site, initial encounter for fracture: Secondary | ICD-10-CM | POA: Insufficient documentation

## 2021-10-22 DIAGNOSIS — G894 Chronic pain syndrome: Secondary | ICD-10-CM | POA: Diagnosis not present

## 2021-10-22 MED ORDER — HYDROCODONE-ACETAMINOPHEN 5-325 MG PO TABS: 1.0000 | ORAL_TABLET | Freq: Every day | ORAL | 0 refills | Status: DC | PRN

## 2021-10-22 NOTE — Progress Notes (Signed)
Subjective:    Patient ID: Holly Jimenez, female    DOB: 12/28/1945, 76 y.o.   MRN: [id number]  HPI  Mrs. Karnes presents for follow-up of of chronic pain.   1) Intercostal neuralgia: -Pain is better.  -The cream and Gabapentin did help.  -She was resting a lot more and that has also helped.  -She is now getting in an out of bed on her own.  -Pain has improved significantly with SPRINT PNS.   2) Lower back pain: -has been bothering her more recently.  -improved with injections, feeling much better  3) Abdominal pain: -left side radiating to right.  -XR with her PCP done today  4) Incontinence of bowel: -resolved   She had a fall a few weeks ago and had a bandlike radiation of pain under her right breast. This continues now.   When she has a BM she is sometimes does not make it to the bathroom on time. She sometimes feels constipated. Grape juice with fiber has helped.   She got the back brace and it did not help.  Her son accompanies her today  Her BP is elevated this visit, though not as elevated as last visit. She as been unable to follow with her PCP to get her blood pressure medication.   She is sleeping better at night- stopped amitriptyline as it was giving her crazy dreams.   MRI thoracic and lumbar spine reviewed and shows:  This MRI of the thoracic spine without contrast shows the following: 1.   The spinal cord appears normal. 2.   Acute compression fractures of the T8, T11 and L2 vertebral bodies. 3.   Chronic compression fracture of the T9 vertebral body with prior evidence of vertebral body augmentation. 4.   Multilevel degenerative changes as detailed above that does not lead to nerve root compression or spinal stenosis.  Her walking has improved since this happens but she is still limited. She walks around the house.   She has a diagnosis of osteoporosis. She is not on any medications for osteoporosis but has a referral to  rheumatology.   Her son accompanies her today. He lives in [place] and is not with her every day.   She wants to move when she has a good day.   She has been having some decreased appetite.   Pain Inventory Average Pain 8 Pain Right Now 5 My pain is intermittent, sharp, stabbing, and aching  In the last 24 hours, has pain interfered with the following? General activity 9 Relation with others 9 Enjoyment of life 9 What TIME of day is your pain at its worst? daytime and evening Sleep (in general) Good  Pain is worse with: bending, standing, and some activites Pain improves with: rest, heat/ice, and medication Relief from Meds:  FAIR      Family History  Problem Relation Age of Onset   Ovarian cancer Sister    Colon cancer Sister    Heart disease Paternal Grandfather    Heart disease Maternal Grandfather    Kidney disease Brother    Kidney disease Mother    Hypertension Mother    Kidney disease Maternal Grandmother    Diabetes Paternal Grandmother    Asthma Father    Suicidality Father    Alcoholism Father    Social History   Socioeconomic History   Marital status: Divorced    Spouse name: Not on file   Number of children: 1   Years of education: Insurance account manager  Highest education level: Not on file  Occupational History   Occupation: retired    Fish farm manager: [employer]  Tobacco Use   Smoking status: Former    Packs/day: 1.00    Years: 25.00    Total pack years: 25.00    Types: Cigarettes    Quit date: 03/05/2000    Years since quitting: 21.6   Smokeless tobacco: Never  Vaping Use   Vaping Use: Never used  Substance and Sexual Activity   Alcohol use: Yes    Comment: Rare   Drug use: No    Frequency: 2.0 times per week    Types: Marijuana    Comment: previous use of marijuana for pain   Sexual activity: Not on file  Other Topics Concern   Not on file  Social History Narrative   Lives at home alone.   Right-handed.   Rare use of caffeine.    Social Determinants of Health   Financial Resource Strain: Not on file  Food Insecurity: Not on file  Transportation Needs: Not on file  Physical Activity: Not on file  Stress: Not on file  Social Connections: Not on file   Past Surgical History:  Procedure Laterality Date   [place]   KYPHOPLASTY Bilateral 03/08/2014   Procedure: Thoracic nine Kyphoplasty;  Surgeon: Ashok Pall, MD;  Location: [facility];  Service: Neurosurgery;  Laterality: Bilateral;  Thoracic nine Kyphoplasty   LIPOMA EXCISION     back   TONSILLECTOMY     WRIST GANGLION EXCISION     left   Past Medical History:  Diagnosis Date   Abnormal LFTs    Allergic rhinitis    Anemia    Anxiety    Arthritis    Chronic back pain    Chronic headaches    Chronic kidney disease, unspecified    DDD (degenerative disc disease)    GERD (gastroesophageal reflux disease)    Headache    Hypercalcemia    Hyperkalemia    Hyperlipidemia    Hypertension    KIDNEY SPECIALIST TOOK OFF BENICAR   IBS (irritable bowel syndrome)    Lupus (HCC)    Mixed connective tissue disease (HCC)    Nausea    Neuropathy    Peptic ulcer disease    Raynaud's disease    Small kidney    left   Vitamin D deficiency disease    BP 133/69   Pulse 84   Ht '5\' 1"'$  (1.549 m)   Wt 110 lb (49.9 kg)   SpO2 97%   BMI 20.78 kg/m   Opioid Risk Score:   Fall Risk Score:  `1  Depression screen [facility] 2/9     10/22/2021   11:16 AM 08/15/2021   11:42 AM 06/11/2021   11:35 AM 04/09/2021   11:38 AM 02/12/2021   11:14 AM 10/09/2020   11:27 AM 08/10/2020    2:39 PM  Depression screen PHQ 2/9  Decreased Interest 0 0 0 0 0 0 0  Down, Depressed, Hopeless 0 0 0 0 0 0 0  PHQ - 2 Score 0 0 0 0 0 0 0    Review of Systems  Constitutional: Negative.   HENT: Negative.    Eyes: Negative.   Respiratory: Negative.    Gastrointestinal: Negative.   Endocrine: Negative.   Genitourinary:  Negative.   Musculoskeletal:  Positive for back pain and gait problem.  Spasms   Skin: Negative.   Allergic/Immunologic: Negative.   Neurological:  Positive for tremors and weakness.  Hematological: Negative.   Psychiatric/Behavioral:  The patient is nervous/anxious.   All other systems reviewed and are negative.      Objective:   Physical Exam Gen: no distress, normal appearing, BMI 21.46 HEENT: oral mucosa pink and moist, NCAT Cardio: Reg rate Chest: normal effort, normal rate of breathing Abd: soft, non-distended Ext: no edema Psych: pleasant, normal affect Skin: intact Neuro: Alert and oriented, hypophonic voice Musculoskeletal: Tender to palpation over multiple compression fractures in lumbar and thoracic spine. Kyphotic posture -SPRINT PNS site c/d/i with small scab- no bleeding with removal of device.  Psych: pleasant, normal affect     Assessment & Plan:  Mrs. Barrie is a 76 year old woman who presents to establish care for back pain.  Lower back pain: -MRI reviewed with patient and her son and shows evidence of multiple compression fractures.  -TLSO ordered for additional support but this did not help. Advised that this could result in weakened paraspinals but at this time it will be beneficial in providing increased support and allowing relaxation of her paraspinals to lessen spasm. -Heat packs to paraspinal muscles for relaxation -Diclofenac gel QID prn ordered as well as lidocaine patch for pain relief. -Advised that pain is way for body to let her know that something is wrong and to take it easy- try to be as active as possible, but if feeling pain take time to rest.  -Continue Gabapentin to '200mg'$  TID since well tolerated, helping, but pain persists.  -lidocaine patch prescribed.   -refilled Norco 5 MME -provided with pain relief journal  Osteoporosis: -Discussed medications that could help and their side effects. Advised son and patient to let me  know if they would like to consider these. -Discussed the importance of weight bearing activities to preserve bone mass -Discussed the importance of eating foods rich in vitamin D and calcium and provided a hand out with some of these foods.   Constipation: discussed high fiber diet, laxatives if needed.  -Provided list of following foods that help with constipation and highlighted a few: 1) prunes- contain high amounts of fiber.  2) apples- has a form of dietary fiber called pectin that accelerates stool movement and increases beneficial gut bacteria 3) pears- in addition to fiber, also high in fructose and sorbitol which have laxative effect 4) figs- contain an enzyme ficin which helps to speed colonic transit 5) kiwis- contain an enzyme actinidin that improves gut motility and reduces constipation 6) oranges- rich in pectin (like apples) 7) grapefruits- contain a flavanol naringenin which has a laxative effect 8) vegetables- rich in fiber and also great sources of folate, vitamin C, and K 9) artichoke- high in inulin, prebiotic great for the microbiome 10) chicory- increases stool frequency and softness (can be added to coffee) 11) rhubarb- laxative effect 12) sweet potato- high fiber 13) beans, peas, and lentils- contain both soluble and insoluble fiber 14) chia seeds- improves intestinal health and gut flora 15) flaxseeds- laxative effect 16) whole grain rye bread- high in fiber 17) oat bran- high in soluble and insoluble fiber 18) kefir- softens stools -recommended to try at least one of these foods every day.  -drink 6-8 glasses of water per day -walk regularly, especially after meals.       Insomnia: -Try to go outside near sunrise -Get exercise during the day.  -Discussed good sleep hygiene: turning off all devices  an hour before bedtime.  -Amitriptyline caused nightmares so she stopped  HTN: Excellent control today. Continue amlodipine 2.'5mg'$  daily. Check BP at home  daily and bring to f/u appointment. Advised high potassium foods.  Intercostal neuralgia: -sprint PNS removed without any complications -it has greatly helped! She currently has no complaints.

## 2021-10-22 NOTE — Progress Notes (Signed)
Subjective:    Patient ID: Holly Jimenez, female    DOB: 01/03/1946, 76 y.o.   MRN: [id number]  HPI  Holly Jimenez presents for f/u of of chronic pain.   1) Intercostal neuralgia: -Pain is better.  -last couple of days her pain has been good, but other than that she has left sided spasms. -The cream and Gabapentin did help.  -She was resting a lot more and that has also helped.  -She is now getting in an out of bed on her own.  -Pain has improved significantly with SPRINT PNS.   2) Lower back pain: -has been bothering her more recently.  -improved with injections, feeling much better -she needs a refill of her hydrocodone- she uses one tablet per day  3) Abdominal pain: -left side radiating to right.  -XR with her PCP done today  4) Incontinence of bowel: -resolved   5) Muscle spasms: -she would like to try a medication for spasms -sleeping pretty well.  -she would be interested in trying extracoporeal shockwave therapy  She had a fall a few weeks ago and had a bandlike radiation of pain under her right breast. This continues now.   When she has a BM she is sometimes does not make it to the bathroom on time. She sometimes feels constipated. Grape juice with fiber has helped.   She got the back brace and it did not help.  Her son accompanies her today  Her BP is elevated this visit, though not as elevated as last visit. She as been unable to follow with her PCP to get her blood pressure medication.   She is sleeping better at night- stopped amitriptyline as it was giving her crazy dreams.   MRI thoracic and lumbar spine reviewed and shows:  This MRI of the thoracic spine without contrast shows the following: 1.   The spinal cord appears normal. 2.   Acute compression fractures of the T8, T11 and L2 vertebral bodies. 3.   Chronic compression fracture of the T9 vertebral body with prior evidence of vertebral body augmentation. 4.   Multilevel  degenerative changes as detailed above that does not lead to nerve root compression or spinal stenosis.  Her walking has improved since this happens but she is still limited. She walks around the house.   She has a diagnosis of osteoporosis. She is not on any medications for osteoporosis but has a referral to rheumatology.   Her son accompanies her today. He lives in [place] and is not with her every day.   She wants to move when she has a good day.   She has been having some decreased appetite.   Pain Inventory Average Pain 8 Pain Right Now 9 My pain is intermittent, sharp, and aching  In the last 24 hours, has pain interfered with the following? General activity 10 Relation with others 10 Enjoyment of life 10 What TIME of day is your pain at its worst? daytime Sleep (in general) NA  Pain is worse with: walking, bending, and standing Pain improves with: rest, heat/ice, and medication Relief from Meds: 6      Family History  Problem Relation Age of Onset   Ovarian cancer Sister    Colon cancer Sister    Heart disease Paternal Grandfather    Heart disease Maternal Grandfather    Kidney disease Brother    Kidney disease Mother    Hypertension Mother    Kidney disease Maternal Grandmother  Diabetes Paternal Grandmother    Asthma Father    Suicidality Father    Alcoholism Father    Social History   Socioeconomic History   Marital status: Divorced    Spouse name: Not on file   Number of children: 1   Years of education: Associates   Highest education level: Not on file  Occupational History   Occupation: retired    Fish farm manager: [employer]  Tobacco Use   Smoking status: Former    Packs/day: 1.00    Years: 25.00    Total pack years: 25.00    Types: Cigarettes    Quit date: 03/05/2000    Years since quitting: 21.6   Smokeless tobacco: Never  Vaping Use   Vaping Use: Never used  Substance and Sexual Activity   Alcohol use: Yes    Comment: Rare    Drug use: No    Frequency: 2.0 times per week    Types: Marijuana    Comment: previous use of marijuana for pain   Sexual activity: Not on file  Other Topics Concern   Not on file  Social History Narrative   Lives at home alone.   Right-handed.   Rare use of caffeine.   Social Determinants of Health   Financial Resource Strain: Not on file  Food Insecurity: Not on file  Transportation Needs: Not on file  Physical Activity: Not on file  Stress: Not on file  Social Connections: Not on file   Past Surgical History:  Procedure Laterality Date   [place]   KYPHOPLASTY Bilateral 03/08/2014   Procedure: Thoracic nine Kyphoplasty;  Surgeon: Ashok Pall, MD;  Location: [facility];  Service: Neurosurgery;  Laterality: Bilateral;  Thoracic nine Kyphoplasty   LIPOMA EXCISION     back   TONSILLECTOMY     WRIST GANGLION EXCISION     left   Past Medical History:  Diagnosis Date   Abnormal LFTs    Allergic rhinitis    Anemia    Anxiety    Arthritis    Chronic back pain    Chronic headaches    Chronic kidney disease, unspecified    DDD (degenerative disc disease)    GERD (gastroesophageal reflux disease)    Headache    Hypercalcemia    Hyperkalemia    Hyperlipidemia    Hypertension    KIDNEY SPECIALIST TOOK OFF BENICAR   IBS (irritable bowel syndrome)    Lupus (HCC)    Mixed connective tissue disease (HCC)    Nausea    Neuropathy    Peptic ulcer disease    Raynaud's disease    Small kidney    left   Vitamin D deficiency disease    BP 133/69   Pulse 84   Ht '5\' 1"'$  (1.549 m)   Wt 110 lb (49.9 kg)   SpO2 97%   BMI 20.78 kg/m   Opioid Risk Score:   Fall Risk Score:  `1  Depression screen [facility] 2/9     10/22/2021   11:16 AM 08/15/2021   11:42 AM 06/11/2021   11:35 AM 04/09/2021   11:38 AM 02/12/2021   11:14 AM 10/09/2020   11:27 AM 08/10/2020    2:39 PM  Depression screen PHQ 2/9  Decreased Interest  0 0 0 0 0 0 0  Down, Depressed, Hopeless 0 0 0 0 0 0 0  PHQ - 2 Score 0  0 0 0 0 0 0    Review of Systems  Constitutional: Negative.   HENT: Negative.    Eyes: Negative.   Respiratory: Negative.    Gastrointestinal: Negative.   Endocrine: Negative.   Genitourinary: Negative.   Musculoskeletal:  Positive for back pain and gait problem.       Spasms   Skin: Negative.   Allergic/Immunologic: Negative.   Neurological:  Positive for tremors and weakness.  Hematological: Negative.   Psychiatric/Behavioral:  The patient is nervous/anxious.   All other systems reviewed and are negative.     Objective:   Physical Exam Gen: no distress, normal appearing, BMI 20.78 HEENT: oral mucosa pink and moist, NCAT Cardio: Reg rate Chest: normal effort, normal rate of breathing Abd: soft, non-distended Ext: no edema Psych: pleasant, normal affect Skin: intact Neuro: Alert and oriented, hypophonic voice Musculoskeletal: Tender to palpation over multiple compression fractures in lumbar and thoracic spine. Cervical myofascial trigger points present Kyphotic posture -SPRINT PNS site c/d/i with small scab- no bleeding with removal of device.  Psych: pleasant, normal affect     Assessment & Plan:  Mrs. Siegmann is a 76 year old woman who presents to establish care for back pain.  Lower back pain: -MRI reviewed with patient and her son and shows evidence of multiple compression fractures.  -TLSO ordered for additional support but this did not help. Advised that this could result in weakened paraspinals but at this time it will be beneficial in providing increased support and allowing relaxation of her paraspinals to lessen spasm. -Heat packs to paraspinal muscles for relaxation -Diclofenac gel QID prn ordered as well as lidocaine patch for pain relief. -Advised that pain is way for body to let her know that something is wrong and to take it easy- try to be as active as possible, but if  feeling pain take time to rest.  -Continue Gabapentin to '200mg'$  TID since well tolerated, helping, but pain persists.  -lidocaine patch prescribed.   -refilled Norco 5 MME -Provided with a pain relief journal and discussed that it contains foods and lifestyle tips to naturally help to improve pain. Discussed that these lifestyle strategies are also very good for health unlike some medications which can have negative side effects. Discussed that the act of keeping a journal can be therapeutic and helpful to realize patterns what helps to trigger and alleviate pain.    Low back pain 2/2 Osteoporosis: -Discussed medications that could help and their side effects. Advised son and patient to let me know if they would like to consider these. -Discussed the importance of weight bearing activities to preserve bone mass -Discussed the importance of eating foods rich in vitamin D and calcium and provided a hand out with some of these foods.  -Discussed extracorporeal shockwave therapy as a modality for treatment. Discussed that the device looks and feels like a massage gun and I would move it over the area of pain for about 10 minutes. The device releases sound waves to the area of pain and helps to improve blood flow and circulation to improve the healing process. Discuss that this initially induces inflammation and can sometimes cause short-term increase in pain. Discussed that we typically do three weekly treatments, but sometimes up to 6 if needed, and after 6 weeks long term benefits can sometimes be achieved. Discussed that this is an FDA approved device, but not covered by insurance and would cost $60 per session. Will scheduled patient for 6 consecutive appointments and  can cancel latter three if benefits are achieved after first three sessions.    Constipation: discussed high fiber diet, laxatives if needed.  -Provided list of following foods that help with constipation and highlighted a few: 1) prunes-  contain high amounts of fiber.  2) apples- has a form of dietary fiber called pectin that accelerates stool movement and increases beneficial gut bacteria 3) pears- in addition to fiber, also high in fructose and sorbitol which have laxative effect 4) figs- contain an enzyme ficin which helps to speed colonic transit 5) kiwis- contain an enzyme actinidin that improves gut motility and reduces constipation 6) oranges- rich in pectin (like apples) 7) grapefruits- contain a flavanol naringenin which has a laxative effect 8) vegetables- rich in fiber and also great sources of folate, vitamin C, and K 9) artichoke- high in inulin, prebiotic great for the microbiome 10) chicory- increases stool frequency and softness (can be added to coffee) 11) rhubarb- laxative effect 12) sweet potato- high fiber 13) beans, peas, and lentils- contain both soluble and insoluble fiber 14) chia seeds- improves intestinal health and gut flora 15) flaxseeds- laxative effect 16) whole grain rye bread- high in fiber 17) oat bran- high in soluble and insoluble fiber 18) kefir- softens stools -recommended to try at least one of these foods every day.  -drink 6-8 glasses of water per day -walk regularly, especially after meals.       Insomnia: -Try to go outside near sunrise -Get exercise during the day.  -Discussed good sleep hygiene: turning off all devices an hour before bedtime.  -Amitriptyline caused nightmares so she stopped  HTN: Excellent control today. Continue amlodipine 2.'5mg'$  daily. Check BP at home daily and bring to f/u appointment. Advised high potassium foods.  Intercostal neuralgia: -sprint PNS removed without any complications -it has greatly helped! Continue Norco PRN as above

## 2021-10-25 LAB — TOXASSURE SELECT,+ANTIDEPR,UR

## 2021-10-28 ENCOUNTER — Telehealth: Payer: Self-pay | Admitting: *Deleted

## 2021-10-28 NOTE — Telephone Encounter (Signed)
Urine drug screen for this encounter is consistent for prescribed medication 

## 2021-11-05 DIAGNOSIS — I129 Hypertensive chronic kidney disease with stage 1 through stage 4 chronic kidney disease, or unspecified chronic kidney disease: Secondary | ICD-10-CM | POA: Diagnosis not present

## 2021-11-05 DIAGNOSIS — M810 Age-related osteoporosis without current pathological fracture: Secondary | ICD-10-CM | POA: Diagnosis not present

## 2021-11-12 DIAGNOSIS — M549 Dorsalgia, unspecified: Secondary | ICD-10-CM | POA: Diagnosis not present

## 2021-11-12 DIAGNOSIS — Z79899 Other long term (current) drug therapy: Secondary | ICD-10-CM | POA: Diagnosis not present

## 2021-11-12 DIAGNOSIS — M3589 Other specified systemic involvement of connective tissue: Secondary | ICD-10-CM | POA: Diagnosis not present

## 2021-11-12 DIAGNOSIS — N1831 Chronic kidney disease, stage 3a: Secondary | ICD-10-CM | POA: Diagnosis not present

## 2021-11-12 DIAGNOSIS — M1990 Unspecified osteoarthritis, unspecified site: Secondary | ICD-10-CM | POA: Diagnosis not present

## 2021-11-12 DIAGNOSIS — D649 Anemia, unspecified: Secondary | ICD-10-CM | POA: Diagnosis not present

## 2021-11-12 DIAGNOSIS — M810 Age-related osteoporosis without current pathological fracture: Secondary | ICD-10-CM | POA: Diagnosis not present

## 2021-11-12 DIAGNOSIS — M5136 Other intervertebral disc degeneration, lumbar region: Secondary | ICD-10-CM | POA: Diagnosis not present

## 2021-11-14 ENCOUNTER — Encounter: Payer: Self-pay | Admitting: Registered Nurse

## 2021-11-14 ENCOUNTER — Encounter: Payer: PPO | Attending: Physical Medicine and Rehabilitation | Admitting: Registered Nurse

## 2021-11-14 VITALS — BP 118/67 | HR 61 | Ht 61.0 in | Wt 109.0 lb

## 2021-11-14 DIAGNOSIS — Z5181 Encounter for therapeutic drug level monitoring: Secondary | ICD-10-CM | POA: Insufficient documentation

## 2021-11-14 DIAGNOSIS — M48062 Spinal stenosis, lumbar region with neurogenic claudication: Secondary | ICD-10-CM | POA: Insufficient documentation

## 2021-11-14 DIAGNOSIS — G894 Chronic pain syndrome: Secondary | ICD-10-CM | POA: Insufficient documentation

## 2021-11-14 DIAGNOSIS — Z79899 Other long term (current) drug therapy: Secondary | ICD-10-CM | POA: Insufficient documentation

## 2021-11-14 MED ORDER — HYDROCODONE-ACETAMINOPHEN 5-325 MG PO TABS: 1.0000 | ORAL_TABLET | Freq: Every day | ORAL | 0 refills | Status: DC | PRN

## 2021-11-14 NOTE — Progress Notes (Signed)
Subjective:    Patient ID: Holly Jimenez, female    DOB: March 04, 1945, 76 y.o.   MRN: [id number]  HPI: Holly Jimenez is a 76 y.o. female who returns for follow up appointment for chronic pain and medication refill. She states her pain is located in her lower back. She rates her pain 7. Her current exercise regime is walking and performing stretching exercises.  Ms. Garofano Morphine equivalent is 5.00 MME.She is also prescribed Alprazolam  by Dr. Theda Sers .We have discussed the black box warning of using opioids and benzodiazepines. I highlighted the dangers of using these drugs together and discussed the adverse events including respiratory suppression, overdose, cognitive impairment and importance of compliance with current regimen. We will continue to monitor and adjust as indicated.  she is being closely monitored and under the care of her psychiatrist.   Last UDS was Performed on 10/22/2021, it was consistent.    Pain Inventory Average Pain 10 Pain Right Now 7 My pain is sharp, stabbing, and aching  In the last 24 hours, has pain interfered with the following? General activity 10 Relation with others 10 Enjoyment of life 10 What TIME of day is your pain at its worst? daytime and evening Sleep (in general) Good  Pain is worse with: walking, bending, standing, and some activites Pain improves with: rest, heat/ice, and medication Relief from Meds: 5  Family History  Problem Relation Age of Onset   Ovarian cancer Sister    Colon cancer Sister    Heart disease Paternal Grandfather    Heart disease Maternal Grandfather    Kidney disease Brother    Kidney disease Mother    Hypertension Mother    Kidney disease Maternal Grandmother    Diabetes Paternal Grandmother    Asthma Father    Suicidality Father    Alcoholism Father    Social History   Socioeconomic History   Marital status: Divorced    Spouse name: Not on file   Number of children: 1    Years of education: Associates   Highest education level: Not on file  Occupational History   Occupation: retired    Fish farm manager: [employer]  Tobacco Use   Smoking status: Former    Packs/day: 1.00    Years: 25.00    Total pack years: 25.00    Types: Cigarettes    Quit date: 03/05/2000    Years since quitting: 21.7   Smokeless tobacco: Never  Vaping Use   Vaping Use: Never used  Substance and Sexual Activity   Alcohol use: Yes    Comment: Rare   Drug use: No    Frequency: 2.0 times per week    Types: Marijuana    Comment: previous use of marijuana for pain   Sexual activity: Not on file  Other Topics Concern   Not on file  Social History Narrative   Lives at home alone.   Right-handed.   Rare use of caffeine.   Social Determinants of Health   Financial Resource Strain: Not on file  Food Insecurity: Not on file  Transportation Needs: Not on file  Physical Activity: Not on file  Stress: Not on file  Social Connections: Not on file   Past Surgical History:  Procedure Laterality Date   [place]   KYPHOPLASTY Bilateral 03/08/2014   Procedure: Thoracic nine Kyphoplasty;  Surgeon: Ashok Pall, MD;  Location: [facility];  Service: Neurosurgery;  Laterality: Bilateral;  Thoracic nine Kyphoplasty   LIPOMA EXCISION     back   TONSILLECTOMY     WRIST GANGLION EXCISION     left   Past Surgical History:  Procedure Laterality Date   ABDOMINAL HYSTERECTOMY  1975   APPENDECTOMY  1972   FOOT SURGERY     LEFT   KYPHOPLASTY Bilateral 03/08/2014   Procedure: Thoracic nine Kyphoplasty;  Surgeon: Ashok Pall, MD;  Location: [place] [facility];  Service: Neurosurgery;  Laterality: Bilateral;  Thoracic nine Kyphoplasty   LIPOMA EXCISION     back   TONSILLECTOMY     WRIST GANGLION EXCISION     left   Past Medical History:  Diagnosis Date   Abnormal LFTs    Allergic rhinitis    Anemia    Anxiety     Arthritis    Chronic back pain    Chronic headaches    Chronic kidney disease, unspecified    DDD (degenerative disc disease)    GERD (gastroesophageal reflux disease)    Headache    Hypercalcemia    Hyperkalemia    Hyperlipidemia    Hypertension    KIDNEY SPECIALIST TOOK OFF BENICAR   IBS (irritable bowel syndrome)    Lupus (HCC)    Mixed connective tissue disease (HCC)    Nausea    Neuropathy    Peptic ulcer disease    Raynaud's disease    Small kidney    left   Vitamin D deficiency disease    BP 118/67   Pulse 61   Ht '5\' 1"'$  (1.549 m)   Wt 109 lb (49.4 kg)   SpO2 98%   BMI 20.60 kg/m   Opioid Risk Score:   Fall Risk Score:  `1  Depression screen [facility] 2/9     10/22/2021   11:16 AM 08/15/2021   11:42 AM 06/11/2021   11:35 AM 04/09/2021   11:38 AM 02/12/2021   11:14 AM 10/09/2020   11:27 AM 08/10/2020    2:39 PM  Depression screen PHQ 2/9  Decreased Interest 0 0 0 0 0 0 0  Down, Depressed, Hopeless 0 0 0 0 0 0 0  PHQ - 2 Score 0 0 0 0 0 0 0       Review of Systems  Musculoskeletal:  Positive for neck pain.  All other systems reviewed and are negative.     Objective:   Physical Exam Vitals and nursing note reviewed.  Constitutional:      Appearance: Normal appearance.  Cardiovascular:     Rate and Rhythm: Normal rate and regular rhythm.     Pulses: Normal pulses.     Heart sounds: Normal heart sounds.  Pulmonary:     Effort: Pulmonary effort is normal.     Breath sounds: Normal breath sounds.  Musculoskeletal:     Cervical back: Normal range of motion and neck supple.     Comments: Normal Muscle Bulk and Muscle Testing Reveals:  Upper Extremities: Full ROM and Muscle Strength 5/5  Lumbar Paraspinal Tenderness: L-4-L-5 Lower Extremities : Full ROM and Muscle Strength 5/5 Arises from Table  slowly using cane for support Antalgic Gait     Skin:    General: Skin is warm and dry.  Neurological:     Mental Status: She is alert and oriented to person,  place, and time.  Psychiatric:        Mood and Affect: Mood normal.  Behavior: Behavior normal.         Assessment & Plan:  Lumbar Spinal Stenosis: Continue HEP as Tolerated . Continue to monitor. 11/14/2021 Chronic Low Back Pain without sciatica: She is scheduled for Shock Wave therapy  with Dr Ranell Patrick. Continue HEP as Tolerated. Continue current medication regimen. 11/14/2021 Intercostal Neuralgia:  No complaints today. Continue Lyrica. Continue to Monitor.11/14/2021 Chronic Pain Syndrome: Refilled Hydrocodone '5mg'$ /325 one tablet daily as needed for pain #30. Second script sent for the following month.  We will continue the opioid monitoring program, this consists of regular clinic visits, examinations, urine drug screen, pill counts as well as use of [place] Controlled Substance Reporting system. A 12 month History has been reviewed on the [place] Controlled Substance Reporting System on 11/14/2021. Marland KitchenCervicalgia/ Cervical Radiculitis: Continue Lyrica. Continue HEP as tolerated. Continue current medication regimen. Continue to monitor. 11/14/2021 Left Shoulder Tendonitis:  Continue to alternate Ice and Heat Therapy. Continue to Monitor. 11/14/2021   F/U in 2 months: This was discussed with Dr Ranell Patrick she agrees with every 2 month visits.

## 2021-12-20 DIAGNOSIS — I129 Hypertensive chronic kidney disease with stage 1 through stage 4 chronic kidney disease, or unspecified chronic kidney disease: Secondary | ICD-10-CM | POA: Diagnosis not present

## 2021-12-20 DIAGNOSIS — D631 Anemia in chronic kidney disease: Secondary | ICD-10-CM | POA: Diagnosis not present

## 2021-12-20 DIAGNOSIS — E875 Hyperkalemia: Secondary | ICD-10-CM | POA: Diagnosis not present

## 2021-12-20 DIAGNOSIS — N179 Acute kidney failure, unspecified: Secondary | ICD-10-CM | POA: Diagnosis not present

## 2021-12-20 DIAGNOSIS — N184 Chronic kidney disease, stage 4 (severe): Secondary | ICD-10-CM | POA: Diagnosis not present

## 2021-12-20 DIAGNOSIS — N2581 Secondary hyperparathyroidism of renal origin: Secondary | ICD-10-CM | POA: Diagnosis not present

## 2021-12-20 DIAGNOSIS — N189 Chronic kidney disease, unspecified: Secondary | ICD-10-CM | POA: Diagnosis not present

## 2021-12-23 DIAGNOSIS — N184 Chronic kidney disease, stage 4 (severe): Secondary | ICD-10-CM | POA: Diagnosis not present

## 2022-01-05 DIAGNOSIS — I129 Hypertensive chronic kidney disease with stage 1 through stage 4 chronic kidney disease, or unspecified chronic kidney disease: Secondary | ICD-10-CM | POA: Diagnosis not present

## 2022-01-05 DIAGNOSIS — M810 Age-related osteoporosis without current pathological fracture: Secondary | ICD-10-CM | POA: Diagnosis not present

## 2022-01-21 ENCOUNTER — Encounter: Payer: PPO | Attending: Physical Medicine and Rehabilitation | Admitting: Physical Medicine and Rehabilitation

## 2022-01-21 ENCOUNTER — Encounter: Payer: Self-pay | Admitting: Physical Medicine and Rehabilitation

## 2022-01-21 VITALS — BP 124/71 | HR 71 | Temp 98.3°F | Ht 61.0 in | Wt 107.0 lb

## 2022-01-21 DIAGNOSIS — M8000XA Age-related osteoporosis with current pathological fracture, unspecified site, initial encounter for fracture: Secondary | ICD-10-CM | POA: Insufficient documentation

## 2022-01-21 DIAGNOSIS — M545 Low back pain, unspecified: Secondary | ICD-10-CM | POA: Insufficient documentation

## 2022-01-21 MED ORDER — HYDROCODONE-ACETAMINOPHEN 5-325 MG PO TABS: 1.0000 | ORAL_TABLET | Freq: Every day | ORAL | 0 refills | Status: DC | PRN

## 2022-01-21 NOTE — Progress Notes (Signed)
ECSWT 1st session: myofascial trigger point Burst 5-10 hz Power: 60J 1500-2000 shocks

## 2022-01-28 ENCOUNTER — Encounter: Payer: PPO | Admitting: Physical Medicine and Rehabilitation

## 2022-01-28 ENCOUNTER — Encounter: Payer: Self-pay | Admitting: Physical Medicine and Rehabilitation

## 2022-01-28 VITALS — BP 146/71 | HR 61 | Temp 98.9°F | Ht 61.0 in | Wt 108.0 lb

## 2022-01-28 DIAGNOSIS — M545 Low back pain, unspecified: Secondary | ICD-10-CM

## 2022-01-28 NOTE — Progress Notes (Signed)
ECSWT 2nd session: myofascial trigger point Burst 5-10 hz Power: 60J 1500-2000 shocks

## 2022-02-04 ENCOUNTER — Encounter: Payer: PPO | Admitting: Physical Medicine and Rehabilitation

## 2022-02-04 ENCOUNTER — Encounter: Payer: Self-pay | Admitting: Physical Medicine and Rehabilitation

## 2022-02-04 VITALS — BP 121/64 | HR 67 | Ht 61.0 in | Wt 108.0 lb

## 2022-02-04 DIAGNOSIS — M8000XA Age-related osteoporosis with current pathological fracture, unspecified site, initial encounter for fracture: Secondary | ICD-10-CM

## 2022-02-04 NOTE — Progress Notes (Signed)
ECSWT 3rd session: myofascial trigger point Burst 5-10 hz Power: 60J 1500-2000 shocks

## 2022-02-11 ENCOUNTER — Encounter: Payer: PPO | Attending: Physical Medicine and Rehabilitation | Admitting: Physical Medicine and Rehabilitation

## 2022-02-11 ENCOUNTER — Encounter: Payer: Self-pay | Admitting: Physical Medicine and Rehabilitation

## 2022-02-11 VITALS — BP 142/68 | HR 72 | Ht 61.0 in | Wt 110.6 lb

## 2022-02-11 DIAGNOSIS — M8000XA Age-related osteoporosis with current pathological fracture, unspecified site, initial encounter for fracture: Secondary | ICD-10-CM | POA: Insufficient documentation

## 2022-02-11 DIAGNOSIS — M545 Low back pain, unspecified: Secondary | ICD-10-CM

## 2022-02-11 IMAGING — CR DG LUMBAR SPINE 2-3V
2 series · 2 of 2 positions shown · non-contrast
Comparison: May 27, 2019.

CLINICAL DATA: Possible foreign body.

EXAM:
LUMBAR SPINE - 2-3 VIEW

[w lumbar spine ap]
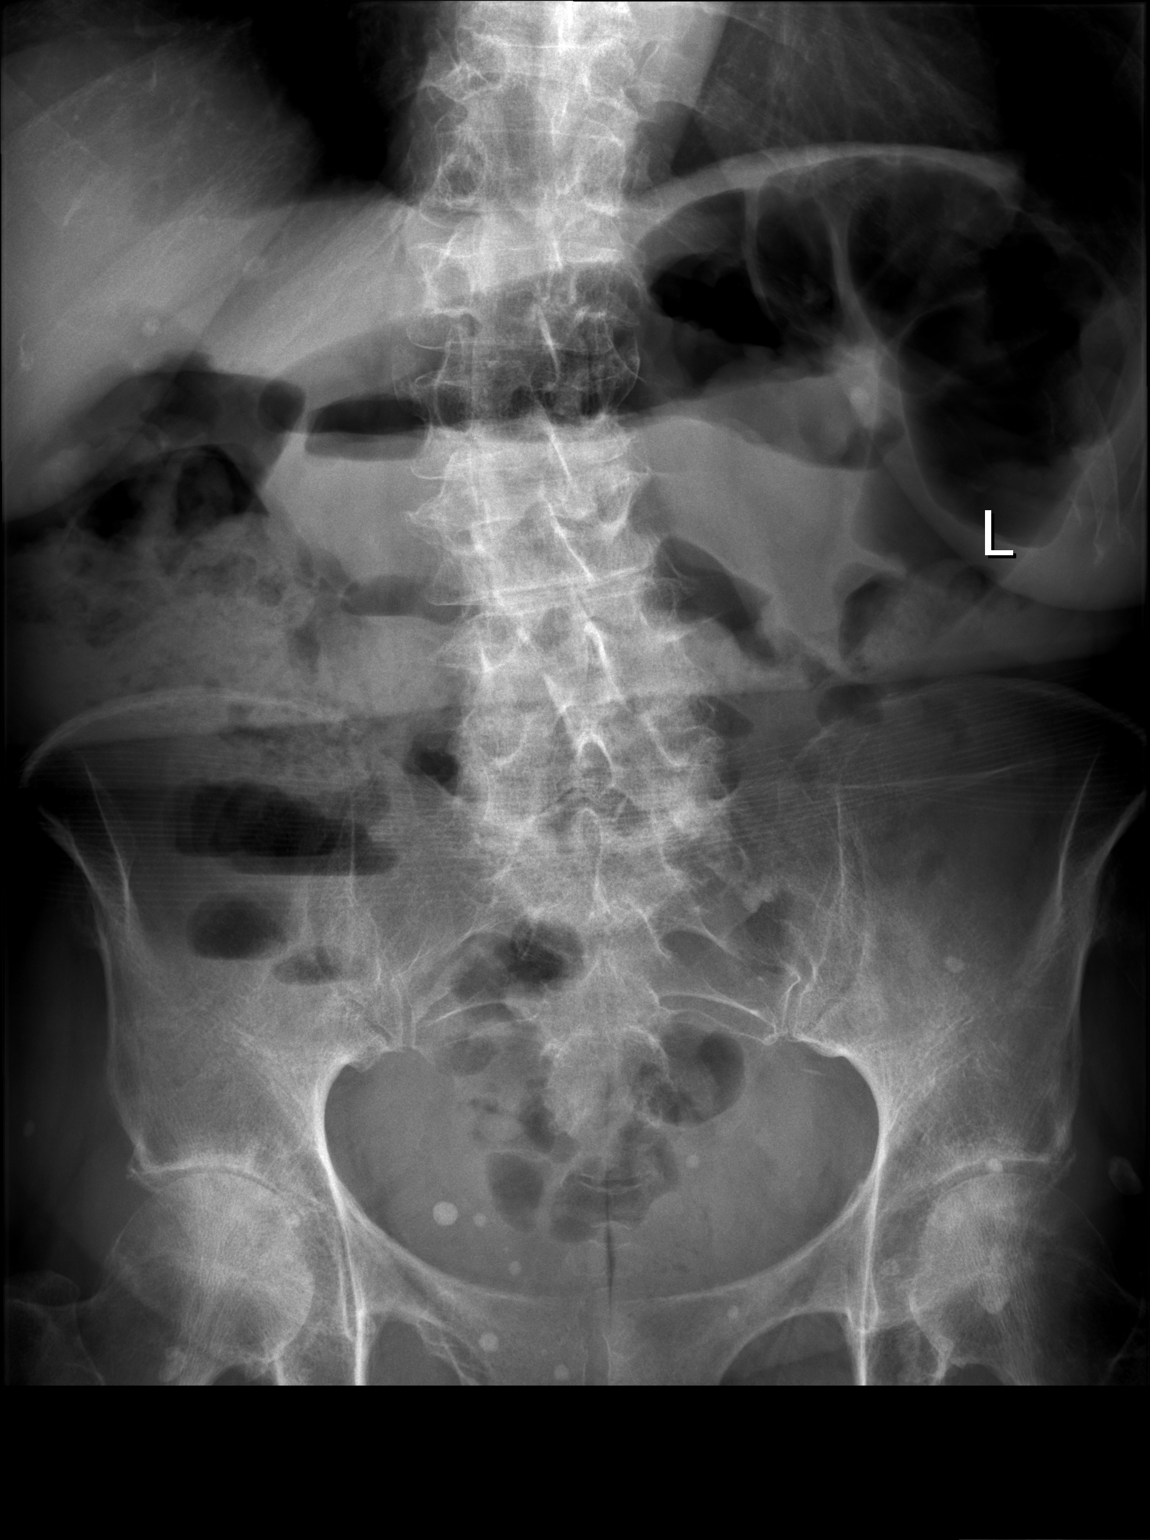

[w lumbar spine lat]
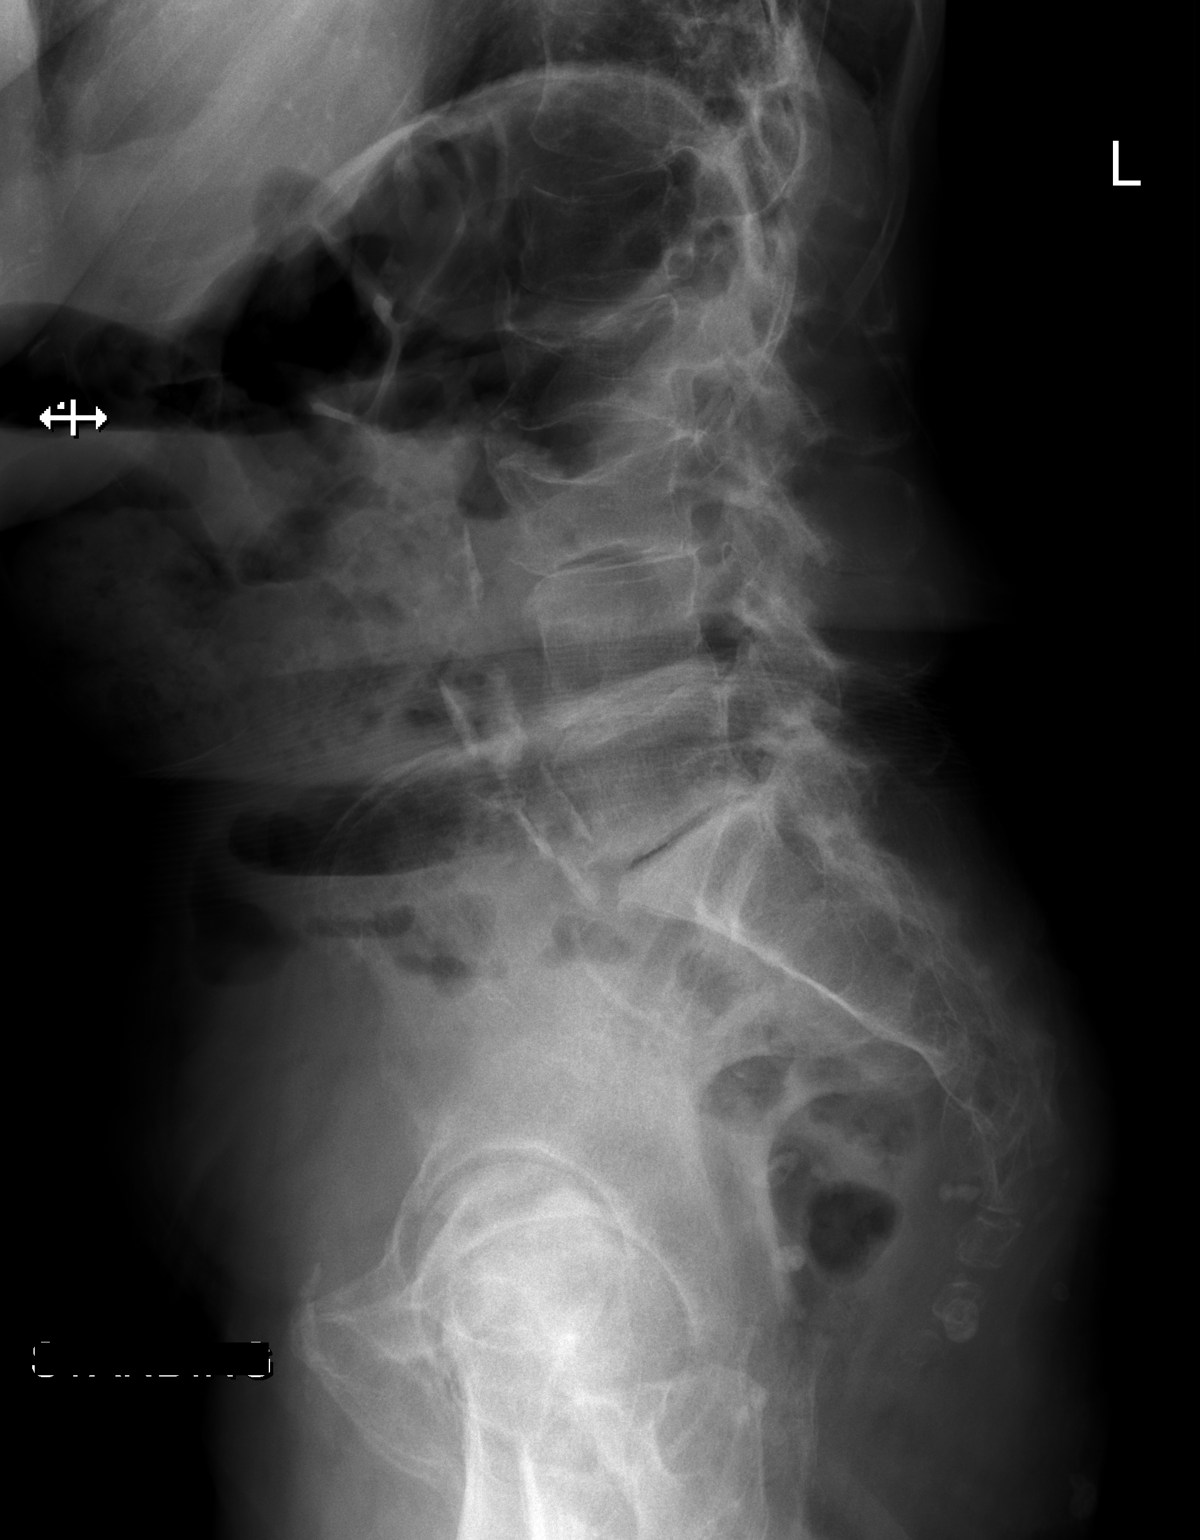

[2 of 2 positions shown; findings below may reference images not displayed]

FINDINGS: Stable old fractures are seen involving the L2 and L3 vertebral
bodies. No acute fracture or spondylolisthesis is noted. Severe
degenerative disc disease is noted at L3-4, L4-5 and L5-S1.
Phleboliths are noted in the pelvis. No radiopaque foreign body is
noted.
IMPRESSION: Severe multilevel degenerative disc disease. No acute abnormality
seen in the lumbar spine. No radiopaque foreign body is noted.

Aortic Atherosclerosis ([id number]).

## 2022-02-11 IMAGING — MR MR ABDOMEN W/O CM
8 of 9 series · 37 of 48 positions shown · non-contrast
Comparison: CT abdomen pelvis, 07/14/2016

CLINICAL DATA: Weight loss, attention to pancreas

EXAM:
MRI ABDOMEN WITHOUT CONTRAST
TECHNIQUE: Multiplanar multisequence MR imaging was performed without the
administration of intravenous contrast.

[Series 3: T2 · coronal · 5.0mm · 1.17mm/px · 2 of 34 slices shown (1 of 3)]
[im 1/34]
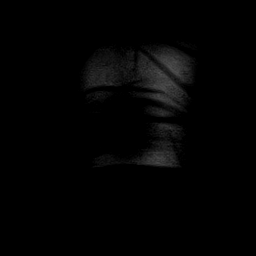
[im 34/34]
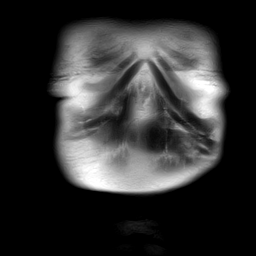

[Series 4: T1 · axial · 3.0mm · 1.06mm/px · z∈[-69,+120]mm · 9 of 128 slices shown]
[im 1/128]
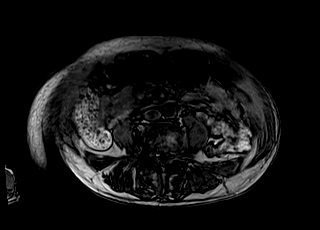
[im 22/128]
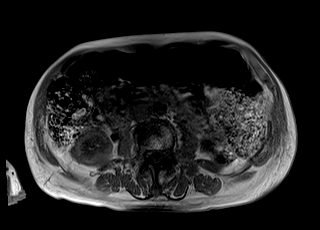
[im 43/128]
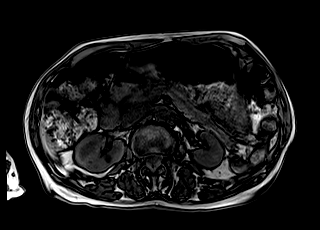
[im 53/128]
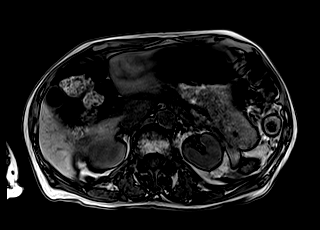
[im 64/128]
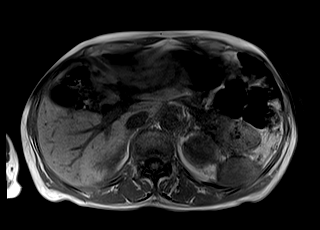
[im 75/128]
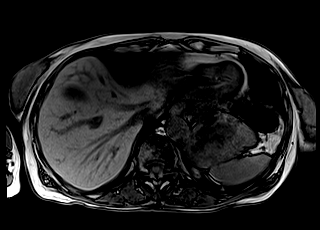
[im 85/128]
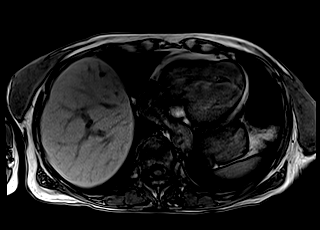
[im 106/128]
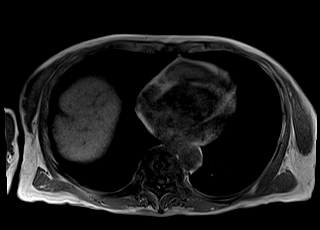
[im 128/128]
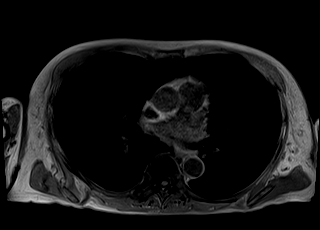

[Series 5: T2 · axial · 6.0mm · 1.00mm/px · z∈[-76,+118]mm · 3 of 28 slices shown (2 of 3)]
[im 1/28]
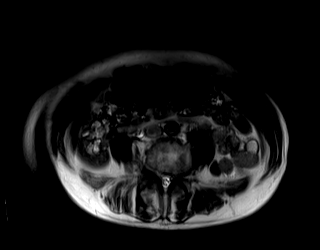
[im 14/28]
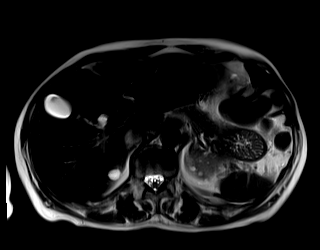
[im 28/28]
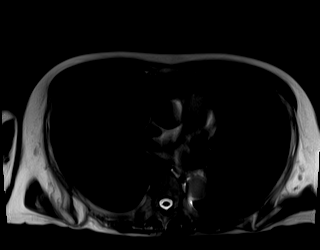

[Series 6: bSSFP · axial · 5.0mm · 1.18mm/px · z∈[-90,+132]mm · 4 of 38 slices shown]
[im 1/38]
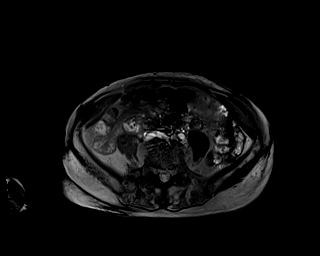
[im 13/38]
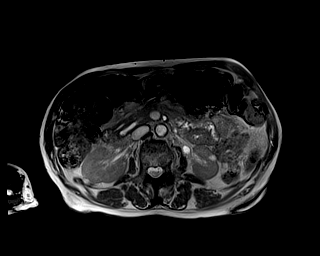
[im 25/38]
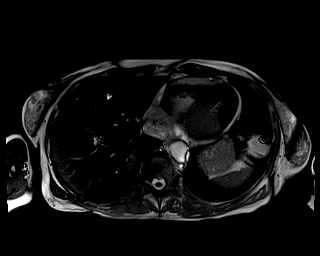
[im 38/38]
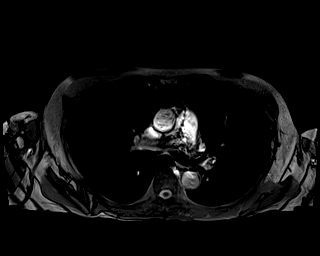

[Series 7: DWI · axial · 5.0mm · 1.14mm/px · z∈[-79,+131]mm · 8 of 108 slices shown (1 of 2)]
[im 1/108]
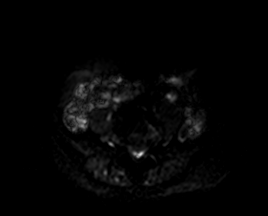
[im 22/108]
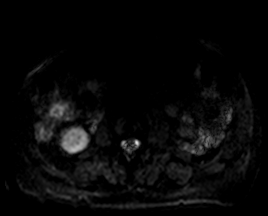
[im 33/108]
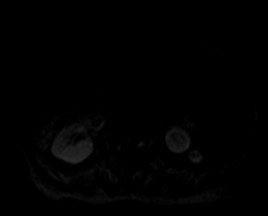
[im 43/108]
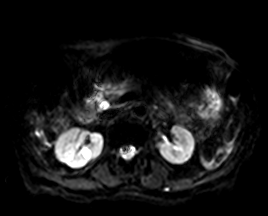
[im 65/108]
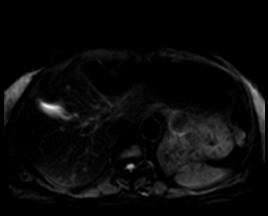
[im 75/108]
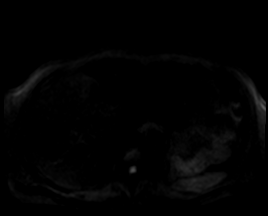
[im 86/108]
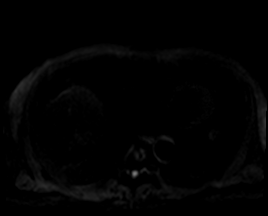
[im 108/108]
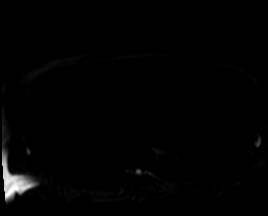

[Series 8: DWI · axial · 5.0mm · 1.14mm/px · z∈[-79,+131]mm · 4 of 36 slices shown (2 of 2)]
[im 1/36]
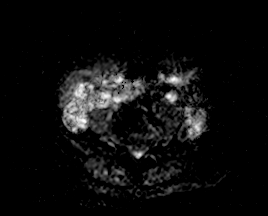
[im 12/36]
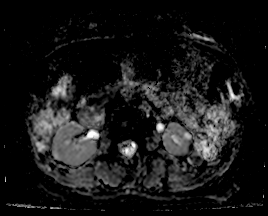
[im 24/36]
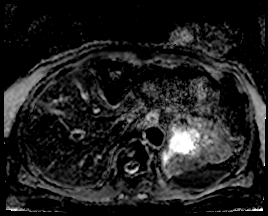
[im 36/36]
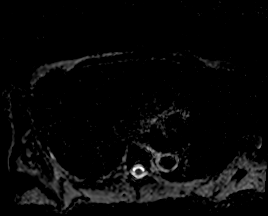

[Series 10: T2 · axial · 5.0mm · 1.33mm/px · z∈[-69,+129]mm · 3 of 34 slices shown (3 of 3)]
[im 1/34]
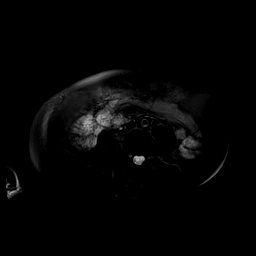
[im 17/34]
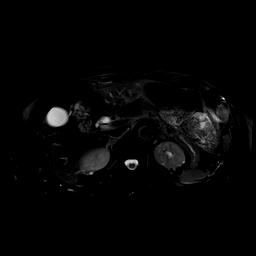
[im 34/34]
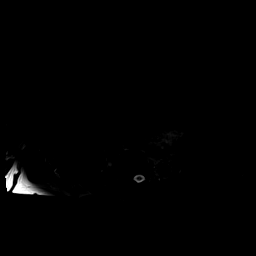

[Series 11: MRCP · coronal · 1.0mm · 0.39mm/px · 4 of 64 slices shown]
[im 1/64]
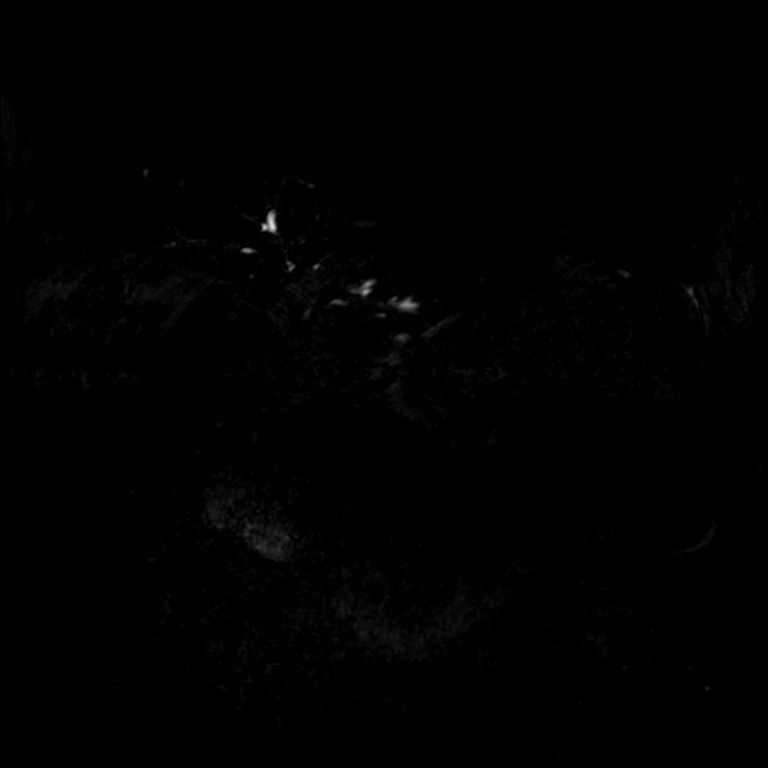
[im 13/64]
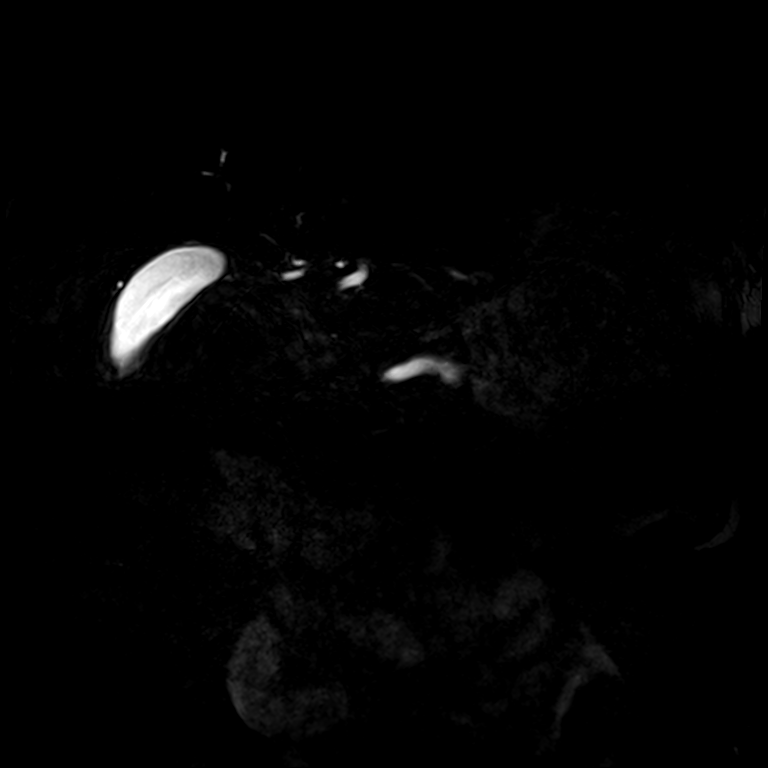
[im 26/64]
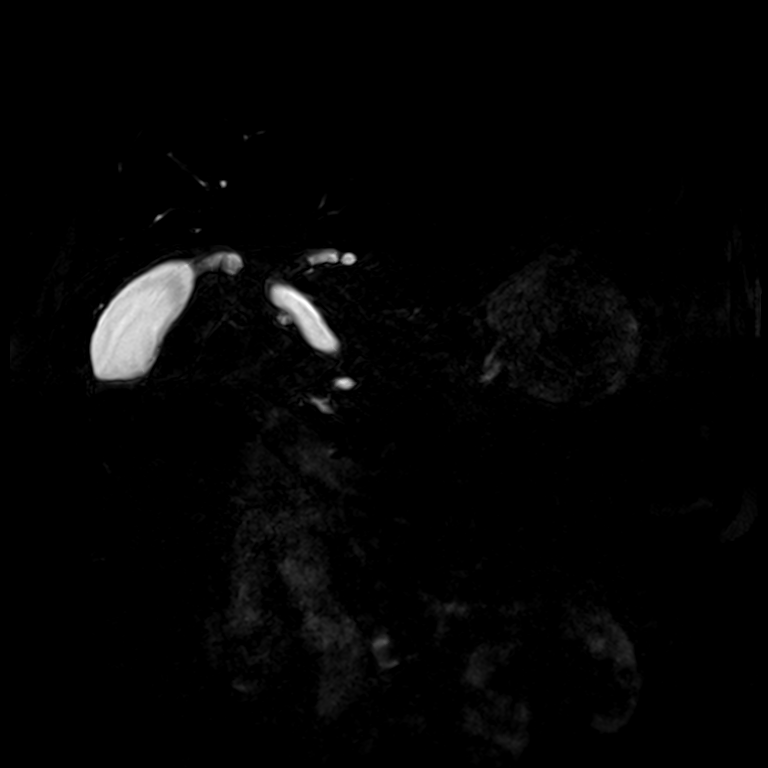
[im 38/64]
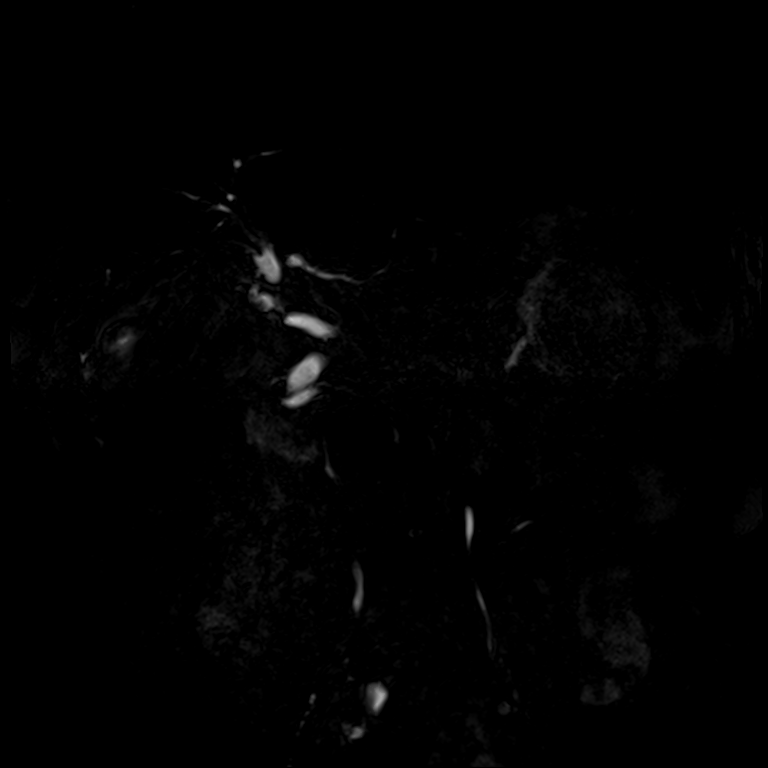

[37 of 48 positions shown; findings below may reference images not displayed]

FINDINGS: Lower chest: No acute findings.

Hepatobiliary: No mass or other parenchymal abnormality identified.
Sludge in the dependent gallbladder. Mild intra and extrahepatic
biliary ductal dilatation, the central common bile duct measuring up
to 9 mm, similar to prior examination.

Pancreas: No mass, inflammatory changes, or other parenchymal
abnormality identified. Diffuse dilatation of the pancreatic duct to
the ampulla, measuring up 5 mm in caliber, similar prior
examination. No obvious mass or other abnormality of the pancreatic
head.

Spleen:  Within normal limits in size and appearance.

Adrenals/Urinary Tract: No masses identified. Fluid signal cysts of
the kidneys bilaterally. No evidence of hydronephrosis.

Stomach/Bowel: Visualized portions within the abdomen are
unremarkable.

Vascular/Lymphatic: No pathologically enlarged lymph nodes
identified. No abdominal aortic aneurysm demonstrated.

Other:  None.

Musculoskeletal: No suspicious bone lesions identified.
IMPRESSION: 1. Diffuse dilatation of the pancreatic duct to the ampulla,
measuring up to 5 mm in caliber, similar to prior examination. No
obvious mass or other abnormality of the pancreatic head on
noncontrast MR.
2. Mild intra and extrahepatic biliary ductal dilatation, the
central common bile duct measuring up to 9 mm, similar to prior
examination. No evidence of calculus or other obstructing lesion to
the ampulla.
3. Sludge in the dependent gallbladder.

## 2022-02-11 NOTE — Progress Notes (Signed)
ECSWT 4th  session: myofascial trigger point Burst 5-10 hz Power: 60J 1500-2000 shocks  55% percent improvement noted

## 2022-02-18 ENCOUNTER — Encounter: Payer: PPO | Admitting: Physical Medicine and Rehabilitation

## 2022-02-18 VITALS — BP 177/63 | HR 78 | Ht 61.0 in | Wt 107.4 lb

## 2022-02-18 DIAGNOSIS — M8000XA Age-related osteoporosis with current pathological fracture, unspecified site, initial encounter for fracture: Secondary | ICD-10-CM

## 2022-02-18 MED ORDER — HYDROCODONE-ACETAMINOPHEN 5-325 MG PO TABS: 1.0000 | ORAL_TABLET | Freq: Every day | ORAL | 0 refills | Status: DC | PRN

## 2022-02-18 NOTE — Progress Notes (Signed)
ECSWT 5th  session: myofascial trigger point Burst 5-10 hz Power: 60J 1500-2000 shocks  55% percent improvement noted

## 2022-02-20 ENCOUNTER — Telehealth: Payer: Self-pay

## 2022-02-20 NOTE — Telephone Encounter (Signed)
Patient called in to check on refill for hydrocodone, advised it was sent to pharmacy on the 13th, also wanted to make sure [facility] referral was put in, advised those orders were put in as well

## 2022-02-27 ENCOUNTER — Encounter: Payer: Self-pay | Admitting: Physical Medicine and Rehabilitation

## 2022-02-27 ENCOUNTER — Encounter: Payer: PPO | Admitting: Physical Medicine and Rehabilitation

## 2022-02-27 VITALS — BP 125/72 | HR 67 | Ht 61.0 in | Wt 107.0 lb

## 2022-02-27 DIAGNOSIS — G629 Polyneuropathy, unspecified: Secondary | ICD-10-CM | POA: Diagnosis not present

## 2022-02-27 DIAGNOSIS — M8000XA Age-related osteoporosis with current pathological fracture, unspecified site, initial encounter for fracture: Secondary | ICD-10-CM

## 2022-02-27 DIAGNOSIS — Z79891 Long term (current) use of opiate analgesic: Secondary | ICD-10-CM | POA: Diagnosis not present

## 2022-02-27 DIAGNOSIS — Z7952 Long term (current) use of systemic steroids: Secondary | ICD-10-CM | POA: Diagnosis not present

## 2022-02-27 DIAGNOSIS — M8008XD Age-related osteoporosis with current pathological fracture, vertebra(e), subsequent encounter for fracture with routine healing: Secondary | ICD-10-CM | POA: Diagnosis not present

## 2022-02-27 DIAGNOSIS — M1990 Unspecified osteoarthritis, unspecified site: Secondary | ICD-10-CM | POA: Diagnosis not present

## 2022-02-27 DIAGNOSIS — Z9181 History of falling: Secondary | ICD-10-CM | POA: Diagnosis not present

## 2022-02-27 DIAGNOSIS — Z87891 Personal history of nicotine dependence: Secondary | ICD-10-CM | POA: Diagnosis not present

## 2022-02-27 DIAGNOSIS — Z79899 Other long term (current) drug therapy: Secondary | ICD-10-CM | POA: Diagnosis not present

## 2022-02-27 DIAGNOSIS — I10 Essential (primary) hypertension: Secondary | ICD-10-CM | POA: Diagnosis not present

## 2022-02-27 NOTE — Progress Notes (Signed)
ECSWT 6th session: myofascial trigger point Burst 5-10 hz Power: 60J 1500-2000 shocks  55% percent improvement noted

## 2022-03-07 DIAGNOSIS — Z9181 History of falling: Secondary | ICD-10-CM | POA: Diagnosis not present

## 2022-03-07 DIAGNOSIS — Z87891 Personal history of nicotine dependence: Secondary | ICD-10-CM | POA: Diagnosis not present

## 2022-03-07 DIAGNOSIS — Z79899 Other long term (current) drug therapy: Secondary | ICD-10-CM | POA: Diagnosis not present

## 2022-03-07 DIAGNOSIS — Z79891 Long term (current) use of opiate analgesic: Secondary | ICD-10-CM | POA: Diagnosis not present

## 2022-03-07 DIAGNOSIS — I10 Essential (primary) hypertension: Secondary | ICD-10-CM | POA: Diagnosis not present

## 2022-03-07 DIAGNOSIS — G629 Polyneuropathy, unspecified: Secondary | ICD-10-CM | POA: Diagnosis not present

## 2022-03-07 DIAGNOSIS — M1990 Unspecified osteoarthritis, unspecified site: Secondary | ICD-10-CM | POA: Diagnosis not present

## 2022-03-07 DIAGNOSIS — M8008XD Age-related osteoporosis with current pathological fracture, vertebra(e), subsequent encounter for fracture with routine healing: Secondary | ICD-10-CM | POA: Diagnosis not present

## 2022-03-07 DIAGNOSIS — Z7952 Long term (current) use of systemic steroids: Secondary | ICD-10-CM | POA: Diagnosis not present

## 2022-03-13 DIAGNOSIS — M5136 Other intervertebral disc degeneration, lumbar region: Secondary | ICD-10-CM | POA: Diagnosis not present

## 2022-03-13 DIAGNOSIS — M1990 Unspecified osteoarthritis, unspecified site: Secondary | ICD-10-CM | POA: Diagnosis not present

## 2022-03-13 DIAGNOSIS — Z79899 Other long term (current) drug therapy: Secondary | ICD-10-CM | POA: Diagnosis not present

## 2022-03-13 DIAGNOSIS — M810 Age-related osteoporosis without current pathological fracture: Secondary | ICD-10-CM | POA: Diagnosis not present

## 2022-03-13 DIAGNOSIS — M549 Dorsalgia, unspecified: Secondary | ICD-10-CM | POA: Diagnosis not present

## 2022-03-13 DIAGNOSIS — M25552 Pain in left hip: Secondary | ICD-10-CM | POA: Diagnosis not present

## 2022-03-13 DIAGNOSIS — M25551 Pain in right hip: Secondary | ICD-10-CM | POA: Diagnosis not present

## 2022-03-13 DIAGNOSIS — D649 Anemia, unspecified: Secondary | ICD-10-CM | POA: Diagnosis not present

## 2022-03-13 DIAGNOSIS — M3589 Other specified systemic involvement of connective tissue: Secondary | ICD-10-CM | POA: Diagnosis not present

## 2022-03-13 DIAGNOSIS — N183 Chronic kidney disease, stage 3 unspecified: Secondary | ICD-10-CM | POA: Diagnosis not present

## 2022-03-22 DIAGNOSIS — Z9181 History of falling: Secondary | ICD-10-CM | POA: Diagnosis not present

## 2022-03-22 DIAGNOSIS — Z79899 Other long term (current) drug therapy: Secondary | ICD-10-CM | POA: Diagnosis not present

## 2022-03-22 DIAGNOSIS — G629 Polyneuropathy, unspecified: Secondary | ICD-10-CM | POA: Diagnosis not present

## 2022-03-22 DIAGNOSIS — Z79891 Long term (current) use of opiate analgesic: Secondary | ICD-10-CM | POA: Diagnosis not present

## 2022-03-22 DIAGNOSIS — I10 Essential (primary) hypertension: Secondary | ICD-10-CM | POA: Diagnosis not present

## 2022-03-22 DIAGNOSIS — M8008XD Age-related osteoporosis with current pathological fracture, vertebra(e), subsequent encounter for fracture with routine healing: Secondary | ICD-10-CM | POA: Diagnosis not present

## 2022-03-22 DIAGNOSIS — M1990 Unspecified osteoarthritis, unspecified site: Secondary | ICD-10-CM | POA: Diagnosis not present

## 2022-03-22 DIAGNOSIS — Z87891 Personal history of nicotine dependence: Secondary | ICD-10-CM | POA: Diagnosis not present

## 2022-03-22 DIAGNOSIS — Z7952 Long term (current) use of systemic steroids: Secondary | ICD-10-CM | POA: Diagnosis not present

## 2022-03-24 NOTE — Progress Notes (Unsigned)
Subjective:    Patient ID: Holly Jimenez, female    DOB: [birth year], 77 y.o.   MRN: [id number]  HPI: Holly Jimenez is a 77 y.o. female who returns for follow up appointment for chronic pain and medication refill. She states her pain is located in her lower back. She rates her pain 7. Her current exercise regime is walking and performing stretching exercises.  Ms. Obrochta Morphine equivalent is 5.00 MME.She is also prescribed Alprazolam  by Dr. Theda Sers .We have discussed the black box warning of using opioids and benzodiazepines. I highlighted the dangers of using these drugs together and discussed the adverse events including respiratory suppression, overdose, cognitive impairment and importance of compliance with current regimen. We will continue to monitor and adjust as indicated.  she is being closely monitored and under the care of her psychiatrist.   Last UDS was Performed on 10/22/2021, it was consistent.   She does not find the lyrica helpful   Hydrocodone helps  Lumbar spine pain improved tremendously  Thoracic spine pain is more noticeable.   Received a pain journal     Pain Inventory Average Pain 10 Pain Right Now 7 My pain is sharp, stabbing, and aching  In the last 24 hours, has pain interfered with the following? General activity 10 Relation with others 10 Enjoyment of life 10 What TIME of day is your pain at its worst? daytime and evening Sleep (in general) Good  Pain is worse with: walking, bending, standing, and some activites Pain improves with: rest, heat/ice, and medication Relief from Meds: 5  Family History  Problem Relation Age of Onset   Ovarian cancer Sister    Colon cancer Sister    Heart disease Paternal Grandfather    Heart disease Maternal Grandfather    Kidney disease Brother    Kidney disease Mother    Hypertension Mother    Kidney disease Maternal Grandmother    Diabetes Paternal Grandmother    Asthma  Father    Suicidality Father    Alcoholism Father    Social History   Socioeconomic History   Marital status: Divorced    Spouse name: Not on file   Number of children: 1   Years of education: Associates   Highest education level: Not on file  Occupational History   Occupation: retired    Fish farm manager: [employer]  Tobacco Use   Smoking status: Former    Packs/day: 1.00    Years: 25.00    Additional pack years: 0.00    Total pack years: 25.00    Types: Cigarettes    Quit date: 03/05/2000    Years since quitting: 22.0   Smokeless tobacco: Never  Vaping Use   Vaping Use: Never used  Substance and Sexual Activity   Alcohol use: Yes    Comment: Rare   Drug use: No    Frequency: 2.0 times per week    Types: Marijuana    Comment: previous use of marijuana for pain   Sexual activity: Not on file  Other Topics Concern   Not on file  Social History Narrative   Lives at home alone.   Right-handed.   Rare use of caffeine.   Social Determinants of Health   Financial Resource Strain: Not on file  Food Insecurity: Not on file  Transportation Needs: Not on file  Physical Activity: Not on file  Stress: Not on file  Social Connections: Not on file   Past Surgical History:  Procedure Laterality Date  ABDOMINAL HYSTERECTOMY  1975   APPENDECTOMY  1972   FOOT SURGERY     LEFT   KYPHOPLASTY Bilateral 03/08/2014   Procedure: Thoracic nine Kyphoplasty;  Surgeon: Ashok Pall, MD;  Location: [place] [facility];  Service: Neurosurgery;  Laterality: Bilateral;  Thoracic nine Kyphoplasty   LIPOMA EXCISION     back   TONSILLECTOMY     WRIST GANGLION EXCISION     left   Past Surgical History:  Procedure Laterality Date   ABDOMINAL HYSTERECTOMY  1975   APPENDECTOMY  1972   FOOT SURGERY     LEFT   KYPHOPLASTY Bilateral 03/08/2014   Procedure: Thoracic nine Kyphoplasty;  Surgeon: Ashok Pall, MD;  Location: [place] [facility];  Service: Neurosurgery;  Laterality: Bilateral;  Thoracic nine  Kyphoplasty   LIPOMA EXCISION     back   TONSILLECTOMY     WRIST GANGLION EXCISION     left   Past Medical History:  Diagnosis Date   Abnormal LFTs    Allergic rhinitis    Anemia    Anxiety    Arthritis    Chronic back pain    Chronic headaches    Chronic kidney disease, unspecified    DDD (degenerative disc disease)    GERD (gastroesophageal reflux disease)    Headache    Hypercalcemia    Hyperkalemia    Hyperlipidemia    Hypertension    KIDNEY SPECIALIST TOOK OFF BENICAR   IBS (irritable bowel syndrome)    Lupus (HCC)    Mixed connective tissue disease (HCC)    Nausea    Neuropathy    Peptic ulcer disease    Raynaud's disease    Small kidney    left   Vitamin D deficiency disease    There were no vitals taken for this visit.  Opioid Risk Score:   Fall Risk Score:  `1  Depression screen [facility] 2/9     02/27/2022   11:16 AM 02/04/2022   11:40 AM 01/28/2022   11:29 AM 01/21/2022   11:45 AM 10/22/2021   11:16 AM 08/15/2021   11:42 AM 06/11/2021   11:35 AM  Depression screen PHQ 2/9  Decreased Interest 0 0 0 0 0 0 0  Down, Depressed, Hopeless 0 0 0 0 0 0 0  PHQ - 2 Score 0 0 0 0 0 0 0  Altered sleeping   0      PHQ-9 Score   0           Review of Systems  Musculoskeletal:  Positive for neck pain.  All other systems reviewed and are negative.     Objective:   Physical Exam Vitals and nursing note reviewed.  Constitutional:      Appearance: Normal appearance.  Cardiovascular:     Rate and Rhythm: Normal rate and regular rhythm.     Pulses: Normal pulses.     Heart sounds: Normal heart sounds.  Pulmonary:     Effort: Pulmonary effort is normal.     Breath sounds: Normal breath sounds.  Musculoskeletal:     Cervical back: Normal range of motion and neck supple.     Comments: Normal Muscle Bulk and Muscle Testing Reveals:  Upper Extremities: Full ROM and Muscle Strength 5/5  Lumbar Paraspinal Tenderness: L-4-L-5 Lower Extremities : Full ROM and Muscle  Strength 5/5 Arises from Table  slowly using cane for support Antalgic Gait     Skin:    General: Skin is warm and dry.  Neurological:  Mental Status: She is alert and oriented to person, place, and time.  Psychiatric:        Mood and Affect: Mood normal.        Behavior: Behavior normal.         Assessment & Plan:  Lumbar Spinal Stenosis: Continue HEP as Tolerated . Continue to monitor. 11/14/2021 Chronic Low Back Pain without sciatica: She is scheduled for Shock Wave therapy  with Dr Ranell Patrick. Continue HEP as Tolerated. Continue current medication regimen. 11/14/2021 Intercostal Neuralgia:  No complaints today. Continue Lyrica. Continue to Monitor.11/14/2021 Chronic Pain Syndrome: Refilled Hydrocodone 5mg /325 one tablet daily as needed for pain #30. Second script sent for the following month.  We will continue the opioid monitoring program, this consists of regular clinic visits, examinations, urine drug screen, pill counts as well as use of [place] Controlled Substance Reporting system. A 12 month History has been reviewed on the [place] Controlled Substance Reporting System on 11/14/2021. Marland KitchenCervicalgia/ Cervical Radiculitis: Continue Lyrica. Continue HEP as tolerated. Continue current medication regimen. Continue to monitor. 11/14/2021 Left Shoulder Tendonitis:  Continue to alternate Ice and Heat Therapy. Continue to Monitor. 11/14/2021 Thoracic spine pain Discussed extracorporeal shockwave therapy as a modality for treatment. Discussed that the device looks and feels like a massage gun and I would move it over the area of pain for about 10 minutes. The device releases sound waves to the area of pain and helps to improve blood flow and circulation to improve the healing process. Discuss that this initially induces inflammation and can sometimes cause short-term increase in pain. Discussed that we typically do three weekly treatments, but sometimes up to 6 if needed, and  after 6 weeks long term benefits can sometimes be achieved. Discussed that this is an FDA approved device, but not covered by insurance and would cost $60 per session. Will scheduled patient for 6 consecutive appointments and can cancel latter three if benefits are achieved after first three sessions.   Right hip pain: recommended red light therapy, Percocet increased to BID   F/U in 2 months: This was discussed with Dr Ranell Patrick she agrees with every 2 month visits.

## 2022-03-25 ENCOUNTER — Telehealth: Payer: Self-pay | Admitting: *Deleted

## 2022-03-25 ENCOUNTER — Encounter: Payer: PPO | Attending: Physical Medicine and Rehabilitation | Admitting: Physical Medicine and Rehabilitation

## 2022-03-25 VITALS — BP 146/68 | HR 76 | Ht 61.0 in | Wt 109.0 lb

## 2022-03-25 DIAGNOSIS — Z79899 Other long term (current) drug therapy: Secondary | ICD-10-CM | POA: Insufficient documentation

## 2022-03-25 DIAGNOSIS — G894 Chronic pain syndrome: Secondary | ICD-10-CM | POA: Insufficient documentation

## 2022-03-25 DIAGNOSIS — Z5181 Encounter for therapeutic drug level monitoring: Secondary | ICD-10-CM | POA: Diagnosis not present

## 2022-03-25 MED ORDER — HYDROCODONE-ACETAMINOPHEN 5-325 MG PO TABS: 1.0000 | ORAL_TABLET | Freq: Two times a day (BID) | ORAL | 0 refills | Status: DC | PRN

## 2022-03-25 NOTE — Telephone Encounter (Signed)
Cecelia calling from [facility] for verbal to continue therapy 1 x W for 5 weeks. Strength, balance and gait training. Verbal given

## 2022-03-25 NOTE — Addendum Note (Signed)
Addended by: Stephanie Coup on: 03/25/2022 12:25 PM   Modules accepted: Orders

## 2022-03-25 NOTE — Patient Instructions (Signed)
.  Turmeric to reduce inflammation--can be used in cooking or taken as a supplement.  Benefits of turmeric:  -Highly anti-inflammatory  -Increases antioxidants  -Improves memory, attention, brain disease  -Lowers risk of heart disease  -May help prevent cancer  -Decreases pain  -Alleviates depression  -Delays aging and decreases risk of chronic disease  -Consume with black pepper to increase absorption    Turmeric Milk Recipe:  1 cup milk  1 tsp turmeric  1 tsp cinnamon  1 tsp grated ginger (optional)  Black pepper (boosts the anti-inflammatory properties of turmeric).  1 tsp honey 

## 2022-03-28 DIAGNOSIS — I10 Essential (primary) hypertension: Secondary | ICD-10-CM | POA: Diagnosis not present

## 2022-03-28 DIAGNOSIS — G629 Polyneuropathy, unspecified: Secondary | ICD-10-CM | POA: Diagnosis not present

## 2022-03-28 DIAGNOSIS — Z9181 History of falling: Secondary | ICD-10-CM | POA: Diagnosis not present

## 2022-03-28 DIAGNOSIS — Z87891 Personal history of nicotine dependence: Secondary | ICD-10-CM | POA: Diagnosis not present

## 2022-03-28 DIAGNOSIS — Z7952 Long term (current) use of systemic steroids: Secondary | ICD-10-CM | POA: Diagnosis not present

## 2022-03-28 DIAGNOSIS — M1990 Unspecified osteoarthritis, unspecified site: Secondary | ICD-10-CM | POA: Diagnosis not present

## 2022-03-28 DIAGNOSIS — Z79891 Long term (current) use of opiate analgesic: Secondary | ICD-10-CM | POA: Diagnosis not present

## 2022-03-28 DIAGNOSIS — M8008XD Age-related osteoporosis with current pathological fracture, vertebra(e), subsequent encounter for fracture with routine healing: Secondary | ICD-10-CM | POA: Diagnosis not present

## 2022-03-28 DIAGNOSIS — Z79899 Other long term (current) drug therapy: Secondary | ICD-10-CM | POA: Diagnosis not present

## 2022-04-01 DIAGNOSIS — E875 Hyperkalemia: Secondary | ICD-10-CM | POA: Diagnosis not present

## 2022-04-01 DIAGNOSIS — D631 Anemia in chronic kidney disease: Secondary | ICD-10-CM | POA: Diagnosis not present

## 2022-04-01 DIAGNOSIS — I129 Hypertensive chronic kidney disease with stage 1 through stage 4 chronic kidney disease, or unspecified chronic kidney disease: Secondary | ICD-10-CM | POA: Diagnosis not present

## 2022-04-01 DIAGNOSIS — N189 Chronic kidney disease, unspecified: Secondary | ICD-10-CM | POA: Diagnosis not present

## 2022-04-01 DIAGNOSIS — N179 Acute kidney failure, unspecified: Secondary | ICD-10-CM | POA: Diagnosis not present

## 2022-04-01 DIAGNOSIS — N2581 Secondary hyperparathyroidism of renal origin: Secondary | ICD-10-CM | POA: Diagnosis not present

## 2022-04-01 DIAGNOSIS — N184 Chronic kidney disease, stage 4 (severe): Secondary | ICD-10-CM | POA: Diagnosis not present

## 2022-04-03 DIAGNOSIS — N184 Chronic kidney disease, stage 4 (severe): Secondary | ICD-10-CM | POA: Diagnosis not present

## 2022-04-12 DIAGNOSIS — Z7952 Long term (current) use of systemic steroids: Secondary | ICD-10-CM | POA: Diagnosis not present

## 2022-04-12 DIAGNOSIS — Z9181 History of falling: Secondary | ICD-10-CM | POA: Diagnosis not present

## 2022-04-12 DIAGNOSIS — Z79891 Long term (current) use of opiate analgesic: Secondary | ICD-10-CM | POA: Diagnosis not present

## 2022-04-12 DIAGNOSIS — Z87891 Personal history of nicotine dependence: Secondary | ICD-10-CM | POA: Diagnosis not present

## 2022-04-12 DIAGNOSIS — M1990 Unspecified osteoarthritis, unspecified site: Secondary | ICD-10-CM | POA: Diagnosis not present

## 2022-04-12 DIAGNOSIS — M8008XD Age-related osteoporosis with current pathological fracture, vertebra(e), subsequent encounter for fracture with routine healing: Secondary | ICD-10-CM | POA: Diagnosis not present

## 2022-04-12 DIAGNOSIS — Z79899 Other long term (current) drug therapy: Secondary | ICD-10-CM | POA: Diagnosis not present

## 2022-04-12 DIAGNOSIS — G629 Polyneuropathy, unspecified: Secondary | ICD-10-CM | POA: Diagnosis not present

## 2022-04-12 DIAGNOSIS — I10 Essential (primary) hypertension: Secondary | ICD-10-CM | POA: Diagnosis not present

## 2022-04-15 ENCOUNTER — Other Ambulatory Visit (HOSPITAL_COMMUNITY): Payer: Self-pay | Admitting: *Deleted

## 2022-04-16 ENCOUNTER — Ambulatory Visit (HOSPITAL_COMMUNITY)
Admission: RE | Admit: 2022-04-16 | Discharge: 2022-04-16 | Disposition: A | Discharge status: Home / Self Care | Payer: PPO | Source: Ambulatory Visit | Attending: Nephrology | Admitting: Nephrology

## 2022-04-16 DIAGNOSIS — N189 Chronic kidney disease, unspecified: Secondary | ICD-10-CM | POA: Diagnosis not present

## 2022-04-16 DIAGNOSIS — D631 Anemia in chronic kidney disease: Secondary | ICD-10-CM | POA: Diagnosis not present

## 2022-04-16 MED ORDER — SODIUM CHLORIDE 0.9 % IV SOLN
510.0000 mg | Freq: Once | INTRAVENOUS | Status: AC
  Administered 2022-04-16: 510 mg via INTRAVENOUS
  Filled 2022-04-16: qty 510

## 2022-04-24 DIAGNOSIS — M1990 Unspecified osteoarthritis, unspecified site: Secondary | ICD-10-CM | POA: Diagnosis not present

## 2022-04-24 DIAGNOSIS — M8008XD Age-related osteoporosis with current pathological fracture, vertebra(e), subsequent encounter for fracture with routine healing: Secondary | ICD-10-CM | POA: Diagnosis not present

## 2022-04-24 DIAGNOSIS — G629 Polyneuropathy, unspecified: Secondary | ICD-10-CM | POA: Diagnosis not present

## 2022-04-24 DIAGNOSIS — Z79891 Long term (current) use of opiate analgesic: Secondary | ICD-10-CM | POA: Diagnosis not present

## 2022-04-24 DIAGNOSIS — Z79899 Other long term (current) drug therapy: Secondary | ICD-10-CM | POA: Diagnosis not present

## 2022-04-24 DIAGNOSIS — Z7952 Long term (current) use of systemic steroids: Secondary | ICD-10-CM | POA: Diagnosis not present

## 2022-04-24 DIAGNOSIS — Z9181 History of falling: Secondary | ICD-10-CM | POA: Diagnosis not present

## 2022-04-24 DIAGNOSIS — Z87891 Personal history of nicotine dependence: Secondary | ICD-10-CM | POA: Diagnosis not present

## 2022-04-24 DIAGNOSIS — I10 Essential (primary) hypertension: Secondary | ICD-10-CM | POA: Diagnosis not present

## 2022-05-02 DIAGNOSIS — M8008XD Age-related osteoporosis with current pathological fracture, vertebra(e), subsequent encounter for fracture with routine healing: Secondary | ICD-10-CM | POA: Diagnosis not present

## 2022-05-02 DIAGNOSIS — Z7952 Long term (current) use of systemic steroids: Secondary | ICD-10-CM | POA: Diagnosis not present

## 2022-05-02 DIAGNOSIS — I10 Essential (primary) hypertension: Secondary | ICD-10-CM | POA: Diagnosis not present

## 2022-05-02 DIAGNOSIS — Z9181 History of falling: Secondary | ICD-10-CM | POA: Diagnosis not present

## 2022-05-02 DIAGNOSIS — Z87891 Personal history of nicotine dependence: Secondary | ICD-10-CM | POA: Diagnosis not present

## 2022-05-02 DIAGNOSIS — M1990 Unspecified osteoarthritis, unspecified site: Secondary | ICD-10-CM | POA: Diagnosis not present

## 2022-05-02 DIAGNOSIS — G629 Polyneuropathy, unspecified: Secondary | ICD-10-CM | POA: Diagnosis not present

## 2022-05-06 ENCOUNTER — Encounter: Payer: PPO | Attending: Physical Medicine and Rehabilitation | Admitting: Physical Medicine and Rehabilitation

## 2022-05-06 ENCOUNTER — Other Ambulatory Visit: Payer: Self-pay

## 2022-05-06 VITALS — BP 118/62 | HR 64 | Ht 61.0 in | Wt 103.0 lb

## 2022-05-06 DIAGNOSIS — M546 Pain in thoracic spine: Secondary | ICD-10-CM | POA: Insufficient documentation

## 2022-05-06 DIAGNOSIS — I129 Hypertensive chronic kidney disease with stage 1 through stage 4 chronic kidney disease, or unspecified chronic kidney disease: Secondary | ICD-10-CM | POA: Diagnosis not present

## 2022-05-06 DIAGNOSIS — G8929 Other chronic pain: Secondary | ICD-10-CM | POA: Insufficient documentation

## 2022-05-06 DIAGNOSIS — M810 Age-related osteoporosis without current pathological fracture: Secondary | ICD-10-CM | POA: Diagnosis not present

## 2022-05-06 MED ORDER — HYDROCODONE-ACETAMINOPHEN 5-325 MG PO TABS: 1.0000 | ORAL_TABLET | Freq: Two times a day (BID) | ORAL | 0 refills | Status: DC | PRN

## 2022-05-06 NOTE — Progress Notes (Signed)
1st session ECST Low back pain 5-10 Hz Power 60J

## 2022-05-09 DIAGNOSIS — Z87891 Personal history of nicotine dependence: Secondary | ICD-10-CM | POA: Diagnosis not present

## 2022-05-09 DIAGNOSIS — I10 Essential (primary) hypertension: Secondary | ICD-10-CM | POA: Diagnosis not present

## 2022-05-09 DIAGNOSIS — Z7952 Long term (current) use of systemic steroids: Secondary | ICD-10-CM | POA: Diagnosis not present

## 2022-05-09 DIAGNOSIS — Z9181 History of falling: Secondary | ICD-10-CM | POA: Diagnosis not present

## 2022-05-09 DIAGNOSIS — G629 Polyneuropathy, unspecified: Secondary | ICD-10-CM | POA: Diagnosis not present

## 2022-05-09 DIAGNOSIS — M1990 Unspecified osteoarthritis, unspecified site: Secondary | ICD-10-CM | POA: Diagnosis not present

## 2022-05-09 DIAGNOSIS — M8008XD Age-related osteoporosis with current pathological fracture, vertebra(e), subsequent encounter for fracture with routine healing: Secondary | ICD-10-CM | POA: Diagnosis not present

## 2022-05-13 ENCOUNTER — Encounter: Payer: Self-pay | Admitting: Physical Medicine and Rehabilitation

## 2022-05-13 ENCOUNTER — Encounter: Payer: PPO | Attending: Physical Medicine and Rehabilitation | Admitting: Physical Medicine and Rehabilitation

## 2022-05-13 VITALS — BP 147/71 | HR 61 | Ht 61.0 in | Wt 104.4 lb

## 2022-05-13 DIAGNOSIS — S22000A Wedge compression fracture of unspecified thoracic vertebra, initial encounter for closed fracture: Secondary | ICD-10-CM | POA: Insufficient documentation

## 2022-05-13 DIAGNOSIS — G8929 Other chronic pain: Secondary | ICD-10-CM | POA: Insufficient documentation

## 2022-05-13 DIAGNOSIS — M546 Pain in thoracic spine: Secondary | ICD-10-CM | POA: Insufficient documentation

## 2022-05-13 DIAGNOSIS — Z79899 Other long term (current) drug therapy: Secondary | ICD-10-CM | POA: Insufficient documentation

## 2022-05-13 DIAGNOSIS — Z5181 Encounter for therapeutic drug level monitoring: Secondary | ICD-10-CM | POA: Insufficient documentation

## 2022-05-13 DIAGNOSIS — G894 Chronic pain syndrome: Secondary | ICD-10-CM | POA: Insufficient documentation

## 2022-05-13 MED ORDER — BACLOFEN 5 MG PO TABS: 1.0000 | ORAL_TABLET | Freq: Every day | ORAL | 3 refills | Status: DC | PRN

## 2022-05-13 NOTE — Progress Notes (Signed)
2nd session ECST Thoracic compression fracture 5-10 Hz Power 60J

## 2022-05-16 DIAGNOSIS — M1990 Unspecified osteoarthritis, unspecified site: Secondary | ICD-10-CM | POA: Diagnosis not present

## 2022-05-16 DIAGNOSIS — M8008XD Age-related osteoporosis with current pathological fracture, vertebra(e), subsequent encounter for fracture with routine healing: Secondary | ICD-10-CM | POA: Diagnosis not present

## 2022-05-16 DIAGNOSIS — Z9181 History of falling: Secondary | ICD-10-CM | POA: Diagnosis not present

## 2022-05-16 DIAGNOSIS — G629 Polyneuropathy, unspecified: Secondary | ICD-10-CM | POA: Diagnosis not present

## 2022-05-16 DIAGNOSIS — Z87891 Personal history of nicotine dependence: Secondary | ICD-10-CM | POA: Diagnosis not present

## 2022-05-16 DIAGNOSIS — Z7952 Long term (current) use of systemic steroids: Secondary | ICD-10-CM | POA: Diagnosis not present

## 2022-05-16 DIAGNOSIS — I10 Essential (primary) hypertension: Secondary | ICD-10-CM | POA: Diagnosis not present

## 2022-05-20 ENCOUNTER — Encounter: Payer: PPO | Admitting: Physical Medicine and Rehabilitation

## 2022-05-20 ENCOUNTER — Encounter: Payer: Self-pay | Admitting: Physical Medicine and Rehabilitation

## 2022-05-20 VITALS — BP 129/61 | HR 66 | Ht 61.0 in | Wt 108.0 lb

## 2022-05-20 DIAGNOSIS — Z5181 Encounter for therapeutic drug level monitoring: Secondary | ICD-10-CM

## 2022-05-20 DIAGNOSIS — G894 Chronic pain syndrome: Secondary | ICD-10-CM

## 2022-05-20 DIAGNOSIS — Z79899 Other long term (current) drug therapy: Secondary | ICD-10-CM

## 2022-05-20 MED ORDER — HYDROCODONE-ACETAMINOPHEN 5-325 MG PO TABS: 1.0000 | ORAL_TABLET | Freq: Two times a day (BID) | ORAL | 0 refills | Status: DC | PRN

## 2022-05-20 MED ORDER — BACLOFEN 5 MG PO TABS: 1.0000 | ORAL_TABLET | Freq: Every day | ORAL | 3 refills | Status: DC | PRN

## 2022-05-20 NOTE — Progress Notes (Signed)
3rd session ECST Thoracic compression fracture 5-10 Hz Power 60J

## 2022-05-23 DIAGNOSIS — I10 Essential (primary) hypertension: Secondary | ICD-10-CM | POA: Diagnosis not present

## 2022-05-23 DIAGNOSIS — Z87891 Personal history of nicotine dependence: Secondary | ICD-10-CM | POA: Diagnosis not present

## 2022-05-23 DIAGNOSIS — M8008XD Age-related osteoporosis with current pathological fracture, vertebra(e), subsequent encounter for fracture with routine healing: Secondary | ICD-10-CM | POA: Diagnosis not present

## 2022-05-23 DIAGNOSIS — Z7952 Long term (current) use of systemic steroids: Secondary | ICD-10-CM | POA: Diagnosis not present

## 2022-05-23 DIAGNOSIS — Z9181 History of falling: Secondary | ICD-10-CM | POA: Diagnosis not present

## 2022-05-23 DIAGNOSIS — M1990 Unspecified osteoarthritis, unspecified site: Secondary | ICD-10-CM | POA: Diagnosis not present

## 2022-05-23 DIAGNOSIS — G629 Polyneuropathy, unspecified: Secondary | ICD-10-CM | POA: Diagnosis not present

## 2022-05-23 LAB — TOXASSURE SELECT,+ANTIDEPR,UR

## 2022-05-27 ENCOUNTER — Encounter: Payer: PPO | Admitting: Physical Medicine and Rehabilitation

## 2022-05-27 ENCOUNTER — Encounter: Payer: Self-pay | Admitting: Physical Medicine and Rehabilitation

## 2022-05-27 ENCOUNTER — Telehealth: Payer: Self-pay | Admitting: *Deleted

## 2022-05-27 VITALS — BP 138/67 | HR 80 | Ht 61.0 in | Wt 105.0 lb

## 2022-05-27 DIAGNOSIS — S22000A Wedge compression fracture of unspecified thoracic vertebra, initial encounter for closed fracture: Secondary | ICD-10-CM

## 2022-05-27 NOTE — Telephone Encounter (Signed)
Urine drug screen for this encounter is consistent for prescribed medication 

## 2022-05-27 NOTE — Progress Notes (Signed)
4th session ECST Thoracic compression fracture 5-10 Hz Power 60J

## 2022-06-05 ENCOUNTER — Encounter: Payer: Self-pay | Admitting: Physical Medicine and Rehabilitation

## 2022-06-05 ENCOUNTER — Encounter: Payer: PPO | Admitting: Physical Medicine and Rehabilitation

## 2022-06-05 VITALS — BP 158/84 | HR 62 | Ht 61.0 in | Wt 108.0 lb

## 2022-06-05 DIAGNOSIS — S22000A Wedge compression fracture of unspecified thoracic vertebra, initial encounter for closed fracture: Secondary | ICD-10-CM

## 2022-06-05 NOTE — Progress Notes (Signed)
5th session ECST Thoracic compression fracture 5-10 Hz Power 60J

## 2022-06-12 ENCOUNTER — Encounter: Payer: PPO | Attending: Physical Medicine and Rehabilitation | Admitting: Physical Medicine and Rehabilitation

## 2022-06-12 ENCOUNTER — Encounter: Payer: Self-pay | Admitting: Physical Medicine and Rehabilitation

## 2022-06-12 VITALS — BP 130/68 | HR 65 | Temp 98.8°F | Ht 61.0 in | Wt 105.0 lb

## 2022-06-12 DIAGNOSIS — G43809 Other migraine, not intractable, without status migrainosus: Secondary | ICD-10-CM | POA: Insufficient documentation

## 2022-06-12 NOTE — Progress Notes (Signed)
6th session ECST Thoracic compression fracture 5-10 Hz Power 60J

## 2022-07-01 ENCOUNTER — Other Ambulatory Visit: Payer: Self-pay | Admitting: *Deleted

## 2022-07-01 MED ORDER — HYDROCODONE-ACETAMINOPHEN 5-325 MG PO TABS: 1.0000 | ORAL_TABLET | Freq: Two times a day (BID) | ORAL | 0 refills | Status: DC | PRN

## 2022-07-03 DIAGNOSIS — Z79899 Other long term (current) drug therapy: Secondary | ICD-10-CM | POA: Diagnosis not present

## 2022-07-03 DIAGNOSIS — H43813 Vitreous degeneration, bilateral: Secondary | ICD-10-CM | POA: Diagnosis not present

## 2022-07-03 DIAGNOSIS — H35033 Hypertensive retinopathy, bilateral: Secondary | ICD-10-CM | POA: Diagnosis not present

## 2022-07-17 DIAGNOSIS — I129 Hypertensive chronic kidney disease with stage 1 through stage 4 chronic kidney disease, or unspecified chronic kidney disease: Secondary | ICD-10-CM | POA: Diagnosis not present

## 2022-07-17 DIAGNOSIS — N179 Acute kidney failure, unspecified: Secondary | ICD-10-CM | POA: Diagnosis not present

## 2022-07-17 DIAGNOSIS — N184 Chronic kidney disease, stage 4 (severe): Secondary | ICD-10-CM | POA: Diagnosis not present

## 2022-07-17 DIAGNOSIS — N189 Chronic kidney disease, unspecified: Secondary | ICD-10-CM | POA: Diagnosis not present

## 2022-07-17 DIAGNOSIS — N2581 Secondary hyperparathyroidism of renal origin: Secondary | ICD-10-CM | POA: Diagnosis not present

## 2022-07-17 DIAGNOSIS — E875 Hyperkalemia: Secondary | ICD-10-CM | POA: Diagnosis not present

## 2022-07-17 DIAGNOSIS — D631 Anemia in chronic kidney disease: Secondary | ICD-10-CM | POA: Diagnosis not present

## 2022-07-18 ENCOUNTER — Encounter: Payer: PPO | Attending: Physical Medicine and Rehabilitation | Admitting: Physical Medicine and Rehabilitation

## 2022-07-18 ENCOUNTER — Encounter: Payer: Self-pay | Admitting: Physical Medicine and Rehabilitation

## 2022-07-18 VITALS — BP 133/81 | HR 60 | Ht 61.0 in | Wt 106.0 lb

## 2022-07-18 DIAGNOSIS — S22000A Wedge compression fracture of unspecified thoracic vertebra, initial encounter for closed fracture: Secondary | ICD-10-CM | POA: Diagnosis not present

## 2022-07-18 DIAGNOSIS — K5900 Constipation, unspecified: Secondary | ICD-10-CM | POA: Insufficient documentation

## 2022-07-18 DIAGNOSIS — D649 Anemia, unspecified: Secondary | ICD-10-CM | POA: Diagnosis not present

## 2022-07-18 DIAGNOSIS — M8000XA Age-related osteoporosis with current pathological fracture, unspecified site, initial encounter for fracture: Secondary | ICD-10-CM | POA: Diagnosis not present

## 2022-07-18 DIAGNOSIS — G588 Other specified mononeuropathies: Secondary | ICD-10-CM | POA: Diagnosis not present

## 2022-07-18 DIAGNOSIS — G43809 Other migraine, not intractable, without status migrainosus: Secondary | ICD-10-CM | POA: Diagnosis not present

## 2022-07-18 MED ORDER — HYDROCODONE-ACETAMINOPHEN 5-325 MG PO TABS: 1.0000 | ORAL_TABLET | Freq: Two times a day (BID) | ORAL | 0 refills | Status: DC | PRN

## 2022-07-18 NOTE — Progress Notes (Signed)
Subjective:    Patient ID: Holly Jimenez, female    DOB: 1945/02/19, 77 y.o.   MRN: [id number]  HPI: Holly Jimenez is a 77 y.o. female who returns for follow up appointment for chronic pain and medication refill. She states her pain is located in her lower back. She rates her pain 7. Her current exercise regime is walking and performing stretching exercises.  1) Compression fracture: -she has had good relief from the [facility] -pain is improved but still present [name] Morphine equivalent is 5.00 MME.She is also prescribed Alprazolam  by Dr. Thomasena Edis .We have discussed the black box warning of using opioids and benzodiazepines. I highlighted the dangers of using these drugs together and discussed the adverse events including respiratory suppression, overdose, cognitive impairment and importance of compliance with current regimen. We will continue to monitor and adjust as indicated.  she is being closely monitored and under the care of her psychiatrist.   Last UDS was Performed on 10/22/2021, it was consistent.   She does not find the lyrica helpful   Hydrocodone helps  Lumbar spine pain improved tremendously  Thoracic spine pain is more noticeable.   Received a pain journal  2) Constipation: -does have constipation sometimes from the opioids  3) Anemia: -asks if this can cause thinning hair  4) Migraine -stable  5) Intercostal neuralgia: -stable   Pain Inventory Average Pain 8 Pain Right Now 4 My pain is sharp, stabbing, and aching  In the last 24 hours, has pain interfered with the following? General activity 7 Relation with others 0 Enjoyment of life 9 What TIME of day is your pain at its worst? daytime and evening Sleep (in general) Good  Pain is worse with: standing/some activities Pain improves with: rest, heat/ice, and medication Relief from Meds: 5  Family History  Problem Relation Age of Onset   Ovarian cancer Sister     Colon cancer Sister    Heart disease Paternal Grandfather    Heart disease Maternal Grandfather    Kidney disease Brother    Kidney disease Mother    Hypertension Mother    Kidney disease Maternal Grandmother    Diabetes Paternal Grandmother    Asthma Father    Suicidality Father    Alcoholism Father    Social History   Socioeconomic History   Marital status: Divorced    Spouse name: Not on file   Number of children: 1   Years of education: Associates   Highest education level: Not on file  Occupational History   Occupation: retired    Associate Professor: [employer]  Tobacco Use   Smoking status: Former    Current packs/day: 0.00    Average packs/day: 1 pack/day for 25.0 years (25.0 ttl pk-yrs)    Types: Cigarettes    Start date: 03/06/1975    Quit date: 03/05/2000    Years since quitting: 22.3   Smokeless tobacco: Never  Vaping Use   Vaping status: Never Used  Substance and Sexual Activity   Alcohol use: Yes    Comment: Rare   Drug use: No    Frequency: 2.0 times per week    Types: Marijuana    Comment: previous use of marijuana for pain   Sexual activity: Not on file  Other Topics Concern   Not on file  Social History Narrative   Lives at home alone.   Right-handed.   Rare use of caffeine.   Social Determinants of Health   Financial Resource Strain:  Not on file  Food Insecurity: Not on file  Transportation Needs: Not on file  Physical Activity: Not on file  Stress: Not on file  Social Connections: Not on file   Past Surgical History:  Procedure Laterality Date   ABDOMINAL HYSTERECTOMY  1975   APPENDECTOMY  1972   FOOT SURGERY     LEFT   KYPHOPLASTY Bilateral 03/08/2014   Procedure: Thoracic nine Kyphoplasty;  Surgeon: Coletta Memos, MD;  Location: [facility];  Service: Neurosurgery;  Laterality: Bilateral;  Thoracic nine Kyphoplasty   LIPOMA EXCISION     back   TONSILLECTOMY     WRIST GANGLION EXCISION     left   Past Surgical History:  Procedure  Laterality Date   ABDOMINAL HYSTERECTOMY  1975   APPENDECTOMY  1972   FOOT SURGERY     LEFT   KYPHOPLASTY Bilateral 03/08/2014   Procedure: Thoracic nine Kyphoplasty;  Surgeon: Coletta Memos, MD;  Location: [facility];  Service: Neurosurgery;  Laterality: Bilateral;  Thoracic nine Kyphoplasty   LIPOMA EXCISION     back   TONSILLECTOMY     WRIST GANGLION EXCISION     left   Past Medical History:  Diagnosis Date   Abnormal LFTs    Allergic rhinitis    Anemia    Anxiety    Arthritis    Chronic back pain    Chronic headaches    Chronic kidney disease, unspecified    DDD (degenerative disc disease)    GERD (gastroesophageal reflux disease)    Headache    Hypercalcemia    Hyperkalemia    Hyperlipidemia    Hypertension    KIDNEY SPECIALIST TOOK OFF BENICAR   IBS (irritable bowel syndrome)    Lupus (HCC)    Mixed connective tissue disease (HCC)    Nausea    Neuropathy    Peptic ulcer disease    Raynaud's disease    Small kidney    left   Vitamin D deficiency disease    BP 133/81   Pulse 60   Ht 5\' 1"  (1.549 m)   Wt 106 lb (48.1 kg)   SpO2 97%   BMI 20.03 kg/m   Opioid Risk Score:   Fall Risk Score:  `1  Depression screen [facility] 2/9     06/12/2022   11:36 AM 05/20/2022   11:09 AM 05/13/2022   11:44 AM 03/25/2022   11:16 AM 02/27/2022   11:16 AM 02/04/2022   11:40 AM 01/28/2022   11:29 AM  Depression screen PHQ 2/9  Decreased Interest 0 0 0 0 0 0 0  Down, Depressed, Hopeless 0 0 0 0 0 0 0  PHQ - 2 Score 0 0 0 0 0 0 0  Altered sleeping       0  PHQ-9 Score       0       Review of Systems  Musculoskeletal:  Positive for neck pain.  All other systems reviewed and are negative.     Objective:   Gen: no distress, normal appearing HEENT: oral mucosa pink and moist, NCAT Cardio: Reg rate Chest: normal effort, normal rate of breathing Abd: soft, non-distended Ext: no edema Psych: pleasant, normal affect Skin: intact Neuro: Alert and oriented  x3 Musculoskeletal: 5/5 strength throughout     Assessment & Plan:  Lumbar Spinal Stenosis: Continue HEP as Tolerated . Continue to monitor. 11/14/2021 Chronic Low Back Pain without sciatica: Completed shockwave therapy with good results Continue HEP as Tolerated.  Continue current medication regimen. 11/14/2021 Intercostal Neuralgia:  No complaints today. Continue Lyrica. Continue to Monitor.11/14/2021 Chronic Pain Syndrome: Refilled  Hydrocodone 5mg /325 one tablet daily as needed for pain #30. Second script sent for the following month.  -We will continue the opioid monitoring program, this consists of regular clinic visits, examinations, urine drug screen, pill counts as well as use of [place] Controlled Substance Reporting system. A 12 month History has been reviewed on the [place] Controlled Substance Reporting System on 11/14/2021. Marland KitchenCervicalgia/ Cervical Radiculitis: Continue Lyrica. Continue HEP as tolerated. Continue current medication regimen. Continue to monitor. 11/14/2021 Left Shoulder Tendonitis:  Continue to alternate Ice and Heat Therapy. Continue to Monitor. 11/14/2021 Thoracic spine pain: discussed and is stable  Migraines: discussed and are stable   Right hip pain: recommended red light therapy, Percocet increased to BID  11. Anemia: discussed food sources of iron  12. Intercostal neuralgia: discussed that this is stable  13. Constipation:  -Provided list of following foods that help with constipation and highlighted a few: 1) prunes- contain high amounts of fiber.  2) apples- has a form of dietary fiber called pectin that accelerates stool movement and increases beneficial gut bacteria 3) pears- in addition to fiber, also high in fructose and sorbitol which have laxative effect 4) figs- contain an enzyme ficin which helps to speed colonic transit 5) kiwis- contain an enzyme actinidin that improves gut motility and reduces constipation 6) oranges- rich in  pectin (like apples) 7) grapefruits- contain a flavanol naringenin which has a laxative effect 8) vegetables- rich in fiber and also great sources of folate, vitamin C, and K 9) artichoke- high in inulin, prebiotic great for the microbiome 10) chicory- increases stool frequency and softness (can be added to coffee) 11) rhubarb- laxative effect 12) sweet potato- high fiber 13) beans, peas, and lentils- contain both soluble and insoluble fiber 14) chia seeds- improves intestinal health and gut flora 15) flaxseeds- laxative effect 16) whole grain rye bread- high in fiber 17) oat bran- high in soluble and insoluble fiber 18) kefir- softens stools -recommended to try at least one of these foods every day.  -drink 6-8 glasses of water per day -walk regularly, especially after meals.        F/U in 2 months: This was discussed with Dr Carlis Abbott she agrees with every 2 month visits.

## 2022-07-18 NOTE — Patient Instructions (Signed)
Constipation:  -Provided list of following foods that help with constipation and highlighted a few: 1) prunes- contain high amounts of fiber.  2) apples- has a form of dietary fiber called pectin that accelerates stool movement and increases beneficial gut bacteria 3) pears- in addition to fiber, also high in fructose and sorbitol which have laxative effect 4) figs- contain an enzyme ficin which helps to speed colonic transit 5) kiwis- contain an enzyme actinidin that improves gut motility and reduces constipation 6) oranges- rich in pectin (like apples) 7) grapefruits- contain a flavanol naringenin which has a laxative effect 8) vegetables- rich in fiber and also great sources of folate, vitamin C, and K 9) artichoke- high in inulin, prebiotic great for the microbiome 10) chicory- increases stool frequency and softness (can be added to coffee) 11) rhubarb- laxative effect 12) sweet potato- high fiber 13) beans, peas, and lentils- contain both soluble and insoluble fiber 14) chia seeds- improves intestinal health and gut flora 15) flaxseeds- laxative effect 16) whole grain rye bread- high in fiber 17) oat bran- high in soluble and insoluble fiber 18) kefir- softens stools -recommended to try at least one of these foods every day.  -drink 6-8 glasses of water per day -walk regularly, especially after meals.      

## 2022-08-01 DIAGNOSIS — N184 Chronic kidney disease, stage 4 (severe): Secondary | ICD-10-CM | POA: Diagnosis not present

## 2022-08-05 DIAGNOSIS — Z13220 Encounter for screening for lipoid disorders: Secondary | ICD-10-CM | POA: Diagnosis not present

## 2022-08-05 DIAGNOSIS — R946 Abnormal results of thyroid function studies: Secondary | ICD-10-CM | POA: Diagnosis not present

## 2022-08-05 DIAGNOSIS — Z79899 Other long term (current) drug therapy: Secondary | ICD-10-CM | POA: Diagnosis not present

## 2022-08-05 DIAGNOSIS — R7309 Other abnormal glucose: Secondary | ICD-10-CM | POA: Diagnosis not present

## 2022-08-12 DIAGNOSIS — N1832 Chronic kidney disease, stage 3b: Secondary | ICD-10-CM | POA: Diagnosis not present

## 2022-08-12 DIAGNOSIS — K582 Mixed irritable bowel syndrome: Secondary | ICD-10-CM | POA: Diagnosis not present

## 2022-08-12 DIAGNOSIS — R519 Headache, unspecified: Secondary | ICD-10-CM | POA: Diagnosis not present

## 2022-08-12 DIAGNOSIS — L659 Nonscarring hair loss, unspecified: Secondary | ICD-10-CM | POA: Diagnosis not present

## 2022-08-12 DIAGNOSIS — I129 Hypertensive chronic kidney disease with stage 1 through stage 4 chronic kidney disease, or unspecified chronic kidney disease: Secondary | ICD-10-CM | POA: Diagnosis not present

## 2022-08-12 DIAGNOSIS — M5136 Other intervertebral disc degeneration, lumbar region: Secondary | ICD-10-CM | POA: Diagnosis not present

## 2022-08-12 DIAGNOSIS — M48062 Spinal stenosis, lumbar region with neurogenic claudication: Secondary | ICD-10-CM | POA: Diagnosis not present

## 2022-08-12 DIAGNOSIS — Z862 Personal history of diseases of the blood and blood-forming organs and certain disorders involving the immune mechanism: Secondary | ICD-10-CM | POA: Diagnosis not present

## 2022-08-12 DIAGNOSIS — Z0000 Encounter for general adult medical examination without abnormal findings: Secondary | ICD-10-CM | POA: Diagnosis not present

## 2022-08-12 DIAGNOSIS — R5383 Other fatigue: Secondary | ICD-10-CM | POA: Diagnosis not present

## 2022-08-14 ENCOUNTER — Encounter: Payer: Self-pay | Admitting: Registered Nurse

## 2022-08-14 ENCOUNTER — Encounter: Payer: PPO | Attending: Physical Medicine and Rehabilitation | Admitting: Registered Nurse

## 2022-08-14 VITALS — BP 133/69 | HR 60 | Ht 61.0 in | Wt 107.0 lb

## 2022-08-14 DIAGNOSIS — G894 Chronic pain syndrome: Secondary | ICD-10-CM

## 2022-08-14 DIAGNOSIS — Z5181 Encounter for therapeutic drug level monitoring: Secondary | ICD-10-CM | POA: Diagnosis not present

## 2022-08-14 DIAGNOSIS — M2550 Pain in unspecified joint: Secondary | ICD-10-CM

## 2022-08-14 DIAGNOSIS — Z79899 Other long term (current) drug therapy: Secondary | ICD-10-CM

## 2022-08-14 MED ORDER — HYDROCODONE-ACETAMINOPHEN 5-325 MG PO TABS: 1.0000 | ORAL_TABLET | Freq: Two times a day (BID) | ORAL | 0 refills | Status: DC | PRN

## 2022-08-14 NOTE — Progress Notes (Signed)
Subjective:    Patient ID: Holly Jimenez, female    DOB: 1945-03-26, 77 y.o.   MRN: [id number]  HPI: Holly Jimenez is a 77 y.o. female who returns for follow up appointment for chronic pain and medication refill. She states she has generalized joint pain. She denies any back pain at this time. She rates her pain 7. Her current exercise regime is walking and performing stretching exercises.  [name] Morphine equivalent is 10.00 MME.  She is also prescribed Alprazolam  by Dr. Thomasena Edis .We have discussed the black box warning of using opioids and benzodiazepines. I highlighted the dangers of using these drugs together and discussed the adverse events including respiratory suppression, overdose, cognitive impairment and importance of compliance with current regimen. We will continue to monitor and adjust as indicated.  .   Last UDS was Performed on 05/20/2022, it was consistent.      Pain Inventory Average Pain 8 Pain Right Now 7 My pain is constant and aching  In the last 24 hours, has pain interfered with the following? General activity 7 Relation with others 7 Enjoyment of life 8 What TIME of day is your pain at its worst? daytime Sleep (in general) Good  Pain is worse with: walking and standing Pain improves with: rest, heat/ice, and therapy/exercise Relief from Meds: 3  Family History  Problem Relation Age of Onset   Ovarian cancer Sister    Colon cancer Sister    Heart disease Paternal Grandfather    Heart disease Maternal Grandfather    Kidney disease Brother    Kidney disease Mother    Hypertension Mother    Kidney disease Maternal Grandmother    Diabetes Paternal Grandmother    Asthma Father    Suicidality Father    Alcoholism Father    Social History   Socioeconomic History   Marital status: Divorced    Spouse name: Not on file   Number of children: 1   Years of education: Associates   Highest education level: Not on file   Occupational History   Occupation: retired    Associate Professor: [employer]  Tobacco Use   Smoking status: Former    Current packs/day: 0.00    Average packs/day: 1 pack/day for 25.0 years (25.0 ttl pk-yrs)    Types: Cigarettes    Start date: 03/06/1975    Quit date: 03/05/2000    Years since quitting: 22.4   Smokeless tobacco: Never  Vaping Use   Vaping status: Never Used  Substance and Sexual Activity   Alcohol use: Yes    Comment: Rare   Drug use: No    Frequency: 2.0 times per week    Types: Marijuana    Comment: previous use of marijuana for pain   Sexual activity: Not on file  Other Topics Concern   Not on file  Social History Narrative   Lives at home alone.   Right-handed.   Rare use of caffeine.   Social Determinants of Health   Financial Resource Strain: Not on file  Food Insecurity: Not on file  Transportation Needs: Not on file  Physical Activity: Not on file  Stress: Not on file  Social Connections: Not on file   Past Surgical History:  Procedure Laterality Date   ABDOMINAL HYSTERECTOMY  1975   APPENDECTOMY  1972   FOOT SURGERY     LEFT   KYPHOPLASTY Bilateral 03/08/2014   Procedure: Thoracic nine Kyphoplasty;  Surgeon: Coletta Memos, MD;  Location: [facility];  Service: Neurosurgery;  Laterality: Bilateral;  Thoracic nine Kyphoplasty   LIPOMA EXCISION     back   TONSILLECTOMY     WRIST GANGLION EXCISION     left   Past Surgical History:  Procedure Laterality Date   ABDOMINAL HYSTERECTOMY  1975   APPENDECTOMY  1972   FOOT SURGERY     LEFT   KYPHOPLASTY Bilateral 03/08/2014   Procedure: Thoracic nine Kyphoplasty;  Surgeon: Coletta Memos, MD;  Location: [facility];  Service: Neurosurgery;  Laterality: Bilateral;  Thoracic nine Kyphoplasty   LIPOMA EXCISION     back   TONSILLECTOMY     WRIST GANGLION EXCISION     left   Past Medical History:  Diagnosis Date   Abnormal LFTs    Allergic rhinitis    Anemia    Anxiety    Arthritis     Chronic back pain    Chronic headaches    Chronic kidney disease, unspecified    DDD (degenerative disc disease)    GERD (gastroesophageal reflux disease)    Headache    Hypercalcemia    Hyperkalemia    Hyperlipidemia    Hypertension    KIDNEY SPECIALIST TOOK OFF BENICAR   IBS (irritable bowel syndrome)    Lupus (HCC)    Mixed connective tissue disease (HCC)    Nausea    Neuropathy    Peptic ulcer disease    Raynaud's disease    Small kidney    left   Vitamin D deficiency disease    BP 133/69   Pulse (!) 103   Ht 5\' 1"  (1.549 m)   Wt 107 lb (48.5 kg)   BMI 20.22 kg/m   Opioid Risk Score:   Fall Risk Score:  `1  Depression screen [facility] 2/9     08/14/2022   10:24 AM 06/12/2022   11:36 AM 05/20/2022   11:09 AM 05/13/2022   11:44 AM 03/25/2022   11:16 AM 02/27/2022   11:16 AM 02/04/2022   11:40 AM  Depression screen PHQ 2/9  Decreased Interest 0 0 0 0 0 0 0  Down, Depressed, Hopeless 0 0 0 0 0 0 0  PHQ - 2 Score 0 0 0 0 0 0 0   8 7  Review of Systems  Musculoskeletal:  Positive for arthralgias, gait problem and myalgias.  All other systems reviewed and are negative.      Objective:   Physical Exam Vitals and nursing note reviewed.  Constitutional:      Appearance: Normal appearance.  Cardiovascular:     Rate and Rhythm: Normal rate and regular rhythm.     Pulses: Normal pulses.     Heart sounds: Normal heart sounds.  Pulmonary:     Effort: Pulmonary effort is normal.     Breath sounds: Normal breath sounds.  Musculoskeletal:     Cervical back: Normal range of motion and neck supple.     Comments: Normal Muscle Bulk and Muscle Testing Reveals:  Upper Extremities: Decreased ROM 45 Degrees  and Muscle Strength 5/5  Lower Extremities: Full ROM and Muscle Strength 5/5 Arises from Table with Ease Narrow Based  Gait     Skin:    General: Skin is warm and dry.  Neurological:     Mental Status: She is alert and oriented to person, place, and time.  Psychiatric:         Mood and Affect: Mood normal.        Behavior: Behavior normal.  Assessment & Plan:  Lumbar Spinal Stenosis: . Continue HEP as Tolerated . Continue to monitor. 08/14/2022 Chronic Low Back Pain without sciatica: No complaint Today. . Continue HEP as Tolerated. Continue current medication regimen. 08/14/2022 Intercostal Neuralgia:  No complaints today. Continue Lyrica. Continue to Monitor.08/14/2022 Chronic Pain Syndrome: Refilled Hydrocodone 5mg /325 one tablet twice a day  as needed for pain #60. Second script sent for the following month.  We will continue the opioid monitoring program, this consists of regular clinic visits, examinations, urine drug screen, pill counts as well as use of [place] Controlled Substance Reporting system. A 12 month History has been reviewed on the [place] Controlled Substance Reporting System on 08/14/2022. Marland KitchenCervicalgia/ Cervical Radiculitis: No complaints today. Continue Lyrica. Continue HEP as tolerated. Continue current medication regimen. Continue to monitor. 08/14/2022 Left Shoulder Tendonitis:  No Complaints today. Continue to alternate Ice and Heat Therapy. Continue to Monitor. 08/14/2022 8. Polyarthralgia: Continue HEP as Tolerated and Continue to Monitor. 08/14/2022   F/U in 2 months: This was discussed with Dr Carlis Abbott she agrees with every 2 month visits.

## 2022-09-11 ENCOUNTER — Telehealth: Payer: Self-pay

## 2022-09-11 MED ORDER — HYDROCODONE-ACETAMINOPHEN 5-325 MG PO TABS: 1.0000 | ORAL_TABLET | Freq: Two times a day (BID) | ORAL | 0 refills | Status: DC | PRN

## 2022-09-11 NOTE — Telephone Encounter (Signed)
PMP was Reviewed.  Hydrocodone e-scribed to pharmacy.

## 2022-09-11 NOTE — Telephone Encounter (Signed)
[facility] called in advising [facility] are merging [facility] and they are requesting a new Rx be sent for the hydrocodone to them

## 2022-09-16 DIAGNOSIS — M1990 Unspecified osteoarthritis, unspecified site: Secondary | ICD-10-CM | POA: Diagnosis not present

## 2022-09-16 DIAGNOSIS — R1010 Upper abdominal pain, unspecified: Secondary | ICD-10-CM | POA: Diagnosis not present

## 2022-09-16 DIAGNOSIS — M3589 Other specified systemic involvement of connective tissue: Secondary | ICD-10-CM | POA: Diagnosis not present

## 2022-09-16 DIAGNOSIS — Z23 Encounter for immunization: Secondary | ICD-10-CM | POA: Diagnosis not present

## 2022-09-16 DIAGNOSIS — M549 Dorsalgia, unspecified: Secondary | ICD-10-CM | POA: Diagnosis not present

## 2022-09-16 DIAGNOSIS — M5136 Other intervertebral disc degeneration, lumbar region: Secondary | ICD-10-CM | POA: Diagnosis not present

## 2022-09-16 DIAGNOSIS — N183 Chronic kidney disease, stage 3 unspecified: Secondary | ICD-10-CM | POA: Diagnosis not present

## 2022-09-16 DIAGNOSIS — D649 Anemia, unspecified: Secondary | ICD-10-CM | POA: Diagnosis not present

## 2022-09-16 DIAGNOSIS — M810 Age-related osteoporosis without current pathological fracture: Secondary | ICD-10-CM | POA: Diagnosis not present

## 2022-09-16 DIAGNOSIS — R0781 Pleurodynia: Secondary | ICD-10-CM | POA: Diagnosis not present

## 2022-09-16 DIAGNOSIS — Z79899 Other long term (current) drug therapy: Secondary | ICD-10-CM | POA: Diagnosis not present

## 2022-10-21 DIAGNOSIS — N189 Chronic kidney disease, unspecified: Secondary | ICD-10-CM | POA: Diagnosis not present

## 2022-10-21 DIAGNOSIS — D631 Anemia in chronic kidney disease: Secondary | ICD-10-CM | POA: Diagnosis not present

## 2022-10-21 DIAGNOSIS — N184 Chronic kidney disease, stage 4 (severe): Secondary | ICD-10-CM | POA: Diagnosis not present

## 2022-10-21 DIAGNOSIS — I129 Hypertensive chronic kidney disease with stage 1 through stage 4 chronic kidney disease, or unspecified chronic kidney disease: Secondary | ICD-10-CM | POA: Diagnosis not present

## 2022-10-21 DIAGNOSIS — N179 Acute kidney failure, unspecified: Secondary | ICD-10-CM | POA: Diagnosis not present

## 2022-10-23 ENCOUNTER — Encounter: Payer: Self-pay | Admitting: Registered Nurse

## 2022-10-23 ENCOUNTER — Encounter: Payer: PPO | Attending: Physical Medicine and Rehabilitation | Admitting: Registered Nurse

## 2022-10-23 VITALS — BP 131/69 | HR 60 | Ht 61.0 in | Wt 103.0 lb

## 2022-10-23 DIAGNOSIS — Z79899 Other long term (current) drug therapy: Secondary | ICD-10-CM | POA: Diagnosis not present

## 2022-10-23 DIAGNOSIS — M2550 Pain in unspecified joint: Secondary | ICD-10-CM | POA: Insufficient documentation

## 2022-10-23 DIAGNOSIS — M546 Pain in thoracic spine: Secondary | ICD-10-CM | POA: Insufficient documentation

## 2022-10-23 DIAGNOSIS — G8929 Other chronic pain: Secondary | ICD-10-CM | POA: Insufficient documentation

## 2022-10-23 DIAGNOSIS — M545 Low back pain, unspecified: Secondary | ICD-10-CM | POA: Diagnosis not present

## 2022-10-23 DIAGNOSIS — G894 Chronic pain syndrome: Secondary | ICD-10-CM | POA: Diagnosis not present

## 2022-10-23 DIAGNOSIS — Z5181 Encounter for therapeutic drug level monitoring: Secondary | ICD-10-CM | POA: Insufficient documentation

## 2022-10-23 MED ORDER — HYDROCODONE-ACETAMINOPHEN 5-325 MG PO TABS: 1.0000 | ORAL_TABLET | Freq: Two times a day (BID) | ORAL | 0 refills | Status: DC | PRN

## 2022-10-23 NOTE — Progress Notes (Signed)
Subjective:    Patient ID: Holly Jimenez, female    DOB: 21-Feb-1945, 77 y.o.   MRN: [id number]  HPI: Holly WHEELDON is a 77 y.o. female who returns for follow up appointment for chronic pain and medication refill. She states her pain is located in her upper- lower back. She rates her pain 7. Her current exercise regime is walking and performing stretching exercises.   Ms. Carey Bullocks- Peace Morphine equivalent is 10.00 MME.   UDS ordered today  Pain Inventory Average Pain 9 Pain Right Now 7 My pain is sharp and aching  In the last 24 hours, has pain interfered with the following? General activity 9 Relation with others 9 Enjoyment of life 9 What TIME of day is your pain at its worst? morning , daytime, evening, and night Sleep (in general) Fair  Pain is worse with: walking, sitting, inactivity, standing, and some activites Pain improves with: heat/ice and medication Relief from Meds: 5  Family History  Problem Relation Age of Onset   Ovarian cancer Sister    Colon cancer Sister    Heart disease Paternal Grandfather    Heart disease Maternal Grandfather    Kidney disease Brother    Kidney disease Mother    Hypertension Mother    Kidney disease Maternal Grandmother    Diabetes Paternal Grandmother    Asthma Father    Suicidality Father    Alcoholism Father    Social History   Socioeconomic History   Marital status: Divorced    Spouse name: Not on file   Number of children: 1   Years of education: Associates   Highest education level: Not on file  Occupational History   Occupation: retired    Associate Professor: [employer]  Tobacco Use   Smoking status: Former    Current packs/day: 0.00    Average packs/day: 1 pack/day for 25.0 years (25.0 ttl pk-yrs)    Types: Cigarettes    Start date: 03/06/1975    Quit date: 03/05/2000    Years since quitting: 22.6   Smokeless tobacco: Never  Vaping Use   Vaping status: Never Used  Substance and Sexual  Activity   Alcohol use: Yes    Comment: Rare   Drug use: No    Frequency: 2.0 times per week    Types: Marijuana    Comment: previous use of marijuana for pain   Sexual activity: Not on file  Other Topics Concern   Not on file  Social History Narrative   Lives at home alone.   Right-handed.   Rare use of caffeine.   Social Determinants of Health   Financial Resource Strain: Not on file  Food Insecurity: Not on file  Transportation Needs: Not on file  Physical Activity: Not on file  Stress: Not on file  Social Connections: Not on file   Past Surgical History:  Procedure Laterality Date   ABDOMINAL HYSTERECTOMY  1975   APPENDECTOMY  1972   FOOT SURGERY     LEFT   KYPHOPLASTY Bilateral 03/08/2014   Procedure: Thoracic nine Kyphoplasty;  Surgeon: Coletta Memos, MD;  Location: [facility];  Service: Neurosurgery;  Laterality: Bilateral;  Thoracic nine Kyphoplasty   LIPOMA EXCISION     back   TONSILLECTOMY     WRIST GANGLION EXCISION     left   Past Surgical History:  Procedure Laterality Date   ABDOMINAL HYSTERECTOMY  1975   APPENDECTOMY  1972   FOOT SURGERY     LEFT  KYPHOPLASTY Bilateral 03/08/2014   Procedure: Thoracic nine Kyphoplasty;  Surgeon: Coletta Memos, MD;  Location: [facility];  Service: Neurosurgery;  Laterality: Bilateral;  Thoracic nine Kyphoplasty   LIPOMA EXCISION     back   TONSILLECTOMY     WRIST GANGLION EXCISION     left   Past Medical History:  Diagnosis Date   Abnormal LFTs    Allergic rhinitis    Anemia    Anxiety    Arthritis    Chronic back pain    Chronic headaches    Chronic kidney disease, unspecified    DDD (degenerative disc disease)    GERD (gastroesophageal reflux disease)    Headache    Hypercalcemia    Hyperkalemia    Hyperlipidemia    Hypertension    KIDNEY SPECIALIST TOOK OFF BENICAR   IBS (irritable bowel syndrome)    Lupus (HCC)    Mixed connective tissue disease (HCC)    Nausea    Neuropathy    Peptic ulcer  disease    Raynaud's disease    Small kidney    left   Vitamin D deficiency disease    There were no vitals taken for this visit.  Opioid Risk Score:   Fall Risk Score:  `1  Depression screen [facility] 2/9     08/14/2022   10:24 AM 06/12/2022   11:36 AM 05/20/2022   11:09 AM 05/13/2022   11:44 AM 03/25/2022   11:16 AM 02/27/2022   11:16 AM 02/04/2022   11:40 AM  Depression screen PHQ 2/9  Decreased Interest 0 0 0 0 0 0 0  Down, Depressed, Hopeless 0 0 0 0 0 0 0  PHQ - 2 Score 0 0 0 0 0 0 0      Review of Systems  Musculoskeletal:  Positive for back pain and gait problem.  All other systems reviewed and are negative.     Objective:   Physical Exam Vitals and nursing note reviewed.  Constitutional:      Appearance: Normal appearance.  Cardiovascular:     Rate and Rhythm: Normal rate and regular rhythm.     Pulses: Normal pulses.     Heart sounds: Normal heart sounds.  Pulmonary:     Effort: Pulmonary effort is normal.     Breath sounds: Normal breath sounds.  Musculoskeletal:     Cervical back: Normal range of motion and neck supple.     Comments: Normal Muscle Bulk and Muscle Testing Reveals:  Upper Extremities: Decreased ROM 90 Degrees  and Muscle Strength 5/5 Lower Extremities: Full ROM and Muscle Strength 5/5 Arises from Table slowly using cane for support Narrow Based  Gait     Skin:    General: Skin is warm and dry.  Neurological:     Mental Status: She is alert and oriented to person, place, and time.  Psychiatric:        Mood and Affect: Mood normal.        Behavior: Behavior normal.         Assessment & Plan:  Lumbar Spinal Stenosis: . Continue HEP as Tolerated . Continue to monitor. 10/24/2022 Chronic Low Back Pain without sciatica: No complaint Today. . Continue HEP as Tolerated. Continue current medication regimen. 10/24/2022 Intercostal Neuralgia:  No complaints today. Continue Lyrica. Continue to Monitor.10/24/2022 Chronic Pain Syndrome: Refilled  Hydrocodone 5mg /325 one tablet twice a day  as needed for pain #60. Second script sent for the following month.  We will continue the  opioid monitoring program, this consists of regular clinic visits, examinations, urine drug screen, pill counts as well as use of [place] Controlled Substance Reporting system. A 12 month History has been reviewed on the [place] Controlled Substance Reporting System on 10/24/2022. Marland KitchenCervicalgia/ Cervical Radiculitis: No complaints today. Continue Lyrica. Continue HEP as tolerated. Continue current medication regimen. Continue to monitor. 10/24/2022 Left Shoulder Tendonitis:  No Complaints today. Continue to alternate Ice and Heat Therapy. Continue to Monitor. 10/24/2022 8. Polyarthralgia: Continue HEP as Tolerated and Continue to Monitor. 10/24/2022   F/U in 2 months: This was discussed with Dr Carlis Abbott she agrees with every 2 month visits.

## 2022-10-24 ENCOUNTER — Encounter: Payer: Self-pay | Admitting: Registered Nurse

## 2022-10-28 LAB — DRUG TOX MONITOR 1 W/CONF, ORAL FLD

## 2022-10-28 LAB — DRUG TOX ALC METAB W/CON, ORAL FLD: Alcohol Metabolite: NEGATIVE ng/mL (ref <25)

## 2022-12-12 ENCOUNTER — Telehealth: Payer: Self-pay

## 2022-12-12 MED ORDER — HYDROCODONE-ACETAMINOPHEN 5-325 MG PO TABS: 1.0000 | ORAL_TABLET | Freq: Two times a day (BID) | ORAL | 0 refills | Status: DC | PRN

## 2022-12-12 NOTE — Telephone Encounter (Signed)
Patient requesting refill for hydrocodone be sent to [facility] last fill per pmp  11/10/2022 09/11/2022 1  Hydrocodone-Acetamin 5-325 Mg 60.00 30 Eu Tho [id number] [facility] (3635) 0/0 10.00 MME Comm Ins [place]

## 2022-12-12 NOTE — Telephone Encounter (Signed)
PMP was Reviewed.  Hydrocodone e-scribed to pharmacy, [name] is aware via CMA conversation.

## 2022-12-24 ENCOUNTER — Other Ambulatory Visit: Payer: Self-pay

## 2022-12-24 ENCOUNTER — Encounter (HOSPITAL_COMMUNITY): Payer: Self-pay | Admitting: Emergency Medicine

## 2022-12-24 ENCOUNTER — Observation Stay (HOSPITAL_COMMUNITY): Payer: PPO

## 2022-12-24 ENCOUNTER — Emergency Department (HOSPITAL_COMMUNITY): Payer: PPO

## 2022-12-24 ENCOUNTER — Encounter (HOSPITAL_COMMUNITY): Payer: Self-pay

## 2022-12-24 ENCOUNTER — Ambulatory Visit (HOSPITAL_COMMUNITY)
Admission: EM | Admit: 2022-12-24 | Discharge: 2022-12-24 | Disposition: A | Discharge status: Home / Self Care | Payer: PPO

## 2022-12-24 ENCOUNTER — Inpatient Hospital Stay (HOSPITAL_COMMUNITY)
Admission: EM | Admit: 2022-12-24 | Discharge: 2022-12-30 | DRG: 521 | Disposition: A | Discharge status: Skilled Nursing Facility | Payer: PPO | Attending: Internal Medicine | Admitting: Internal Medicine

## 2022-12-24 DIAGNOSIS — Y92000 Kitchen of unspecified non-institutional (private) residence as  the place of occurrence of the external cause: Secondary | ICD-10-CM

## 2022-12-24 DIAGNOSIS — Z681 Body mass index (BMI) 19 or less, adult: Secondary | ICD-10-CM

## 2022-12-24 DIAGNOSIS — I10 Essential (primary) hypertension: Secondary | ICD-10-CM | POA: Diagnosis present

## 2022-12-24 DIAGNOSIS — E559 Vitamin D deficiency, unspecified: Secondary | ICD-10-CM | POA: Diagnosis present

## 2022-12-24 DIAGNOSIS — Z7982 Long term (current) use of aspirin: Secondary | ICD-10-CM

## 2022-12-24 DIAGNOSIS — K289 Gastrojejunal ulcer, unspecified as acute or chronic, without hemorrhage or perforation: Secondary | ICD-10-CM | POA: Diagnosis not present

## 2022-12-24 DIAGNOSIS — I6782 Cerebral ischemia: Secondary | ICD-10-CM | POA: Diagnosis not present

## 2022-12-24 DIAGNOSIS — I7300 Raynaud's syndrome without gangrene: Secondary | ICD-10-CM | POA: Diagnosis present

## 2022-12-24 DIAGNOSIS — S72012A Unspecified intracapsular fracture of left femur, initial encounter for closed fracture: Principal | ICD-10-CM | POA: Diagnosis present

## 2022-12-24 DIAGNOSIS — Z7952 Long term (current) use of systemic steroids: Secondary | ICD-10-CM

## 2022-12-24 DIAGNOSIS — W19XXXA Unspecified fall, initial encounter: Secondary | ICD-10-CM | POA: Diagnosis not present

## 2022-12-24 DIAGNOSIS — S72001A Fracture of unspecified part of neck of right femur, initial encounter for closed fracture: Secondary | ICD-10-CM | POA: Insufficient documentation

## 2022-12-24 DIAGNOSIS — M25552 Pain in left hip: Secondary | ICD-10-CM

## 2022-12-24 DIAGNOSIS — D631 Anemia in chronic kidney disease: Secondary | ICD-10-CM | POA: Diagnosis present

## 2022-12-24 DIAGNOSIS — E43 Unspecified severe protein-calorie malnutrition: Secondary | ICD-10-CM | POA: Insufficient documentation

## 2022-12-24 DIAGNOSIS — N1832 Chronic kidney disease, stage 3b: Secondary | ICD-10-CM | POA: Diagnosis not present

## 2022-12-24 DIAGNOSIS — Z888 Allergy status to other drugs, medicaments and biological substances status: Secondary | ICD-10-CM

## 2022-12-24 DIAGNOSIS — Z8041 Family history of malignant neoplasm of ovary: Secondary | ICD-10-CM

## 2022-12-24 DIAGNOSIS — Z90710 Acquired absence of both cervix and uterus: Secondary | ICD-10-CM

## 2022-12-24 DIAGNOSIS — D72829 Elevated white blood cell count, unspecified: Secondary | ICD-10-CM | POA: Diagnosis present

## 2022-12-24 DIAGNOSIS — Z800 Family history of malignant neoplasm of digestive organs: Secondary | ICD-10-CM

## 2022-12-24 DIAGNOSIS — R269 Unspecified abnormalities of gait and mobility: Secondary | ICD-10-CM

## 2022-12-24 DIAGNOSIS — E8721 Acute metabolic acidosis: Secondary | ICD-10-CM | POA: Diagnosis present

## 2022-12-24 DIAGNOSIS — S72002A Fracture of unspecified part of neck of left femur, initial encounter for closed fracture: Secondary | ICD-10-CM | POA: Diagnosis not present

## 2022-12-24 DIAGNOSIS — Z841 Family history of disorders of kidney and ureter: Secondary | ICD-10-CM

## 2022-12-24 DIAGNOSIS — G629 Polyneuropathy, unspecified: Secondary | ICD-10-CM | POA: Diagnosis present

## 2022-12-24 DIAGNOSIS — Z8249 Family history of ischemic heart disease and other diseases of the circulatory system: Secondary | ICD-10-CM

## 2022-12-24 DIAGNOSIS — Z882 Allergy status to sulfonamides status: Secondary | ICD-10-CM

## 2022-12-24 DIAGNOSIS — K219 Gastro-esophageal reflux disease without esophagitis: Secondary | ICD-10-CM | POA: Diagnosis present

## 2022-12-24 DIAGNOSIS — R270 Ataxia, unspecified: Secondary | ICD-10-CM | POA: Diagnosis not present

## 2022-12-24 DIAGNOSIS — Z825 Family history of asthma and other chronic lower respiratory diseases: Secondary | ICD-10-CM

## 2022-12-24 DIAGNOSIS — E785 Hyperlipidemia, unspecified: Secondary | ICD-10-CM | POA: Diagnosis present

## 2022-12-24 DIAGNOSIS — Z833 Family history of diabetes mellitus: Secondary | ICD-10-CM

## 2022-12-24 DIAGNOSIS — Z87891 Personal history of nicotine dependence: Secondary | ICD-10-CM

## 2022-12-24 DIAGNOSIS — I129 Hypertensive chronic kidney disease with stage 1 through stage 4 chronic kidney disease, or unspecified chronic kidney disease: Secondary | ICD-10-CM | POA: Diagnosis present

## 2022-12-24 DIAGNOSIS — M47816 Spondylosis without myelopathy or radiculopathy, lumbar region: Secondary | ICD-10-CM | POA: Diagnosis not present

## 2022-12-24 DIAGNOSIS — Z811 Family history of alcohol abuse and dependence: Secondary | ICD-10-CM

## 2022-12-24 DIAGNOSIS — Z79899 Other long term (current) drug therapy: Secondary | ICD-10-CM

## 2022-12-24 DIAGNOSIS — W010XXA Fall on same level from slipping, tripping and stumbling without subsequent striking against object, initial encounter: Secondary | ICD-10-CM | POA: Diagnosis present

## 2022-12-24 LAB — CBC WITH DIFFERENTIAL/PLATELET
Abs Immature Granulocytes: 0.03 10*3/uL (ref 0.00–0.07)
Basophils Absolute: 0 10*3/uL (ref 0.0–0.1)
Basophils Relative: 0 %
Eosinophils Absolute: 0.1 10*3/uL (ref 0.0–0.5)
Eosinophils Relative: 1 %
HCT: 35.6 % — ABNORMAL LOW (ref 36.0–46.0)
Hemoglobin: 11.2 g/dL — ABNORMAL LOW (ref 12.0–15.0)
Immature Granulocytes: 0 %
Lymphocytes Relative: 10 %
Lymphs Abs: 1.1 10*3/uL (ref 0.7–4.0)
MCH: 27.7 pg (ref 26.0–34.0)
MCHC: 31.5 g/dL (ref 30.0–36.0)
MCV: 88.1 fL (ref 80.0–100.0)
Monocytes Absolute: 0.5 10*3/uL (ref 0.1–1.0)
Monocytes Relative: 5 %
Neutro Abs: 8.5 10*3/uL — ABNORMAL HIGH (ref 1.7–7.7)
Neutrophils Relative %: 84 %
Platelets: 211 10*3/uL (ref 150–400)
RBC: 4.04 MIL/uL (ref 3.87–5.11)
RDW: 14 % (ref 11.5–15.5)
WBC: 10.2 10*3/uL (ref 4.0–10.5)
nRBC: 0 % (ref 0.0–0.2)

## 2022-12-24 LAB — PHOSPHORUS: Phosphorus: 3.2 mg/dL (ref 2.5–4.6)

## 2022-12-24 LAB — BASIC METABOLIC PANEL
Anion gap: 11 (ref 5–15)
BUN: 22 mg/dL (ref 8–23)
CO2: 19 mmol/L — ABNORMAL LOW (ref 22–32)
Calcium: 9.8 mg/dL (ref 8.9–10.3)
Chloride: 105 mmol/L (ref 98–111)
Creatinine, Ser: 1.49 mg/dL — ABNORMAL HIGH (ref 0.44–1.00)
GFR, Estimated: 36 mL/min — ABNORMAL LOW (ref >60)
Glucose, Bld: 103 mg/dL — ABNORMAL HIGH (ref 70–99)
Potassium: 4.1 mmol/L (ref 3.5–5.1)
Sodium: 135 mmol/L (ref 135–145)

## 2022-12-24 LAB — MAGNESIUM: Magnesium: 2.1 mg/dL (ref 1.7–2.4)

## 2022-12-24 MED ORDER — AMLODIPINE BESYLATE 5 MG PO TABS
5.0000 mg | ORAL_TABLET | Freq: Two times a day (BID) | ORAL | Status: DC
  Administered 2022-12-24 – 2022-12-25 (×2): 5 mg via ORAL
  Filled 2022-12-24 (×2): qty 1

## 2022-12-24 MED ORDER — SORBITOL 70 % SOLN
30.0000 mL | Freq: Every day | Status: DC | PRN
Start: 2022-12-24 — End: 2022-12-28

## 2022-12-24 MED ORDER — OXYBUTYNIN CHLORIDE 5 MG PO TABS
5.0000 mg | ORAL_TABLET | Freq: Two times a day (BID) | ORAL | Status: DC
  Administered 2022-12-24 – 2022-12-30 (×12): 5 mg via ORAL
  Filled 2022-12-24 (×12): qty 1

## 2022-12-24 MED ORDER — NAPHAZOLINE-PHENIRAMINE 0.025-0.3 % OP SOLN: 2.0000 [drp] | OPHTHALMIC | Status: DC | PRN

## 2022-12-24 MED ORDER — PREDNISONE 5 MG PO TABS
5.0000 mg | ORAL_TABLET | Freq: Every day | ORAL | Status: DC
  Administered 2022-12-25 – 2022-12-30 (×6): 5 mg via ORAL
  Filled 2022-12-24 (×6): qty 1

## 2022-12-24 MED ORDER — ACETAMINOPHEN 500 MG PO TABS
1000.0000 mg | ORAL_TABLET | Freq: Once | ORAL | Status: AC
  Administered 2022-12-24: 1000 mg via ORAL
  Filled 2022-12-24: qty 2

## 2022-12-24 MED ORDER — HYDROXYCHLOROQUINE SULFATE 200 MG PO TABS
200.0000 mg | ORAL_TABLET | Freq: Two times a day (BID) | ORAL | Status: DC
  Administered 2022-12-24 – 2022-12-30 (×12): 200 mg via ORAL
  Filled 2022-12-24 (×12): qty 1

## 2022-12-24 MED ORDER — PANTOPRAZOLE SODIUM 40 MG PO TBEC
40.0000 mg | DELAYED_RELEASE_TABLET | Freq: Every day | ORAL | Status: DC
  Administered 2022-12-24 – 2022-12-30 (×7): 40 mg via ORAL
  Filled 2022-12-24 (×3): qty 1
  Filled 2022-12-24: qty 2
  Filled 2022-12-24 (×3): qty 1

## 2022-12-24 MED ORDER — ALBUTEROL SULFATE (2.5 MG/3ML) 0.083% IN NEBU: 2.5000 mg | INHALATION_SOLUTION | RESPIRATORY_TRACT | Status: DC | PRN

## 2022-12-24 MED ORDER — ACETAMINOPHEN 325 MG PO TABS: 650.0000 mg | ORAL_TABLET | Freq: Four times a day (QID) | ORAL | Status: DC | PRN

## 2022-12-24 MED ORDER — HYDROMORPHONE HCL 1 MG/ML IJ SOLN
0.5000 mg | INTRAMUSCULAR | Status: DC | PRN
  Administered 2022-12-24 – 2022-12-27 (×4): 0.5 mg via INTRAVENOUS
  Filled 2022-12-24 (×5): qty 0.5

## 2022-12-24 MED ORDER — HYDROCODONE-ACETAMINOPHEN 5-325 MG PO TABS: 1.0000 | ORAL_TABLET | Freq: Two times a day (BID) | ORAL | Status: DC | PRN

## 2022-12-24 MED ORDER — VITAMIN B-12 1000 MCG PO TABS
1000.0000 ug | ORAL_TABLET | Freq: Every day | ORAL | Status: DC
  Administered 2022-12-24 – 2022-12-30 (×7): 1000 ug via ORAL
  Filled 2022-12-24 (×7): qty 1

## 2022-12-24 MED ORDER — ONDANSETRON HCL 4 MG/2ML IJ SOLN: 4.0000 mg | Freq: Four times a day (QID) | INTRAMUSCULAR | Status: DC | PRN

## 2022-12-24 MED ORDER — OXYCODONE HCL 5 MG PO TABS
5.0000 mg | ORAL_TABLET | ORAL | Status: DC | PRN
  Administered 2022-12-26 – 2022-12-29 (×7): 5 mg via ORAL
  Filled 2022-12-24 (×8): qty 1

## 2022-12-24 MED ORDER — LOSARTAN POTASSIUM 50 MG PO TABS
50.0000 mg | ORAL_TABLET | Freq: Every day | ORAL | Status: DC
  Administered 2022-12-25: 50 mg via ORAL
  Filled 2022-12-24: qty 1

## 2022-12-24 MED ORDER — MORPHINE SULFATE (PF) 4 MG/ML IV SOLN
4.0000 mg | Freq: Once | INTRAVENOUS | Status: AC
Start: 2022-12-24 — End: 2022-12-24
  Administered 2022-12-24: 4 mg via INTRAVENOUS
  Filled 2022-12-24: qty 1

## 2022-12-24 MED ORDER — RISAQUAD PO CAPS
1.0000 | ORAL_CAPSULE | Freq: Every day | ORAL | Status: DC
  Administered 2022-12-24 – 2022-12-30 (×7): 1 via ORAL
  Filled 2022-12-24 (×7): qty 1

## 2022-12-24 MED ORDER — FENTANYL CITRATE PF 50 MCG/ML IJ SOSY: 50.0000 ug | PREFILLED_SYRINGE | Freq: Once | INTRAMUSCULAR | Status: DC

## 2022-12-24 MED ORDER — FERROUS GLUCONATE 324 (38 FE) MG PO TABS
324.0000 mg | ORAL_TABLET | Freq: Two times a day (BID) | ORAL | Status: DC
  Administered 2022-12-24 – 2022-12-30 (×12): 324 mg via ORAL
  Filled 2022-12-24 (×13): qty 1

## 2022-12-24 MED ORDER — HEPARIN SODIUM (PORCINE) 5000 UNIT/ML IJ SOLN
5000.0000 [IU] | Freq: Three times a day (TID) | INTRAMUSCULAR | Status: DC
  Administered 2022-12-24 – 2022-12-29 (×13): 5000 [IU] via SUBCUTANEOUS
  Filled 2022-12-24 (×12): qty 1

## 2022-12-24 MED ORDER — LEFLUNOMIDE 10 MG PO TABS
10.0000 mg | ORAL_TABLET | Freq: Every day | ORAL | Status: DC
  Administered 2022-12-24 – 2022-12-30 (×5): 10 mg via ORAL
  Filled 2022-12-24 (×7): qty 1

## 2022-12-24 MED ORDER — SENNA 8.6 MG PO TABS
1.0000 | ORAL_TABLET | Freq: Two times a day (BID) | ORAL | Status: AC
Start: 2022-12-24 — End: ?
  Administered 2022-12-24 – 2022-12-27 (×4): 8.6 mg via ORAL
  Filled 2022-12-24 (×7): qty 1

## 2022-12-24 MED ORDER — OXYCODONE-ACETAMINOPHEN 5-325 MG PO TABS
2.0000 | ORAL_TABLET | Freq: Once | ORAL | Status: DC
Start: 2022-12-24 — End: 2022-12-24

## 2022-12-24 MED ORDER — ACETAMINOPHEN 500 MG PO TABS
1000.0000 mg | ORAL_TABLET | Freq: Three times a day (TID) | ORAL | Status: DC
  Administered 2022-12-24 – 2022-12-30 (×16): 1000 mg via ORAL
  Filled 2022-12-24 (×15): qty 2

## 2022-12-24 MED ORDER — LORATADINE 10 MG PO TABS
10.0000 mg | ORAL_TABLET | Freq: Every day | ORAL | Status: DC
  Administered 2022-12-24 – 2022-12-30 (×7): 10 mg via ORAL
  Filled 2022-12-24 (×7): qty 1

## 2022-12-24 MED ORDER — SODIUM CHLORIDE 0.9 % IV SOLN: INTRAVENOUS | Status: AC

## 2022-12-24 MED ORDER — VITAMIN B-6 100 MG PO TABS
100.0000 mg | ORAL_TABLET | Freq: Every day | ORAL | Status: DC
  Administered 2022-12-24 – 2022-12-30 (×7): 100 mg via ORAL
  Filled 2022-12-24 (×7): qty 1

## 2022-12-24 MED ORDER — ONDANSETRON HCL 4 MG/2ML IJ SOLN
4.0000 mg | Freq: Once | INTRAMUSCULAR | Status: AC
Start: 2022-12-24 — End: 2022-12-24
  Administered 2022-12-24: 4 mg via INTRAVENOUS
  Filled 2022-12-24: qty 2

## 2022-12-24 MED ORDER — ACETAMINOPHEN 650 MG RE SUPP: 650.0000 mg | Freq: Four times a day (QID) | RECTAL | Status: DC | PRN

## 2022-12-24 MED ORDER — VITAMIN D 25 MCG (1000 UNIT) PO TABS
1000.0000 [IU] | ORAL_TABLET | Freq: Every day | ORAL | Status: DC
  Administered 2022-12-24 – 2022-12-30 (×7): 1000 [IU] via ORAL
  Filled 2022-12-24 (×7): qty 1

## 2022-12-24 MED ORDER — ADULT MULTIVITAMIN W/MINERALS CH
1.0000 | ORAL_TABLET | Freq: Every day | ORAL | Status: DC
  Administered 2022-12-24 – 2022-12-30 (×7): 1 via ORAL
  Filled 2022-12-24 (×7): qty 1

## 2022-12-24 MED ORDER — SODIUM CHLORIDE 0.9% FLUSH
3.0000 mL | Freq: Two times a day (BID) | INTRAVENOUS | Status: DC
  Administered 2022-12-24 – 2022-12-30 (×11): 3 mL via INTRAVENOUS

## 2022-12-24 MED ORDER — POLYETHYLENE GLYCOL 3350 17 G PO PACK
17.0000 g | PACK | Freq: Every day | ORAL | Status: DC | PRN
  Administered 2022-12-30: 17 g via ORAL
  Filled 2022-12-24: qty 1

## 2022-12-24 MED ORDER — PREGABALIN 75 MG PO CAPS
75.0000 mg | ORAL_CAPSULE | Freq: Every day | ORAL | Status: DC
  Administered 2022-12-24 – 2022-12-30 (×7): 75 mg via ORAL
  Filled 2022-12-24: qty 3
  Filled 2022-12-24 (×2): qty 1
  Filled 2022-12-24 (×2): qty 3
  Filled 2022-12-24 (×2): qty 1

## 2022-12-24 MED ORDER — ONDANSETRON HCL 4 MG PO TABS: 4.0000 mg | ORAL_TABLET | Freq: Four times a day (QID) | ORAL | Status: DC | PRN

## 2022-12-24 MED ORDER — HYDRALAZINE HCL 20 MG/ML IJ SOLN
5.0000 mg | Freq: Four times a day (QID) | INTRAMUSCULAR | Status: AC | PRN
Start: 2022-12-24 — End: ?

## 2022-12-24 NOTE — Progress Notes (Signed)
Notified of patient. Reviewed CT imaging and will likely need total hip/ hemiarthroplasty for femoral neck fracture. Will plan for formal consult in the morning.We will work on coordinating with a total hip surgeon to see when this can be done. Will plan to keep NPO at midnight for now and once we have confirmation based on surgeon and OR availability will adjust accordingly.

## 2022-12-24 NOTE — H&P (Addendum)
History and Physical    Holly Jimenez UUV:[id number] DOB: Jun 11, 1945 DOA: 12/24/2022  PCP: Irena Reichmann, DO  Patient coming from: Home  I have personally briefly reviewed patient's old medical records in [facility]  Chief Complaint: Left hip pain  HPI: Holly Jimenez is a pleasant 77 y.o. female with medical history significant of Raynaud's disease, CKD stage IIIb, DDD, GERD, osteoarthritis, hypertension, peptic ulcer disease presenting to the ED with sudden onset left hip pain after mechanical fall. Patient states the night prior to admission she tripped and fell in the kitchen hitting the back of her head and falling on the left side with inability to bear weight, excruciating pain and inability to stand.  Patient subsequently crawled to her recliner where she fell asleep woke up this morning due to ongoing left hip pain, inability to bear weight she called her girlfriend who came and helped her.  They called her godson and patient was brought to the urgent care.  Patient seen in urgent care and subsequently brought to [facility], ED.  Patient denies any fevers, no chills, no nausea, no vomiting, no chest pain, no shortness of breath, no abdominal pain, no diarrhea, no constipation, no melena, no hematemesis, no hematochezia.  Patient denies any dysuria.  Patient denies any syncopal episode or loss of consciousness.  Patient denies any lightheadedness or dizziness.  ED Course: Patient seen in the ED head CT done was unremarkable.  Basic metabolic profile with a bicarb of 19, glucose of 103, creatinine of 1.49 otherwise within normal limits.  CBC with a hemoglobin of 11.2 otherwise within normal limits.  Plain films of the left hip done with lucency of the left femoral head may be due to atypical pattern of osteopenia based on the appearance on prior CT from 07/24/2016 however fracture cannot be conclusively excluded.  Further evaluation with noncontrast left hip CT  recommended.  EDP placed formal consultation to orthopedics, Dr. Aundria Rud who will see the patient in consultation.  Hospitalist called for admission.  Review of Systems: As per HPI otherwise all other systems reviewed and are negative.  Past Medical History:  Diagnosis Date   Abnormal LFTs    Allergic rhinitis    Anemia    Anxiety    Arthritis    Chronic back pain    Chronic headaches    Chronic kidney disease, unspecified    DDD (degenerative disc disease)    GERD (gastroesophageal reflux disease)    Headache    Hypercalcemia    Hyperkalemia    Hyperlipidemia    Hypertension    KIDNEY SPECIALIST TOOK OFF BENICAR   IBS (irritable bowel syndrome)    Lupus    Mixed connective tissue disease (HCC)    Nausea    Neuropathy    Peptic ulcer disease    Raynaud's disease    Small kidney    left   Vitamin D deficiency disease     Past Surgical History:  Procedure Laterality Date   ABDOMINAL HYSTERECTOMY  1975   APPENDECTOMY  1972   FOOT SURGERY     LEFT   KYPHOPLASTY Bilateral 03/08/2014   Procedure: Thoracic nine Kyphoplasty;  Surgeon: Coletta Memos, MD;  Location: [facility];  Service: Neurosurgery;  Laterality: Bilateral;  Thoracic nine Kyphoplasty   LIPOMA EXCISION     back   TONSILLECTOMY     WRIST GANGLION EXCISION     left    Social History  reports that she quit smoking about  22 years ago. Her smoking use included cigarettes. She started smoking about 47 years ago. She has a 25 pack-year smoking history. She has never used smokeless tobacco. She reports current alcohol use. She reports that she does not use drugs.  Allergies  Allergen Reactions   Sulfa Antibiotics     Other reaction(s): Other (See Comments)  Other Reaction(s): Other (See Comments)   Sulfonamide Derivatives Hives   Methocarbamol     Other reaction(s): nightmares   Pancrelipase (Lip-Prot-Amyl)     Other reaction(s): indigestion   Sulfacetamide Sodium-Sulfur     Other reaction(s): swelling     Family History  Problem Relation Age of Onset   Ovarian cancer Sister    Colon cancer Sister    Heart disease Paternal Grandfather    Heart disease Maternal Grandfather    Kidney disease Brother    Kidney disease Mother    Hypertension Mother    Kidney disease Maternal Grandmother    Diabetes Paternal Grandmother    Asthma Father    Suicidality Father    Alcoholism Father     Prior to Admission medications   Medication Sig Start Date End Date Taking? Authorizing Provider  amLODipine (NORVASC) 5 MG tablet Take 5 mg by mouth 2 (two) times daily. 06/21/20  Yes [provider]  Baclofen 5 MG TABS Take 1 tablet (5 mg total) by mouth daily as needed. 05/20/22  Yes Raulkar, Drema Pry, MD  cetirizine (ZYRTEC) 5 MG tablet Take 5 mg by mouth daily as needed for allergies.    Yes [provider]  cholecalciferol (VITAMIN D) 1000 UNITS tablet Take 1,000 Units by mouth daily.   Yes [provider]  clotrimazole-betamethasone (LOTRISONE) cream Apply topically. 02/23/18  Yes [provider]  diclofenac Sodium (VOLTAREN) 1 % GEL Apply topically. 02/23/18  Yes [provider]  estradiol (ESTRACE) 0.1 MG/GM vaginal cream  12/15/17  Yes [provider]  ferrous gluconate (FERGON) 240 (27 FE) MG tablet Take 240 mg by mouth 2 (two) times daily.   Yes [provider]  HYDROcodone-acetaminophen (NORCO/VICODIN) 5-325 MG tablet Take 1 tablet by mouth 2 (two) times daily as needed for moderate pain (pain score 4-6). 12/12/22  Yes Jones Bales, NP  hydroxychloroquine (PLAQUENIL) 200 MG tablet Take 200 mg by mouth 2 (two) times daily.    Yes [provider]  ketoconazole (NIZORAL) 2 % cream Apply 1 application topically daily as needed (infection treatment).    Yes [provider]  Lactobacillus Rhamnosus, GG, (RA PROBIOTIC DIGESTIVE CARE) CAPS Take 1 capsule by mouth daily.   Yes [provider]  leflunomide (ARAVA) 10  MG tablet Take 10 mg by mouth daily. 02/23/18  Yes [provider]  Lidocaine 4 % PTCH Apply 1 patch topically daily as needed. 12 hours on, 12 hours off 09/22/19  Yes Raulkar, Drema Pry, MD  losartan (COZAAR) 50 MG tablet Take 50 mg by mouth in the morning and at bedtime. 11/02/19  Yes [provider]  Multiple Vitamin (MULTIVITAMIN WITH MINERALS) TABS tablet Take 1 tablet by mouth daily.   Yes [provider]  omeprazole (PRILOSEC) 20 MG capsule Take 20 mg by mouth 2 (two) times daily.  09/17/11  Yes [provider]  oxybutynin (DITROPAN) 5 MG tablet Take 5 mg by mouth 2 (two) times daily. 10/17/19  Yes [provider]  PHENADOZ 25 MG suppository Place 25 mg rectally every 6 (six) hours as needed for nausea or vomiting.  01/12/14  Yes [provider]  predniSONE (DELTASONE) 5 MG tablet Take 5 mg by mouth daily. continuous   Yes [provider]  pregabalin (LYRICA) 75 MG capsule Take 75 mg by mouth daily. 04/28/22  Yes [provider]  pyridOXINE (VITAMIN B-6) 100 MG tablet Take 100 mg by mouth daily.   Yes [provider]  Tetrahydrozoline HCl (VISINE OP) Place 2 drops into both eyes as needed (for dry eyes).   Yes [provider]  vitamin B-12 (CYANOCOBALAMIN) 100 MCG tablet    Yes [provider]  vitamin B-12 (CYANOCOBALAMIN) 1000 MCG tablet Take 1,000 mcg by mouth daily.   Yes [provider]  Leda Min 5-2.5-18.5 LF-MCG/0.5 injection  08/07/21   [provider]  PFIZER-BIONTECH COVID-19 VACC 30 MCG/0.3ML injection  02/03/20   [provider]  [facility] injection  07/04/20   [provider]    Physical Exam: Vitals:   12/24/22 1156 12/24/22 1202 12/24/22 1345 12/24/22 1415  BP: (!) 143/57     Pulse: 71     Resp: (!) 24     Temp: 98.2 F (36.8 C)     TempSrc: Oral     SpO2: 90%  (!) 88% 90%  Weight:  37.2 kg    Height:  5\' 1"  (1.549 m)      Constitutional:  NAD, calm, comfortable Vitals:   12/24/22 1156 12/24/22 1202 12/24/22 1345 12/24/22 1415  BP: (!) 143/57     Pulse: 71     Resp: (!) 24     Temp: 98.2 F (36.8 C)     TempSrc: Oral     SpO2: 90%  (!) 88% 90%  Weight:  37.2 kg    Height:  5\' 1"  (1.549 m)     Eyes: PERRL, lids and conjunctivae normal ENMT: Mucous membranes are dry. Posterior pharynx clear of any exudate or lesions.Normal dentition.  Neck: normal, supple, no masses, no thyromegaly Respiratory: clear to auscultation bilaterally, no wheezing, no crackles. Normal respiratory effort. No accessory muscle use.  Cardiovascular: Regular rate and rhythm, no murmurs / rubs / gallops. No extremity edema. 2+ pedal pulses. No carotid bruits.  Abdomen: no tenderness, no masses palpated. No hepatosplenomegaly. Bowel sounds positive.  Musculoskeletal: no clubbing / cyanosis.  Left lower extremity slightly externally rotated and slightly shortened.  Tender to palpation left femur.  No lower extremity edema.  1/5 left lower extremity strength.  Skin: no rashes, lesions, ulcers. No induration Neurologic: CN 2-12 grossly intact. Sensation intact,. Strength 5/5 bilateral upper extremity strength.  4/5 right lower extremity strength.  1/5 left lower extremity strength.   Psychiatric: Normal judgment and insight. Alert and oriented x 3. Normal mood.   Labs on Admission: I have personally reviewed following labs and imaging studies  CBC: Recent Labs  Lab 12/24/22 1400  WBC 10.2  NEUTROABS 8.5*  HGB 11.2*  HCT 35.6*  MCV 88.1  PLT 211    Basic Metabolic Panel: Recent Labs  Lab 12/24/22 1400  NA 135  K 4.1  CL 105  CO2 19*  GLUCOSE 103*  BUN 22  CREATININE 1.49*  CALCIUM 9.8    GFR: Estimated Creatinine Clearance: 18 mL/min (A) (by C-G formula based on SCr of 1.49 mg/dL (H)).  Liver Function Tests: No results for input(s): "AST", "ALT", "ALKPHOS", "BILITOT", "PROT", "ALBUMIN" in the last 168 hours.  Urine analysis: No  results found for: "COLORURINE", "APPEARANCEUR", "LABSPEC", "PHURINE", "GLUCOSEU", "HGBUR", "BILIRUBINUR", "KETONESUR", "PROTEINUR", "UROBILINOGEN", "NITRITE", "LEUKOCYTESUR"  Radiological Exams on  Admission: DG Hip Unilat W or Wo Pelvis 2-3 Views Left Result Date: 12/24/2022 CLINICAL DATA:  Left hip pain status post fall EXAM: DG HIP (WITH OR WITHOUT PELVIS) 2-3V LEFT COMPARISON:  CT abdomen pelvis with contrast FINDINGS: Severe degenerative changes of the lower lumbar spine and right hip. Atypical femoral head lucency may be due to fracture or atypical osteopenia, given appearance on prior CT. However, fracture can not be completely excluded. IMPRESSION: Lucency of the left femoral head may be due to atypical pattern of osteopenia based on appearance on prior CT from 07/14/2016. However, fracture can not be conclusively excluded. Further evaluation with noncontrast left hip CT is recommended. Electronically Signed   By: Acquanetta Belling M.D.   On: 12/24/2022 13:59   CT Head Wo Contrast Result Date: 12/24/2022 CLINICAL DATA:  Ataxia, acute, traumatic.  Fall. EXAM: CT HEAD WITHOUT CONTRAST TECHNIQUE: Contiguous axial images were obtained from the base of the skull through the vertex without intravenous contrast. RADIATION DOSE REDUCTION: This exam was performed according to the departmental dose-optimization program which includes automated exposure control, adjustment of the mA and/or kV according to patient size and/or use of iterative reconstruction technique. COMPARISON:  Head CT 10/19/2017 FINDINGS: Brain: There is no evidence of an acute infarct, intracranial hemorrhage, mass, midline shift, or extra-axial fluid collection. Patchy to confluent hypodensities in the cerebral white matter have mildly progressed and are nonspecific but compatible with moderate to severe chronic small vessel ischemic disease. Mild cerebral atrophy is within normal limits for age. Vascular: Calcified atherosclerosis at the  skull base. No hyperdense vessel. Skull: No acute fracture or suspicious osseous lesion. Sinuses/Orbits: Focal mucosal thickening or small retention cyst inferomedially in the left frontal sinus. Clear mastoid air cells. Bilateral cataract extraction. Other: None. IMPRESSION: 1. No evidence of acute intracranial abnormality. 2. Moderate to severe chronic small vessel ischemic disease. Electronically Signed   By: Sebastian Ache M.D.   On: 12/24/2022 13:22    EKG: Not done.  Assessment/Plan Principal Problem:   Acute pain of left hip Active Problems:   Ulcer of the stomach and intestine   Raynaud disease   Essential hypertension   Gait abnormality   CKD stage 3b, GFR 30-44 ml/min (HCC)   #1 acute left hip pain -Secondary to mechanical fall. -Patient denies any loss of consciousness states she is tripped with inability to bear weight and ambulate with significant left hip pain. -Concern for possible fracture. -Plain films of the left hip and pelvis with lucency of the left femoral head may be due to atypical pattern of osteopenia however fracture cannot be conclusively excluded. -Noncontrast left hip CT ordered and pending. -Left hip/femur tender to palpation, left lower extremity slightly externally rotated and shortened. -Placed on scheduled Tylenol 1000 mg 3 times daily. -Oxycodone 5 mg every 4 hours as needed breakthrough pain. -Resume home regimen Lyrica. -IV Dilaudid 0.5 mg every 4 hours as needed severe pain. -PT/OT. -Orthopedic PA stated orthopedics has been formally consulted and patient will be seen in consultation by Dr. Aundria Rud. -Supportive care.  2.  Peptic ulcer disease/GERD -PPI.  3.  Hypertension -Resume home regimen Norvasc, losartan.  4.  CKD stage IIIb -Stable.  5.  Raynaud's disease -Resume home regimen Plaquenil, Arava.    DVT prophylaxis: Heparin Code Status:   Full Family Communication:  Updated patient.  No family at bedside. Disposition Plan:    Patient is from:  Home  Anticipated DC to:  SNF versus home with home health  Anticipated  DC date:  TBD  Anticipated DC barriers: Clinical improvement Consults called:  Orthopedics: Dr. Aundria Rud per ED PA Admission status:  Place in observation/MedSurg  Severity of Illness: The appropriate patient status for this patient is OBSERVATION. Observation status is judged to be reasonable and necessary in order to provide the required intensity of service to ensure the patient's safety. The patient's presenting symptoms, physical exam findings, and initial radiographic and laboratory data in the context of their medical condition is felt to place them at decreased risk for further clinical deterioration. Furthermore, it is anticipated that the patient will be medically stable for discharge from the hospital within 2 midnights of admission.     Ramiro Harvest MD [facility]  How to contact the [facility] Attending or Consulting provider 7A - 7P or covering provider during after hours 7P -7A, for this patient?   Check the care team in [facility] and look for a) attending/consulting [facility] provider listed and b) the [facility] team listed Log into [email or web address] and use [place]'s universal password to access. If you do not have the password, please contact the hospital operator. Locate the [facility] provider you are looking for under [facility] and page to a number that you can be directly reached. If you still have difficulty reaching the provider, please page the [facility] (Director on Call) for the Hospitalists listed on amion for assistance.  12/24/2022, 4:15 PM

## 2022-12-24 NOTE — Plan of Care (Signed)

## 2022-12-24 NOTE — ED Provider Notes (Signed)
[place] EMERGENCY DEPARTMENT AT [facility] Provider Note   CSN: [id number] Arrival date & time: 12/24/22  1102     History  Chief Complaint  Patient presents with   Holly Jimenez is a 77 y.o. female history of CKD and stomach ulcer, hypertension, presents with concern for left hip pain.  Reports she was in her kitchen yesterday when she tripped and fell.  She reports hitting the back of her head, but did not lose consciousness.  She then crawled to a chair where she fell asleep, and then called for help this morning.  She is unable to ambulate due to the pain.   Patient not on any anticoagulation   Fall       Home Medications Prior to Admission medications   Medication Sig Start Date End Date Taking? Authorizing Provider  amLODipine (NORVASC) 5 MG tablet Take 5 mg by mouth 2 (two) times daily. 06/21/20  Yes [provider]  Baclofen 5 MG TABS Take 1 tablet (5 mg total) by mouth daily as needed. 05/20/22  Yes Raulkar, Drema Pry, MD  cetirizine (ZYRTEC) 5 MG tablet Take 5 mg by mouth daily as needed for allergies.    Yes [provider]  cholecalciferol (VITAMIN D) 1000 UNITS tablet Take 1,000 Units by mouth daily.   Yes [provider]  clotrimazole-betamethasone (LOTRISONE) cream Apply topically. 02/23/18  Yes [provider]  diclofenac Sodium (VOLTAREN) 1 % GEL Apply topically. 02/23/18  Yes [provider]  estradiol (ESTRACE) 0.1 MG/GM vaginal cream  12/15/17  Yes [provider]  ferrous gluconate (FERGON) 240 (27 FE) MG tablet Take 240 mg by mouth 2 (two) times daily.   Yes [provider]  HYDROcodone-acetaminophen (NORCO/VICODIN) 5-325 MG tablet Take 1 tablet by mouth 2 (two) times daily as needed for moderate pain (pain score 4-6). 12/12/22  Yes Jones Bales, NP  hydroxychloroquine (PLAQUENIL) 200 MG tablet Take 200 mg by mouth 2 (two) times daily.    Yes [provider]  ketoconazole (NIZORAL) 2 % cream Apply 1 application topically daily as needed (infection treatment).    Yes [provider]  Lactobacillus Rhamnosus, GG, (RA PROBIOTIC DIGESTIVE CARE) CAPS Take 1 capsule by mouth daily.   Yes [provider]  leflunomide (ARAVA) 10 MG tablet Take 10 mg by mouth daily. 02/23/18  Yes [provider]  Lidocaine 4 % PTCH Apply 1 patch topically daily as needed. 12 hours on, 12 hours off 09/22/19  Yes Raulkar, Drema Pry, MD  losartan (COZAAR) 50 MG tablet Take 50 mg by mouth in the morning and at bedtime. 11/02/19  Yes [provider]  Multiple Vitamin (MULTIVITAMIN WITH MINERALS) TABS tablet Take 1 tablet by mouth daily.   Yes [provider]  omeprazole (PRILOSEC) 20 MG capsule Take 20 mg by mouth 2 (two) times daily.  09/17/11  Yes [provider]  oxybutynin (DITROPAN) 5 MG tablet Take 5 mg by mouth 2 (two) times daily. 10/17/19  Yes [provider]  PHENADOZ 25 MG suppository Place 25 mg rectally every 6 (six) hours as needed for nausea or vomiting.  01/12/14  Yes [provider]  predniSONE (DELTASONE) 5 MG tablet Take 5 mg by mouth daily. continuous   Yes [provider]  pregabalin (LYRICA) 75 MG capsule Take 75 mg by mouth daily. 04/28/22  Yes [provider]  pyridOXINE (VITAMIN B-6) 100 MG tablet Take 100 mg by mouth  daily.   Yes [provider]  Tetrahydrozoline HCl (VISINE OP) Place 2 drops into both eyes as needed (for dry eyes).   Yes [provider]  vitamin B-12 (CYANOCOBALAMIN) 100 MCG tablet    Yes [provider]  vitamin B-12 (CYANOCOBALAMIN) 1000 MCG tablet Take 1,000 mcg by mouth daily.   Yes [provider]  Leda Min 5-2.5-18.5 LF-MCG/0.5 injection  08/07/21   [provider]  PFIZER-BIONTECH COVID-19 VACC 30 MCG/0.3ML injection  02/03/20   [provider]  [facility] injection  07/04/20   [provider]      Allergies    Sulfa antibiotics, Sulfonamide derivatives, Methocarbamol, Pancrelipase (lip-prot-amyl), and Sulfacetamide sodium-sulfur    Review of Systems   Review of Systems  Musculoskeletal:        Left hip pain    Physical Exam Updated Vital Signs BP (!) 143/57 (BP Location: Right Arm)   Pulse 71   Temp 98.2 F (36.8 C) (Oral)   Resp (!) 24   Ht 5\' 1"  (1.549 m)   Wt 37.2 kg   SpO2 90% Comment: placed on 3l [place]  BMI 15.49 kg/m  Physical Exam Vitals and nursing note reviewed.  Constitutional:      Appearance: Normal appearance.  HENT:     Head: Atraumatic.  Cardiovascular:     Comments: Feet bilaterally with 2+ dorsalis pedis pulses and good cap refill. Pulmonary:     Effort: Pulmonary effort is normal.  Musculoskeletal:     Comments: Arrives in wheelchair and unable to ambulate due to pain.   Patient with no obvious left or right lower extremity deformity, erythema, or edema.  Legs appear symmetric in length.  Patient with no tenderness to palpation of the left or right greater trochanter, hip, or femur diffusely.  Patient has pain with slight movement of the left hip.    Neurological:     General: No focal deficit present.     Mental Status: She is alert.     Comments: Patient with 5/5 strength with plantarflexion dorsiflexion bilaterally, intact sensation of the feet bilaterally.   Psychiatric:        Mood and Affect: Mood normal.        Behavior: Behavior normal.     ED Results / Procedures / Treatments   Labs (all labs ordered are listed, but only abnormal results are displayed) Labs Reviewed  CBC WITH DIFFERENTIAL/PLATELET - Abnormal; Notable for the following components:      Result Value   Hemoglobin 11.2 (*)    HCT 35.6 (*)    Neutro Abs 8.5 (*)    All other components within normal limits  BASIC METABOLIC PANEL - Abnormal; Notable for the following components:   CO2 19 (*)    Glucose, Bld 103 (*)    Creatinine, Ser 1.49  (*)    GFR, Estimated 36 (*)    All other components within normal limits    EKG None  Radiology DG Hip Unilat W or Wo Pelvis 2-3 Views Left Result Date: 12/24/2022 CLINICAL DATA:  Left hip pain status post fall EXAM: DG HIP (WITH OR WITHOUT PELVIS) 2-3V LEFT COMPARISON:  CT abdomen pelvis with contrast FINDINGS: Severe degenerative changes of the lower lumbar spine and right hip. Atypical femoral head lucency may be due to fracture or atypical osteopenia, given appearance on prior CT. However, fracture can not be completely excluded. IMPRESSION: Lucency of the left femoral head may be due to atypical pattern of osteopenia based  on appearance on prior CT from 07/14/2016. However, fracture can not be conclusively excluded. Further evaluation with noncontrast left hip CT is recommended. Electronically Signed   By: Acquanetta Belling M.D.   On: 12/24/2022 13:59   CT Head Wo Contrast Result Date: 12/24/2022 CLINICAL DATA:  Ataxia, acute, traumatic.  Fall. EXAM: CT HEAD WITHOUT CONTRAST TECHNIQUE: Contiguous axial images were obtained from the base of the skull through the vertex without intravenous contrast. RADIATION DOSE REDUCTION: This exam was performed according to the departmental dose-optimization program which includes automated exposure control, adjustment of the mA and/or kV according to patient size and/or use of iterative reconstruction technique. COMPARISON:  Head CT 10/19/2017 FINDINGS: Brain: There is no evidence of an acute infarct, intracranial hemorrhage, mass, midline shift, or extra-axial fluid collection. Patchy to confluent hypodensities in the cerebral white matter have mildly progressed and are nonspecific but compatible with moderate to severe chronic small vessel ischemic disease. Mild cerebral atrophy is within normal limits for age. Vascular: Calcified atherosclerosis at the skull base. No hyperdense vessel. Skull: No acute fracture or suspicious osseous lesion. Sinuses/Orbits: Focal  mucosal thickening or small retention cyst inferomedially in the left frontal sinus. Clear mastoid air cells. Bilateral cataract extraction. Other: None. IMPRESSION: 1. No evidence of acute intracranial abnormality. 2. Moderate to severe chronic small vessel ischemic disease. Electronically Signed   By: Sebastian Ache M.D.   On: 12/24/2022 13:22    Procedures Procedures    Medications Ordered in ED Medications  morphine (PF) 4 MG/ML injection 4 mg (4 mg Intravenous Given 12/24/22 1404)  ondansetron (ZOFRAN) injection 4 mg (4 mg Intravenous Given 12/24/22 1359)  acetaminophen (TYLENOL) tablet 1,000 mg (1,000 mg Oral Given 12/24/22 1405)    ED Course/ Medical Decision Making/ A&P Clinical Course as of 12/24/22 1519  Wed Dec 24, 2022  1434 77 yo female here with fall and suspected hip fracture on xray, femur head - pending CT imaging and admission [MT]  1446 Pain improved with morphine.  PA provider to consult ortho, admit to hospitalist.  Pt's son updated by phone. [MT]    Clinical Course User Index [MT] Trifan, Kermit Balo, MD                                 Medical Decision Making Amount and/or Complexity of Data Reviewed Labs: ordered. Radiology: ordered.  Risk OTC drugs. Prescription drug management. Decision regarding hospitalization.     Differential diagnosis includes but is not limited to soft tissue injury, fracture, dislocation, rhabdomyolysis  ED Course:  Patient appears uncomfortable and in pain with movement.  Required assistance with transferring from wheelchair to the bed.  Unable to ambulate on her own.  No obvious deformity, erythema, edema of the lower extremities.  No tenderness to palpation of the hip or femur diffusely, but has pain with movement.  Neurovascular intact in the lower extremities bilaterally.  Due to report of hitting her head when she fell, CT head was obtained which showed no signs of acute intracranial abnormality.  She is able to to move neck  left and right 45 degrees without difficulty, no spinous process tenderness palpation, no indication for cervical spine imaging at this time.  X-ray of the hips bilaterally shows lucencies the left femoral head.  Unknown at this time if this is due to osteopenia versus fracture.  Will proceed with noncontrast left hip CT for further evaluation.  Will plan on  admission for pain control.  Patient maintained on cardiac monitoring which shows normal sinus rhythm.  CBC and BMP at patient baseline Patient given IV morphine, Tylenol for pain.   Impression: Left hip pain after mechanical fall, likely fracture  Disposition:  Admission with Dr. Ramiro Harvest for pain control.   Lab Tests: I Ordered, and personally interpreted labs.  The pertinent results include:   CBC, BMP at patient baseline   Consultations Obtained: I requested consultation with orthopedics Dr. Aundria Rud,  and discussed lab and imaging findings as well as pertinent plan - they recommend: will follow patient if fracture seen on CT hip I requested consultation with hospitalist Dr. Janee Morn,  and discussed lab and imaging findings as well as pertinent plan - they recommend: admission for pain control  External records from outside source obtained and reviewed including urgent care note from earlier today.              Final Clinical Impression(s) / ED Diagnoses Final diagnoses:  Fall, initial encounter  Left hip pain    Rx / DC Orders ED Discharge Orders     None         Arabella Merles, PA-C 12/24/22 1520    Terald Sleeper, MD 12/24/22 [phone]

## 2022-12-24 NOTE — ED Triage Notes (Addendum)
Pt presents after a fall last night. States she hit her head and could not get up initially but she crawled to her den to a chair and was able to get in chair. She slept there and when she woke up she was unable to stand due to left leg pain.  Pain is mainly in groin area. She took hydrocodone around 6 am

## 2022-12-24 NOTE — ED Triage Notes (Addendum)
Pt came to ED for a fall last night. Pt states she hit head, denies blood thinners. C/O left hip pain. No obvious external deformity noted.

## 2022-12-24 NOTE — ED Provider Notes (Signed)
[facility]    CSN: [id number] Arrival date & time: 12/24/22  2130      History   Chief Complaint Chief Complaint  Patient presents with   Fall    HPI Holly Jimenez is a 77 y.o. female.   Patient presents to clinic complaining of left hip, leg and left lower back pain after a fall yesterday.  She is unable to bear weight and unable to walk.  She is in a wheelchair.  Patient is unsure what happened to cause her fall yesterday, thinks maybe her foot got tripped up while she was cleaning something.  She fell onto her left side and hit the back of her head.  Denies any loss of consciousness.  She is not on blood thinners.  She was unable to get up after her fall and had to crawl into her living room and into her recliner, where she fell asleep.  In the morning she was in more severe pain and she called her girlfriend over to help get her dressed.  A female friend drove her to the urgent care.  She did take a hydrocodone around 6 AM that has helped some with the pain.  She is fidgety and unable to sit still in clinic.  The history is provided by the patient and medical records.  Fall    Past Medical History:  Diagnosis Date   Abnormal LFTs    Allergic rhinitis    Anemia    Anxiety    Arthritis    Chronic back pain    Chronic headaches    Chronic kidney disease, unspecified    DDD (degenerative disc disease)    GERD (gastroesophageal reflux disease)    Headache    Hypercalcemia    Hyperkalemia    Hyperlipidemia    Hypertension    KIDNEY SPECIALIST TOOK OFF BENICAR   IBS (irritable bowel syndrome)    Lupus    Mixed connective tissue disease (HCC)    Nausea    Neuropathy    Peptic ulcer disease    Raynaud's disease    Small kidney    left   Vitamin D deficiency disease     Patient Active Problem List   Diagnosis Date Noted   Abnormal TSH 05/31/2019   Gait abnormality 05/30/2019   Paresthesia 05/30/2019   Low back pain without sciatica  05/30/2019   Subdural hematoma (HCC) 11/05/2016   Compression fracture of body of thoracic vertebra (HCC) 03/08/2014   Neuropathy 02/16/2014   Ulcer of the stomach and intestine 02/16/2014   Raynaud disease 02/16/2014   Abnormal ECG 02/16/2014   Essential hypertension 02/16/2014   Abnormal EKG 02/16/2014   NONSPECIFIC ABN FINDING RAD & OTH EXAM GI TRACT 08/22/2008    Past Surgical History:  Procedure Laterality Date   ABDOMINAL HYSTERECTOMY  1975   APPENDECTOMY  1972   FOOT SURGERY     LEFT   KYPHOPLASTY Bilateral 03/08/2014   Procedure: Thoracic nine Kyphoplasty;  Surgeon: Coletta Memos, MD;  Location: [facility];  Service: Neurosurgery;  Laterality: Bilateral;  Thoracic nine Kyphoplasty   LIPOMA EXCISION     back   TONSILLECTOMY     WRIST GANGLION EXCISION     left    OB History   No obstetric history on file.      Home Medications    Prior to Admission medications   Medication Sig Start Date End Date Taking? Authorizing Provider  ALPRAZolam (NIRAVAM) 0.25 MG dissolvable tablet Take  by mouth. 02/23/18   [provider]  amLODipine (NORVASC) 2.5 MG tablet Take by mouth. 10/29/16   [provider]  amLODipine (NORVASC) 5 MG tablet Take 5 mg by mouth 2 (two) times daily. 06/21/20   [provider]  aspirin EC 81 MG tablet Take by mouth. Patient not taking: Reported on 12/24/2022 02/23/18   [provider]  Baclofen 5 MG TABS Take 1 tablet (5 mg total) by mouth daily as needed. 05/20/22   Horton Chin, MD  BOOSTRIX 5-2.5-18.5 LF-MCG/0.5 injection  08/07/21   [provider]  cetirizine (ZYRTEC) 5 MG tablet Take 5 mg by mouth daily as needed for allergies.     [provider]  cholecalciferol (VITAMIN D) 1000 UNITS tablet Take 1,000 Units by mouth daily.    [provider]  clotrimazole-betamethasone (LOTRISONE) cream Apply topically. 02/23/18   [provider]  diclofenac Sodium (VOLTAREN) 1 % GEL Apply  topically. 02/23/18   [provider]  estradiol (ESTRACE) 0.1 MG/GM vaginal cream  12/15/17   [provider]  ferrous gluconate (FERGON) 240 (27 FE) MG tablet Take 240 mg by mouth 2 (two) times daily.    [provider]  HYDROcodone-acetaminophen (NORCO/VICODIN) 5-325 MG tablet Take 1 tablet by mouth 2 (two) times daily as needed for moderate pain (pain score 4-6). 12/12/22   Jones Bales, NP  hydroxychloroquine (PLAQUENIL) 200 MG tablet Take 200 mg by mouth 2 (two) times daily.     [provider]  ketoconazole (NIZORAL) 2 % cream Apply 1 application topically daily as needed (infection treatment).     [provider]  Lactobacillus Rhamnosus, GG, (RA PROBIOTIC DIGESTIVE CARE) CAPS Take 1 capsule by mouth daily.    [provider]  leflunomide (ARAVA) 10 MG tablet Take by mouth. 02/23/18   [provider]  lidocaine (LIDODERM) 5 % 1 patch. 02/23/18   [provider]  Lidocaine 4 % PTCH Apply 1 patch topically daily as needed. 12 hours on, 12 hours off 09/22/19   Raulkar, Drema Pry, MD  losartan (COZAAR) 50 MG tablet Take 50 mg by mouth daily. 11/02/19   [provider]  mirtazapine (REMERON) 15 MG tablet  11/22/17   [provider]  Multiple Vitamin (MULTIVITAMIN WITH MINERALS) TABS tablet Take 1 tablet by mouth daily.    [provider]  omeprazole (PRILOSEC) 20 MG capsule Take 20 mg by mouth 2 (two) times daily.  09/17/11   [provider]  ondansetron (ZOFRAN) 8 MG tablet Take 8 mg by mouth 3 (three) times daily. Patient not taking: Reported on 12/24/2022 06/30/19   [provider]  oxybutynin (DITROPAN) 5 MG tablet Take 5 mg by mouth 2 (two) times daily. 10/17/19   [provider]  PFIZER-BIONTECH COVID-19 VACC 30 MCG/0.3ML injection  02/03/20   [provider]  PHENADOZ 25 MG suppository Place 25 mg rectally every 6 (six) hours as needed for nausea or vomiting.   01/12/14   [provider]  predniSONE (DELTASONE) 5 MG tablet Take 5 mg by mouth every other day. continuous    [provider]  pregabalin (LYRICA) 75 MG capsule 1 capsule Orally twice per day for 90 days 04/28/22   [provider]  pyridOXINE (VITAMIN B-6) 100 MG tablet Take 100 mg by mouth daily.    [provider]  [facility] injection  07/04/20   [provider]  Tetrahydrozoline HCl (VISINE OP) Place 2 drops into both eyes  as needed (for dry eyes).    [provider]  topiramate (TOPAMAX) 100 MG tablet Take 1 tablet (100 mg total) by mouth 2 (two) times daily. Please call to schedule follow up or may request refills from PCP. 11/23/17   Levert Feinstein, MD  vitamin B-12 (CYANOCOBALAMIN) 100 MCG tablet     [provider]  vitamin B-12 (CYANOCOBALAMIN) 1000 MCG tablet Take 1,000 mcg by mouth daily.    [provider]    Family History Family History  Problem Relation Age of Onset   Ovarian cancer Sister    Colon cancer Sister    Heart disease Paternal Grandfather    Heart disease Maternal Grandfather    Kidney disease Brother    Kidney disease Mother    Hypertension Mother    Kidney disease Maternal Grandmother    Diabetes Paternal Grandmother    Asthma Father    Suicidality Father    Alcoholism Father     Social History Social History   Tobacco Use   Smoking status: Former    Current packs/day: 0.00    Average packs/day: 1 pack/day for 25.0 years (25.0 ttl pk-yrs)    Types: Cigarettes    Start date: 03/06/1975    Quit date: 03/05/2000    Years since quitting: 22.8   Smokeless tobacco: Never  Vaping Use   Vaping status: Never Used  Substance Use Topics   Alcohol use: Yes    Comment: Rare   Drug use: No    Frequency: 2.0 times per week    Types: Marijuana    Comment: previous use of marijuana for pain     Allergies   Sulfa antibiotics, Sulfonamide derivatives, Methocarbamol, Pancrelipase  (lip-prot-amyl), and Sulfacetamide sodium-sulfur   Review of Systems Review of Systems  Per HPI   Physical Exam Triage Vital Signs ED Triage Vitals  Encounter Vitals Group     BP 12/24/22 0930 126/60     Systolic BP Percentile --      Diastolic BP Percentile --      Pulse Rate 12/24/22 0930 68     Resp 12/24/22 0930 16     Temp 12/24/22 0930 98.6 F (37 C)     Temp Source 12/24/22 0930 Oral     SpO2 12/24/22 0930 91 %     Weight --      Height --      Head Circumference --      Peak Flow --      Pain Score 12/24/22 0927 10     Pain Loc --      Pain Education --      Exclude from Growth Chart --    No data found.  Updated Vital Signs BP 126/60 (BP Location: Right Arm)   Pulse 68   Temp 98.6 F (37 C) (Oral)   Resp 16   SpO2 91% Comment: Pt has raynauds.  Visual Acuity Right Eye Distance:   Left Eye Distance:   Bilateral Distance:    Right Eye Near:   Left Eye Near:    Bilateral Near:     Physical Exam Vitals and nursing note reviewed.  Constitutional:      Appearance: Normal appearance.  HENT:     Head: Normocephalic and atraumatic.     Right Ear: External ear normal.     Left Ear: External ear normal.     Nose: Nose normal.     Mouth/Throat:     Mouth: Mucous membranes are moist.  Eyes:     Conjunctiva/sclera: Conjunctivae normal.  Cardiovascular:     Rate and Rhythm: Normal rate.  Pulmonary:     Effort: Pulmonary effort is normal. No respiratory distress.  Musculoskeletal:        General: Normal range of motion.  Skin:    General: Skin is warm and dry.  Neurological:     General: No focal deficit present.     Mental Status: She is alert.  Psychiatric:        Mood and Affect: Mood normal.      UC Treatments / Results  Labs (all labs ordered are listed, but only abnormal results are displayed) Labs Reviewed - No data to display  EKG   Radiology No results found.  Procedures Procedures (including critical care  time)  Medications Ordered in UC Medications - No data to display  Initial Impression / Assessment and Plan / UC Course  I have reviewed the triage vital signs and the nursing notes.  Pertinent labs & imaging results that were available during my care of the patient were reviewed by me and considered in my medical decision making (see chart for details).  Vitals and triage reviewed, patient appears hemodynamically stable.  She is in a wheelchair, restless.  Discussed due to age, fall and hitting head that she is a candidate for a head CT.  Concern for potential hip fracture due to inability to bear weight.  She does have a history of chronic back pain, degenerative disc disease and is on chronic pain management, concern for pain management as well.  Shared decision making with patient, patient agreeable to head to [facility] emergency department for advanced imaging and further evaluation.  Friend who brought her to the clinic will take her down to [facility] via POV.  No questions at this time.     Final Clinical Impressions(s) / UC Diagnoses   Final diagnoses:  Fall, initial encounter  Left hip pain     Discharge Instructions      Head immediately to [facility] emergency department for further evaluation.     ED Prescriptions   None    PDMP not reviewed this encounter.   Lindi Abram, [place] N, [place] 12/24/22 1001

## 2022-12-24 NOTE — ED Notes (Signed)
Patient is being discharged from the Urgent Care and sent to the Emergency Department via POV . Per [place] Garrison, NP, patient is in need of higher level of care due to fall and hit to head, possible LOC. Patient is aware and verbalizes understanding of plan of care.  Vitals:   12/24/22 0930  BP: 126/60  Pulse: 68  Resp: 16  Temp: 98.6 F (37 C)  SpO2: 91%

## 2022-12-24 NOTE — Discharge Instructions (Signed)
Head immediately to [facility] emergency department for further evaluation.

## 2022-12-24 NOTE — ED Notes (Signed)
ED TO INPATIENT HANDOFF REPORT  ED Nurse Name and Phone #: Tyson Dense Name/Age/Gender Holly Jimenez 77 y.o. female Room/Bed: 028C/028C  Code Status   Code Status: Prior  Home/SNF/Other Home Patient oriented to: self, place, time, and situation Is this baseline? Yes   Triage Complete: Triage complete  Chief Complaint Acute pain of left hip [M25.552]  Triage Note Pt came to ED for a fall last night. Pt states she hit head, denies blood thinners. C/O left hip pain. No obvious external deformity noted.    Allergies Allergies  Allergen Reactions   Sulfa Antibiotics     Other reaction(s): Other (See Comments)  Other Reaction(s): Other (See Comments)   Sulfonamide Derivatives Hives   Methocarbamol     Other reaction(s): nightmares   Pancrelipase (Lip-Prot-Amyl)     Other reaction(s): indigestion   Sulfacetamide Sodium-Sulfur     Other reaction(s): swelling    Level of Care/Admitting Diagnosis ED Disposition     ED Disposition  Admit   Condition  --   Comment  Hospital Area: [facility] [[id number]]  Level of Care: Med-Surg [16]  May place patient in observation at [facility] or [facility] if equivalent level of care is available:: No  Covid Evaluation: Asymptomatic - no recent exposure (last 10 days) testing not required  Diagnosis: Acute pain of left hip [[id number]]  Admitting Physician: Rodolph Bong [[id number]]  Attending Physician: Rodolph Bong [[id number]]          B Medical/Surgery History Past Medical History:  Diagnosis Date   Abnormal LFTs    Allergic rhinitis    Anemia    Anxiety    Arthritis    Chronic back pain    Chronic headaches    Chronic kidney disease, unspecified    DDD (degenerative disc disease)    GERD (gastroesophageal reflux disease)    Headache    Hypercalcemia    Hyperkalemia    Hyperlipidemia    Hypertension    KIDNEY SPECIALIST TOOK OFF BENICAR   IBS (irritable bowel syndrome)    Lupus     Mixed connective tissue disease (HCC)    Nausea    Neuropathy    Peptic ulcer disease    Raynaud's disease    Small kidney    left   Vitamin D deficiency disease    Past Surgical History:  Procedure Laterality Date   ABDOMINAL HYSTERECTOMY  1975   APPENDECTOMY  1972   FOOT SURGERY     LEFT   KYPHOPLASTY Bilateral 03/08/2014   Procedure: Thoracic nine Kyphoplasty;  Surgeon: Coletta Memos, MD;  Location: [facility];  Service: Neurosurgery;  Laterality: Bilateral;  Thoracic nine Kyphoplasty   LIPOMA EXCISION     back   TONSILLECTOMY     WRIST GANGLION EXCISION     left     A IV Location/Drains/Wounds Patient Lines/Drains/Airways Status     Active Line/Drains/Airways     Name Placement date Placement time Site Days   Peripheral IV --  --  --  --   Peripheral IV 12/24/22 20 G 1" Right Antecubital 12/24/22  1357  Antecubital  less than 1            Intake/Output Last 24 hours No intake or output data in the 24 hours ending 12/24/22 1501  Labs/Imaging Results for orders placed or performed during the hospital encounter of 12/24/22 (from the past 48 hours)  CBC with Differential     Status:  Abnormal   Collection Time: 12/24/22  2:00 PM  Result Value Ref Range   WBC 10.2 4.0 - 10.5 K/uL   RBC 4.04 3.87 - 5.11 MIL/uL   Hemoglobin 11.2 (L) 12.0 - 15.0 g/dL   HCT 98.1 (L) 19.1 - 47.8 %   MCV 88.1 80.0 - 100.0 fL   MCH 27.7 26.0 - 34.0 pg   MCHC 31.5 30.0 - 36.0 g/dL   RDW 29.5 62.1 - 30.8 %   Platelets 211 150 - 400 K/uL   nRBC 0.0 0.0 - 0.2 %   Neutrophils Relative % 84 %   Neutro Abs 8.5 (H) 1.7 - 7.7 K/uL   Lymphocytes Relative 10 %   Lymphs Abs 1.1 0.7 - 4.0 K/uL   Monocytes Relative 5 %   Monocytes Absolute 0.5 0.1 - 1.0 K/uL   Eosinophils Relative 1 %   Eosinophils Absolute 0.1 0.0 - 0.5 K/uL   Basophils Relative 0 %   Basophils Absolute 0.0 0.0 - 0.1 K/uL   Immature Granulocytes 0 %   Abs Immature Granulocytes 0.03 0.00 - 0.07 K/uL    Comment:  Performed at [facility], [address], [place], [place] [address]  Basic metabolic panel     Status: Abnormal   Collection Time: 12/24/22  2:00 PM  Result Value Ref Range   Sodium 135 135 - 145 mmol/L   Potassium 4.1 3.5 - 5.1 mmol/L   Chloride 105 98 - 111 mmol/L   CO2 19 (L) 22 - 32 mmol/L   Glucose, Bld 103 (H) 70 - 99 mg/dL    Comment: Glucose reference range applies only to samples taken after fasting for at least 8 hours.   BUN 22 8 - 23 mg/dL   Creatinine, Ser 6.96 (H) 0.44 - 1.00 mg/dL   Calcium 9.8 8.9 - 29.5 mg/dL   GFR, Estimated 36 (L) >60 mL/min    Comment: (NOTE) Calculated using the CKD-EPI Creatinine Equation (2021)    Anion gap 11 5 - 15    Comment: Performed at [facility], [address], [place], [place] [address]   DG Hip Lucienne Capers or Wo Pelvis 2-3 Views Left Result Date: 12/24/2022 CLINICAL DATA:  Left hip pain status post fall EXAM: DG HIP (WITH OR WITHOUT PELVIS) 2-3V LEFT COMPARISON:  CT abdomen pelvis with contrast FINDINGS: Severe degenerative changes of the lower lumbar spine and right hip. Atypical femoral head lucency may be due to fracture or atypical osteopenia, given appearance on prior CT. However, fracture can not be completely excluded. IMPRESSION: Lucency of the left femoral head may be due to atypical pattern of osteopenia based on appearance on prior CT from 07/14/2016. However, fracture can not be conclusively excluded. Further evaluation with noncontrast left hip CT is recommended. Electronically Signed   By: Acquanetta Belling M.D.   On: 12/24/2022 13:59   CT Head Wo Contrast Result Date: 12/24/2022 CLINICAL DATA:  Ataxia, acute, traumatic.  Fall. EXAM: CT HEAD WITHOUT CONTRAST TECHNIQUE: Contiguous axial images were obtained from the base of the skull through the vertex without intravenous contrast. RADIATION DOSE REDUCTION: This exam was performed according to the departmental dose-optimization program which includes automated exposure  control, adjustment of the mA and/or kV according to patient size and/or use of iterative reconstruction technique. COMPARISON:  Head CT 10/19/2017 FINDINGS: Brain: There is no evidence of an acute infarct, intracranial hemorrhage, mass, midline shift, or extra-axial fluid collection. Patchy to confluent hypodensities in the cerebral white  matter have mildly progressed and are nonspecific but compatible with moderate to severe chronic small vessel ischemic disease. Mild cerebral atrophy is within normal limits for age. Vascular: Calcified atherosclerosis at the skull base. No hyperdense vessel. Skull: No acute fracture or suspicious osseous lesion. Sinuses/Orbits: Focal mucosal thickening or small retention cyst inferomedially in the left frontal sinus. Clear mastoid air cells. Bilateral cataract extraction. Other: None. IMPRESSION: 1. No evidence of acute intracranial abnormality. 2. Moderate to severe chronic small vessel ischemic disease. Electronically Signed   By: Sebastian Ache M.D.   On: 12/24/2022 13:22    Pending Labs Unresulted Labs (From admission, onward)    None       Vitals/Pain Today's Vitals   12/24/22 1156 12/24/22 1202  BP: (!) 143/57   Pulse: 71   Resp: (!) 24   Temp: 98.2 F (36.8 C)   TempSrc: Oral   SpO2: 90%   Weight:  37.2 kg  Height:  5\' 1"  (1.549 m)  PainSc:  10-Worst pain ever    Isolation Precautions No active isolations  Medications Medications  morphine (PF) 4 MG/ML injection 4 mg (4 mg Intravenous Given 12/24/22 1404)  ondansetron (ZOFRAN) injection 4 mg (4 mg Intravenous Given 12/24/22 1359)  acetaminophen (TYLENOL) tablet 1,000 mg (1,000 mg Oral Given 12/24/22 1405)    Mobility walks     Focused Assessments Ortho, pain   R Recommendations: See Admitting Provider Note  Report given to:   Additional Notes: Pt is AO, walky/talky, purewick in place, on a hospital bed, able to verbalize needs, 3L [place]  \

## 2022-12-25 ENCOUNTER — Observation Stay (HOSPITAL_COMMUNITY): Payer: PPO | Admitting: Anesthesiology

## 2022-12-25 ENCOUNTER — Encounter: Payer: PPO | Admitting: Registered Nurse

## 2022-12-25 DIAGNOSIS — M069 Rheumatoid arthritis, unspecified: Secondary | ICD-10-CM | POA: Diagnosis not present

## 2022-12-25 DIAGNOSIS — I129 Hypertensive chronic kidney disease with stage 1 through stage 4 chronic kidney disease, or unspecified chronic kidney disease: Secondary | ICD-10-CM | POA: Diagnosis not present

## 2022-12-25 DIAGNOSIS — M6259 Muscle wasting and atrophy, not elsewhere classified, multiple sites: Secondary | ICD-10-CM | POA: Diagnosis not present

## 2022-12-25 DIAGNOSIS — Z825 Family history of asthma and other chronic lower respiratory diseases: Secondary | ICD-10-CM | POA: Diagnosis not present

## 2022-12-25 DIAGNOSIS — Z87891 Personal history of nicotine dependence: Secondary | ICD-10-CM | POA: Diagnosis not present

## 2022-12-25 DIAGNOSIS — Z888 Allergy status to other drugs, medicaments and biological substances status: Secondary | ICD-10-CM | POA: Diagnosis not present

## 2022-12-25 DIAGNOSIS — D631 Anemia in chronic kidney disease: Secondary | ICD-10-CM | POA: Diagnosis not present

## 2022-12-25 DIAGNOSIS — Z7952 Long term (current) use of systemic steroids: Secondary | ICD-10-CM | POA: Diagnosis not present

## 2022-12-25 DIAGNOSIS — M6281 Muscle weakness (generalized): Secondary | ICD-10-CM | POA: Diagnosis not present

## 2022-12-25 DIAGNOSIS — Z811 Family history of alcohol abuse and dependence: Secondary | ICD-10-CM | POA: Diagnosis not present

## 2022-12-25 DIAGNOSIS — Z681 Body mass index (BMI) 19 or less, adult: Secondary | ICD-10-CM | POA: Diagnosis not present

## 2022-12-25 DIAGNOSIS — M25552 Pain in left hip: Secondary | ICD-10-CM | POA: Diagnosis not present

## 2022-12-25 DIAGNOSIS — I7300 Raynaud's syndrome without gangrene: Secondary | ICD-10-CM | POA: Diagnosis not present

## 2022-12-25 DIAGNOSIS — Z7401 Bed confinement status: Secondary | ICD-10-CM | POA: Diagnosis not present

## 2022-12-25 DIAGNOSIS — Z800 Family history of malignant neoplasm of digestive organs: Secondary | ICD-10-CM | POA: Diagnosis not present

## 2022-12-25 DIAGNOSIS — Z96642 Presence of left artificial hip joint: Secondary | ICD-10-CM | POA: Diagnosis not present

## 2022-12-25 DIAGNOSIS — Z833 Family history of diabetes mellitus: Secondary | ICD-10-CM | POA: Diagnosis not present

## 2022-12-25 DIAGNOSIS — G8918 Other acute postprocedural pain: Secondary | ICD-10-CM | POA: Diagnosis not present

## 2022-12-25 DIAGNOSIS — S72002A Fracture of unspecified part of neck of left femur, initial encounter for closed fracture: Secondary | ICD-10-CM | POA: Diagnosis not present

## 2022-12-25 DIAGNOSIS — N1832 Chronic kidney disease, stage 3b: Secondary | ICD-10-CM | POA: Diagnosis not present

## 2022-12-25 DIAGNOSIS — S72002D Fracture of unspecified part of neck of left femur, subsequent encounter for closed fracture with routine healing: Secondary | ICD-10-CM | POA: Diagnosis not present

## 2022-12-25 DIAGNOSIS — R531 Weakness: Secondary | ICD-10-CM | POA: Diagnosis not present

## 2022-12-25 DIAGNOSIS — W010XXA Fall on same level from slipping, tripping and stumbling without subsequent striking against object, initial encounter: Secondary | ICD-10-CM | POA: Diagnosis present

## 2022-12-25 DIAGNOSIS — E8721 Acute metabolic acidosis: Secondary | ICD-10-CM | POA: Diagnosis not present

## 2022-12-25 DIAGNOSIS — K219 Gastro-esophageal reflux disease without esophagitis: Secondary | ICD-10-CM | POA: Diagnosis not present

## 2022-12-25 DIAGNOSIS — E43 Unspecified severe protein-calorie malnutrition: Secondary | ICD-10-CM | POA: Insufficient documentation

## 2022-12-25 DIAGNOSIS — Z841 Family history of disorders of kidney and ureter: Secondary | ICD-10-CM | POA: Diagnosis not present

## 2022-12-25 DIAGNOSIS — S72012A Unspecified intracapsular fracture of left femur, initial encounter for closed fracture: Secondary | ICD-10-CM | POA: Diagnosis not present

## 2022-12-25 DIAGNOSIS — Z9181 History of falling: Secondary | ICD-10-CM | POA: Diagnosis not present

## 2022-12-25 DIAGNOSIS — Z471 Aftercare following joint replacement surgery: Secondary | ICD-10-CM | POA: Diagnosis not present

## 2022-12-25 DIAGNOSIS — Z882 Allergy status to sulfonamides status: Secondary | ICD-10-CM | POA: Diagnosis not present

## 2022-12-25 DIAGNOSIS — G629 Polyneuropathy, unspecified: Secondary | ICD-10-CM | POA: Diagnosis not present

## 2022-12-25 DIAGNOSIS — Z90710 Acquired absence of both cervix and uterus: Secondary | ICD-10-CM | POA: Diagnosis not present

## 2022-12-25 DIAGNOSIS — Z4789 Encounter for other orthopedic aftercare: Secondary | ICD-10-CM | POA: Diagnosis not present

## 2022-12-25 DIAGNOSIS — I10 Essential (primary) hypertension: Secondary | ICD-10-CM | POA: Diagnosis not present

## 2022-12-25 DIAGNOSIS — Y92000 Kitchen of unspecified non-institutional (private) residence as  the place of occurrence of the external cause: Secondary | ICD-10-CM | POA: Diagnosis not present

## 2022-12-25 DIAGNOSIS — S72001A Fracture of unspecified part of neck of right femur, initial encounter for closed fracture: Secondary | ICD-10-CM | POA: Insufficient documentation

## 2022-12-25 DIAGNOSIS — S72042A Displaced fracture of base of neck of left femur, initial encounter for closed fracture: Secondary | ICD-10-CM | POA: Diagnosis not present

## 2022-12-25 DIAGNOSIS — E785 Hyperlipidemia, unspecified: Secondary | ICD-10-CM | POA: Diagnosis not present

## 2022-12-25 DIAGNOSIS — Z8249 Family history of ischemic heart disease and other diseases of the circulatory system: Secondary | ICD-10-CM | POA: Diagnosis not present

## 2022-12-25 DIAGNOSIS — Z8041 Family history of malignant neoplasm of ovary: Secondary | ICD-10-CM | POA: Diagnosis not present

## 2022-12-25 DIAGNOSIS — Z79899 Other long term (current) drug therapy: Secondary | ICD-10-CM | POA: Diagnosis not present

## 2022-12-25 LAB — IRON AND TIBC
Iron: 33 ug/dL (ref 28–170)
Saturation Ratios: 16 % (ref 10.4–31.8)
TIBC: 202 ug/dL — ABNORMAL LOW (ref 250–450)
UIBC: 169 ug/dL

## 2022-12-25 LAB — URINALYSIS, COMPLETE (UACMP) WITH MICROSCOPIC
Bacteria, UA: NONE SEEN
Bilirubin Urine: NEGATIVE
Glucose, UA: NEGATIVE mg/dL
Hgb urine dipstick: NEGATIVE
Ketones, ur: NEGATIVE mg/dL
Leukocytes,Ua: NEGATIVE
Nitrite: NEGATIVE
Protein, ur: NEGATIVE mg/dL
Specific Gravity, Urine: 1.012 (ref 1.005–1.030)
pH: 6 (ref 5.0–8.0)

## 2022-12-25 LAB — FOLATE: Folate: >40 ng/mL (ref >5.9)

## 2022-12-25 LAB — BASIC METABOLIC PANEL
Anion gap: 6 (ref 5–15)
BUN: 20 mg/dL (ref 8–23)
CO2: 22 mmol/L (ref 22–32)
Calcium: 9.2 mg/dL (ref 8.9–10.3)
Chloride: 106 mmol/L (ref 98–111)
Creatinine, Ser: 1.56 mg/dL — ABNORMAL HIGH (ref 0.44–1.00)
GFR, Estimated: 34 mL/min — ABNORMAL LOW (ref >60)
Glucose, Bld: 68 mg/dL — ABNORMAL LOW (ref 70–99)
Potassium: 4.8 mmol/L (ref 3.5–5.1)
Sodium: 134 mmol/L — ABNORMAL LOW (ref 135–145)

## 2022-12-25 LAB — TYPE AND SCREEN
ABO/RH(D): A POS
Antibody Screen: NEGATIVE

## 2022-12-25 LAB — FERRITIN: Ferritin: 699 ng/mL — ABNORMAL HIGH (ref 11–307)

## 2022-12-25 LAB — VITAMIN B12: Vitamin B-12: 3866 pg/mL — ABNORMAL HIGH (ref 180–914)

## 2022-12-25 LAB — CBC
HCT: 34.3 % — ABNORMAL LOW (ref 36.0–46.0)
Hemoglobin: 10.8 g/dL — ABNORMAL LOW (ref 12.0–15.0)
MCH: 27.9 pg (ref 26.0–34.0)
MCHC: 31.5 g/dL (ref 30.0–36.0)
MCV: 88.6 fL (ref 80.0–100.0)
Platelets: 183 10*3/uL (ref 150–400)
RBC: 3.87 MIL/uL (ref 3.87–5.11)
RDW: 14.2 % (ref 11.5–15.5)
WBC: 11.3 10*3/uL — ABNORMAL HIGH (ref 4.0–10.5)
nRBC: 0 % (ref 0.0–0.2)

## 2022-12-25 LAB — SURGICAL PCR SCREEN
MRSA, PCR: NEGATIVE
Staphylococcus aureus: NEGATIVE

## 2022-12-25 LAB — VITAMIN D 25 HYDROXY (VIT D DEFICIENCY, FRACTURES): Vit D, 25-Hydroxy: 39.43 ng/mL (ref 30–100)

## 2022-12-25 LAB — ABO/RH: ABO/RH(D): A POS

## 2022-12-25 LAB — ALBUMIN: Albumin: 3 g/dL — ABNORMAL LOW (ref 3.5–5.0)

## 2022-12-25 MED ORDER — DEXAMETHASONE SODIUM PHOSPHATE 4 MG/ML IJ SOLN
INTRAMUSCULAR | Status: DC | PRN
  Administered 2022-12-25: 10 mg via PERINEURAL

## 2022-12-25 MED ORDER — NEPRO/CARBSTEADY PO LIQD
237.0000 mL | Freq: Two times a day (BID) | ORAL | Status: DC
Start: 2022-12-26 — End: 2022-12-30
  Administered 2022-12-27 – 2022-12-30 (×7): 237 mL via ORAL

## 2022-12-25 MED ORDER — BUPIVACAINE HCL (PF) 0.5 % IJ SOLN
INTRAMUSCULAR | Status: DC | PRN
  Administered 2022-12-25: 20 mL via PERINEURAL

## 2022-12-25 MED ORDER — SODIUM CHLORIDE 0.9 % IV SOLN
INTRAVENOUS | Status: AC
Start: 2022-12-25 — End: 2022-12-26

## 2022-12-25 MED ORDER — FENTANYL CITRATE (PF) 100 MCG/2ML IJ SOLN
INTRAMUSCULAR | Status: AC
  Administered 2022-12-25: 50 ug
  Filled 2022-12-25: qty 2

## 2022-12-25 NOTE — Progress Notes (Signed)
Initial Nutrition Assessment  DOCUMENTATION CODES:   Severe malnutrition in context of chronic illness  INTERVENTION:   -Continue liberalized diet with encouragement.  -Provide tray set up, open packages and cartons all meals. -Supplement meals with Nepro Shake po BID, each supplement provides 425 kcal and 19 grams protein for repletion. -Continue MVI/Minerals-1 Tab daily due to prolonged inadequate nutritional intakes.    NUTRITION DIAGNOSIS:   Severe Malnutrition related to chronic illness as evidenced by percent weight loss, energy intake < 75% for > or equal to 3 months, per patient/family report, severe fat depletion, mild fat depletion, moderate fat depletion, severe muscle depletion, moderate muscle depletion.    GOAL:   Patient will meet greater than or equal to 90% of their needs    MONITOR:   PO intake, Supplement acceptance, Skin, Labs, Weight trends  REASON FOR ASSESSMENT:   Consult Assessment of nutrition requirement/status  ASSESSMENT: 77 y/o female  PMH: Raynaud's syndrome, CKD3, DDD, GERD, HTN, PUD, abnormal LFTs, anemia, anxiety, hypercalcemia, headache, HLD, hyperkalemia, IBS, lupus, mixed connective tissue disorder, nausea, neuropathy, small kidney, vitamin D deficiency, smoking hx.  Patient states she has had a poor appetite for unknown reason lasting for months.  Denied nausea or taste changes. Then approximately three weeks ago appetite returned and she has improved intakes. Usually consumes two meals daily at home, B of eggs, bacon or sausage, wheat toast, decaf coffee or green tea, D consists of chicken, green beans, squash or cabbage, occasional potato. She was following a low potassium diet per kidney doctor recommendation however notes this has not been ongoing as of recent, states her kidney function improved. Patient informs she is a very slow eater and was eating L for two hours today, needs assist with tray set up. Poor fitting dentures.  Notes  she is finding adequate soft foods on the regular menu. Per review of EMR, weight change of 23% within 4.25 months.    Medications reviewed and include acidophilus, vitamin D3 25 mcg daily, cyanocobalamin 1,000 mcg daily, ferrous gluconate 324 mg 2x daily, MVI/Minerals-1 Tab daily, PPI, prednisone, vitamin B6 100 mg daily, senna.  Labs: glucose 68  Nutrition Focused Physical Exam  Flowsheet Row Most Recent Value  Orbital Region No depletion  Upper Arm Region Severe depletion  Thoracic and Lumbar Region Mild depletion  Buccal Region Moderate depletion  Temple Region Severe depletion  Clavicle Bone Region Severe depletion  Clavicle and Acromion Bone Region Severe depletion  Scapular Bone Region Moderate depletion  Dorsal Hand Moderate depletion  Patellar Region Moderate depletion  Anterior Thigh Region Moderate depletion  Posterior Calf Region Moderate depletion  Edema (RD Assessment) None  Hair Reviewed  Eyes Reviewed  Mouth Reviewed  Skin Reviewed  Nails Reviewed         Diet Order:   Diet Order             Diet regular Room service appropriate? Yes with Assist; Fluid consistency: Thin  Diet effective now                   EDUCATION NEEDS:   Not appropriate for education at this time  Skin:  Skin Assessment: Reviewed RN Assessment  Last BM:  12/23/22  Height:   Ht Readings from Last 1 Encounters:  12/24/22 5\' 1"  (1.549 m)    Weight:   Wt Readings from Last 1 Encounters:  12/24/22 37.2 kg    Ideal Body Weight:  50 kg  BMI:  Body mass index is  15.49 kg/m.  Estimated Nutritional Needs:   Kcal:  1300-1500 kcal/day  Protein:  56-74 gm/day  Fluid:  1500 mL/day    Alvino Chapel, RDLD Clinical Dietitian If unable to reach, please contact "RD Inpatient" secure chat group between 8 am-4 pm daily"

## 2022-12-25 NOTE — Progress Notes (Signed)
PROGRESS NOTE    Holly Jimenez  WGN:[id number] DOB: 1945-11-18 DOA: 12/24/2022 PCP: Irena Reichmann, DO    Chief Complaint  Patient presents with   Fall    Brief Narrative:  Patient pleasant 77 year old female with history of Raynaud's disease, CKD stage IIIb, DDD, GERD, osteoarthritis, hypertension, peptic ulcer disease presented to the ED with sudden onset left hip pain after mechanical fall.  Patient seen in the ED, assessed, CT left hip done with an acute subcapital proximal left femoral neck fracture with mild impaction and displacement.  Orthopedics consulted.   Assessment & Plan:   Principal Problem:   Closed displaced fracture of left femoral neck (HCC) Active Problems:   Ulcer of the stomach and intestine   Raynaud disease   Essential hypertension   Gait abnormality   Acute pain of left hip   CKD stage 3b, GFR 30-44 ml/min (HCC)   Protein-calorie malnutrition, severe   #1 acute subcapital proximal left femoral neck fracture with mild impaction and displacement, POA -Secondary to mechanical fall. -Plain films obtained initially on admission of the left hip and pelvis with lucency of the left femoral head may be due to atypical pattern of osteopenia based on appearance on prior CT, fracture cannot be ruled out conclusively further evaluation with noncontrast left hip CT recommended. -CT of the left hip obtained which showed an acute subcapital left proximal left femoral neck fracture with mild impaction and displacement. -Anesthesia consulted and patient underwent nerve block this morning 12/25/2022 with clinical improvement. -Orthopedics consulted who assessed the patient and recommending THA, surgeon and timing yet to be determined. -Patient placed back on diet. -IV fluids, IV antiemetics, IV pain management. -Per orthopedics.  2.  Peptic ulcer disease -PPI  3.  Hypertension -BP noted to be soft this morning, patient however had received  antihypertensive medications already. -Discontinue Cozaar and Norvasc. -IV fluids. -Follow-up.  4.  CKD stage IIIb -Stable.  5.  Raynaud's disease -Continue home regimen Plaquenil, Arava.  6.  Anemia of chronic disease -Patient with no overt bleeding. -H&H stable. -Anemia panel with a iron level of 33, TIBC of 202, ferritin of 699, folate >40, B12 3866. -Follow H&H. -Transfusion threshold hemoglobin < 7.    DVT prophylaxis: Heparin Code Status: Full Family Communication: Updated patient.  No family at bedside. Disposition: TBD   Status is: Inpatient Remains inpatient appropriate because: Severity of illness   Consultants:  Orthopedics: Earney Hamburg, PA- 12/25/2022  Procedures:  CT left hip 12/24/2022 Plain films of the left hip and pelvis 12/24/2022   Antimicrobials:  Anti-infectives (From admission, onward)    Start     Dose/Rate Route Frequency Ordered Stop   12/24/22 2200  hydroxychloroquine (PLAQUENIL) tablet 200 mg        200 mg Oral 2 times daily 12/24/22 1554           Subjective: Patient laying in bed eating lunch.  States some improvement with left hip pain after nerve block was performed this morning.  Denies any chest pain or shortness of breath.  No abdominal pain.  States saw orthopedics who are going to determine when she will have her surgery.  Objective: Vitals:   12/25/22 0430 12/25/22 0753 12/25/22 0941 12/25/22 0945  BP: (!) 130/55 100/78 (!) 118/99 (!) 102/52  Pulse: 68 64 64 64  Resp: 17 16 13 14   Temp: 97.9 F (36.6 C) 99.6 F (37.6 C) 98.5 F (36.9 C)   TempSrc:  Oral    SpO2: 100%  94% (!) 87% 94%  Weight:      Height:        Intake/Output Summary (Last 24 hours) at 12/25/2022 1700 Last data filed at 12/25/2022 0646 Gross per 24 hour  Intake 717.63 ml  Output 600 ml  Net 117.63 ml   Filed Weights   12/24/22 1202  Weight: 37.2 kg    Examination:  General exam: Appears calm and comfortable  Respiratory system:  Clear to auscultation anterior lung fields.  No wheezes, no crackles, no rhonchi.  Fair air movement.  Speaking in full sentences.Marland Kitchen Respiratory effort normal. Cardiovascular system: S1 & S2 heard, RRR. No JVD, murmurs, rubs, gallops or clicks. No pedal edema. Gastrointestinal system: Abdomen is nondistended, soft and nontender. No organomegaly or masses felt. Normal bowel sounds heard. Central nervous system: Alert and oriented. No focal neurological deficits. Extremities: Left hip with some tenderness to palpation.  No edema.  No clubbing cyanosis or edema. Skin: No rashes, lesions or ulcers Psychiatry: Judgement and insight appear normal. Mood & affect appropriate.     Data Reviewed: I have personally reviewed following labs and imaging studies  CBC: Recent Labs  Lab 12/24/22 1400 12/25/22 0600  WBC 10.2 11.3*  NEUTROABS 8.5*  --   HGB 11.2* 10.8*  HCT 35.6* 34.3*  MCV 88.1 88.6  PLT 211 183    Basic Metabolic Panel: Recent Labs  Lab 12/24/22 1400 12/25/22 0600  NA 135 134*  K 4.1 4.8  CL 105 106  CO2 19* 22  GLUCOSE 103* 68*  BUN 22 20  CREATININE 1.49* 1.56*  CALCIUM 9.8 9.2  MG 2.1  --   PHOS 3.2  --     GFR: Estimated Creatinine Clearance: 17.2 mL/min (A) (by C-G formula based on SCr of 1.56 mg/dL (H)).  Liver Function Tests: Recent Labs  Lab 12/25/22 0600  ALBUMIN 3.0*    CBG: No results for input(s): "GLUCAP" in the last 168 hours.   Recent Results (from the past 240 hours)  Surgical pcr screen     Status: None   Collection Time: 12/25/22  5:00 AM   Specimen: Nasal Mucosa; Nasal Swab  Result Value Ref Range Status   MRSA, PCR NEGATIVE NEGATIVE Final   Staphylococcus aureus NEGATIVE NEGATIVE Final    Comment: (NOTE) The Xpert SA Assay (FDA approved for NASAL specimens in patients 26 years of age and older), is one component of a comprehensive surveillance program. It is not intended to diagnose infection nor to guide or monitor  treatment. Performed at [facility], [address], [place], [place] [address]          Radiology Studies: CT Hip Left Wo Contrast Result Date: 12/24/2022 CLINICAL DATA:  Hip trauma.  Fracture suspected.  Fall. EXAM: CT OF THE LEFT HIP WITHOUT CONTRAST TECHNIQUE: Multidetector CT imaging of the left hip was performed according to the standard protocol. Multiplanar CT image reconstructions were also generated. RADIATION DOSE REDUCTION: This exam was performed according to the departmental dose-optimization program which includes automated exposure control, adjustment of the mA and/or kV according to patient size and/or use of iterative reconstruction technique. COMPARISON:  Pelvis and left hip radiographs 12/24/2022, CT abdomen and pelvis 07/14/2016 FINDINGS: Bones/Joint/Cartilage There is a band of sclerosis throughout the superolateral aspect of the left femoral head. Broad region of sclerosis is also seen within the medial the posteromedial aspect of the left femoral head. There is an acute subcapital proximal left femoral neck fracture with mild impaction. Approximately  2 mm inferior and 3 mm anterior displacement of the distal fracture component with respect to the proximal fracture component. Mild anterior superior left acetabular degenerative osteophytosis. Moderate anterior superior left femoroacetabular joint space narrowing. Mild left sacroiliac subchondral sclerosis and vacuum phenomenon degenerative change. Ligaments Suboptimally assessed by CT. Muscles and Tendons Mild atrophy and fatty infiltration of the left gluteus minimus muscle. Soft tissues Moderate distention of the partially visualized urinary bladder. There is increased density and stranding within the posterolateral left buttock subcutaneous fat, likely from recent fall/soft tissue contusion. Multiple peripherally calcified granulomas are seen within the left buttock subcutaneous fat. These are similar to 07/14/2016 CT.  IMPRESSION: 1. Acute subcapital proximal left femoral neck fracture with mild impaction and displacement. 2. Band of sclerosis throughout the superolateral aspect of the left femoral head. Broad region of sclerosis is also seen within the medial and posteromedial aspect of the left femoral head. This is again suspicious for avascular necrosis. No overlying cortical collapse. Electronically Signed   By: Neita Garnet M.D.   On: 12/24/2022 16:58   DG Hip Unilat W or Wo Pelvis 2-3 Views Left Result Date: 12/24/2022 CLINICAL DATA:  Left hip pain status post fall EXAM: DG HIP (WITH OR WITHOUT PELVIS) 2-3V LEFT COMPARISON:  CT abdomen pelvis with contrast FINDINGS: Severe degenerative changes of the lower lumbar spine and right hip. Atypical femoral head lucency may be due to fracture or atypical osteopenia, given appearance on prior CT. However, fracture can not be completely excluded. IMPRESSION: Lucency of the left femoral head may be due to atypical pattern of osteopenia based on appearance on prior CT from 07/14/2016. However, fracture can not be conclusively excluded. Further evaluation with noncontrast left hip CT is recommended. Electronically Signed   By: Acquanetta Belling M.D.   On: 12/24/2022 13:59   CT Head Wo Contrast Result Date: 12/24/2022 CLINICAL DATA:  Ataxia, acute, traumatic.  Fall. EXAM: CT HEAD WITHOUT CONTRAST TECHNIQUE: Contiguous axial images were obtained from the base of the skull through the vertex without intravenous contrast. RADIATION DOSE REDUCTION: This exam was performed according to the departmental dose-optimization program which includes automated exposure control, adjustment of the mA and/or kV according to patient size and/or use of iterative reconstruction technique. COMPARISON:  Head CT 10/19/2017 FINDINGS: Brain: There is no evidence of an acute infarct, intracranial hemorrhage, mass, midline shift, or extra-axial fluid collection. Patchy to confluent hypodensities in the  cerebral white matter have mildly progressed and are nonspecific but compatible with moderate to severe chronic small vessel ischemic disease. Mild cerebral atrophy is within normal limits for age. Vascular: Calcified atherosclerosis at the skull base. No hyperdense vessel. Skull: No acute fracture or suspicious osseous lesion. Sinuses/Orbits: Focal mucosal thickening or small retention cyst inferomedially in the left frontal sinus. Clear mastoid air cells. Bilateral cataract extraction. Other: None. IMPRESSION: 1. No evidence of acute intracranial abnormality. 2. Moderate to severe chronic small vessel ischemic disease. Electronically Signed   By: Sebastian Ache M.D.   On: 12/24/2022 13:22        Scheduled Meds:  acetaminophen  1,000 mg Oral Q8H   acidophilus  1 capsule Oral Daily   cholecalciferol  1,000 Units Oral Daily   cyanocobalamin  1,000 mcg Oral Daily   [START ON 12/26/2022] feeding supplement (NEPRO CARB STEADY)  237 mL Oral BID BM   ferrous gluconate  324 mg Oral BID   heparin  5,000 Units Subcutaneous Q8H   hydroxychloroquine  200 mg Oral BID  leflunomide  10 mg Oral Daily   loratadine  10 mg Oral Daily   multivitamin with minerals  1 tablet Oral Daily   oxybutynin  5 mg Oral BID   pantoprazole  40 mg Oral Daily   predniSONE  5 mg Oral Q breakfast   pregabalin  75 mg Oral Daily   pyridOXINE  100 mg Oral Daily   senna  1 tablet Oral BID   sodium chloride flush  3 mL Intravenous Q12H   Continuous Infusions:  sodium chloride       LOS: 0 days    Time spent: 40 minutes    Ramiro Harvest, MD [facility]   To contact the attending provider between 7A-7P or the covering provider during after hours 7P-7A, please log into the web site [email or web address] and access using universal [place] password for that web site. If you do not have the password, please call the hospital operator.  12/25/2022, 5:00 PM

## 2022-12-25 NOTE — Anesthesia Procedure Notes (Signed)
Anesthesia Regional Block: Peng block   Pre-Anesthetic Checklist: , timeout performed,  Correct Patient, Correct Site, Correct Laterality,  Correct Procedure, Correct Position, site marked,  Risks and benefits discussed,  Surgical consent,  Pre-op evaluation,  At surgeon's request and post-op pain management  Laterality: Left  Prep: Maximum Sterile Barrier Precautions used, chloraprep       Needles:  Injection technique: Single-shot  Needle Type: Echogenic Stimulator Needle     Needle Length: 9cm  Needle Gauge: 22     Additional Needles:   Procedures:,,,, ultrasound used (permanent image in chart),,    Narrative:  Start time: 12/25/2022 9:45 AM End time: 12/25/2022 9:55 AM Injection made incrementally with aspirations every 5 mL.  Performed by: Personally  Anesthesiologist: Lannie Fields, DO  Additional Notes: Monitors applied. No increased pain on injection. No increased resistance to injection. Injection made in 5cc increments. Good needle visualization. Patient tolerated procedure well.

## 2022-12-25 NOTE — TOC CAGE-AID Note (Signed)
Transition of Care [facility] - CAGE-AID Screening  Patient Details  Name: KAYLEIGHANN PATCH MRN: [id number] Date of Birth: May 01, 1945  Clinical Narrative:  Patient denies any alcohol or drug use, no need for resources at this time.  CAGE-AID Screening:   Have You Ever Felt You Ought to Cut Down on Your Drinking or Drug Use?: No Have People Annoyed You By Critizing Your Drinking Or Drug Use?: No Have You Felt Bad Or Guilty About Your Drinking Or Drug Use?: No Have You Ever Had a Drink or Used Drugs First Thing In The Morning to Steady Your Nerves or to Get Rid of a Hangover?: No CAGE-AID Score: 0  Substance Abuse Education Offered: No

## 2022-12-25 NOTE — Consult Note (Signed)
Reason for Consult:Left hip fx Referring Physician: Ramiro Harvest Time called: 0809 Time at bedside: 0854   Holly Jimenez is an 77 y.o. female.  HPI: Holly Jimenez slipped on her kitchen floor and fell yesterday. She had immediate left hip pain and could not get up. She was brought to the ED where x-rays showed a left hip fx and orthopedic surgery was consulted. She lives at home alone and uses a cane to ambulate.  Past Medical History:  Diagnosis Date   Abnormal LFTs    Allergic rhinitis    Anemia    Anxiety    Arthritis    Chronic back pain    Chronic headaches    Chronic kidney disease, unspecified    DDD (degenerative disc disease)    GERD (gastroesophageal reflux disease)    Headache    Hypercalcemia    Hyperkalemia    Hyperlipidemia    Hypertension    KIDNEY SPECIALIST TOOK OFF BENICAR   IBS (irritable bowel syndrome)    Lupus    Mixed connective tissue disease (HCC)    Nausea    Neuropathy    Peptic ulcer disease    Raynaud's disease    Small kidney    left   Vitamin D deficiency disease     Past Surgical History:  Procedure Laterality Date   ABDOMINAL HYSTERECTOMY  1975   APPENDECTOMY  1972   FOOT SURGERY     LEFT   KYPHOPLASTY Bilateral 03/08/2014   Procedure: Thoracic nine Kyphoplasty;  Surgeon: Coletta Memos, MD;  Location: [facility];  Service: Neurosurgery;  Laterality: Bilateral;  Thoracic nine Kyphoplasty   LIPOMA EXCISION     back   TONSILLECTOMY     WRIST GANGLION EXCISION     left    Family History  Problem Relation Age of Onset   Ovarian cancer Sister    Colon cancer Sister    Heart disease Paternal Grandfather    Heart disease Maternal Grandfather    Kidney disease Brother    Kidney disease Mother    Hypertension Mother    Kidney disease Maternal Grandmother    Diabetes Paternal Grandmother    Asthma Father    Suicidality Father    Alcoholism Father     Social History:  reports that she quit smoking about 22 years ago.  Her smoking use included cigarettes. She started smoking about 47 years ago. She has a 25 pack-year smoking history. She has never used smokeless tobacco. She reports current alcohol use. She reports that she does not use drugs.  Allergies:  Allergies  Allergen Reactions   Sulfa Antibiotics     Other reaction(s): Other (See Comments)  Other Reaction(s): Other (See Comments)   Sulfonamide Derivatives Hives   Methocarbamol     Other reaction(s): nightmares   Pancrelipase (Lip-Prot-Amyl)     Other reaction(s): indigestion   Sulfacetamide Sodium-Sulfur     Other reaction(s): swelling    Medications: I have reviewed the patient's current medications.  Results for orders placed or performed during the hospital encounter of 12/24/22 (from the past 48 hours)  CBC with Differential     Status: Abnormal   Collection Time: 12/24/22  2:00 PM  Result Value Ref Range   WBC 10.2 4.0 - 10.5 K/uL   RBC 4.04 3.87 - 5.11 MIL/uL   Hemoglobin 11.2 (L) 12.0 - 15.0 g/dL   HCT 16.1 (L) 09.6 - 04.5 %   MCV 88.1 80.0 - 100.0 fL   MCH 27.7  26.0 - 34.0 pg   MCHC 31.5 30.0 - 36.0 g/dL   RDW 95.0 93.2 - 67.1 %   Platelets 211 150 - 400 K/uL   nRBC 0.0 0.0 - 0.2 %   Neutrophils Relative % 84 %   Neutro Abs 8.5 (H) 1.7 - 7.7 K/uL   Lymphocytes Relative 10 %   Lymphs Abs 1.1 0.7 - 4.0 K/uL   Monocytes Relative 5 %   Monocytes Absolute 0.5 0.1 - 1.0 K/uL   Eosinophils Relative 1 %   Eosinophils Absolute 0.1 0.0 - 0.5 K/uL   Basophils Relative 0 %   Basophils Absolute 0.0 0.0 - 0.1 K/uL   Immature Granulocytes 0 %   Abs Immature Granulocytes 0.03 0.00 - 0.07 K/uL    Comment: Performed at [facility], [address] [place], [place] [address]  Basic metabolic panel     Status: Abnormal   Collection Time: 12/24/22  2:00 PM  Result Value Ref Range   Sodium 135 135 - 145 mmol/L   Potassium 4.1 3.5 - 5.1 mmol/L   Chloride 105 98 - 111 mmol/L   CO2 19 (L) 22 - 32 mmol/L   Glucose, Bld 103 (H) 70  - 99 mg/dL    Comment: Glucose reference range applies only to samples taken after fasting for at least 8 hours.   BUN 22 8 - 23 mg/dL   Creatinine, Ser 9.98 (H) 0.44 - 1.00 mg/dL   Calcium 9.8 8.9 - 33.8 mg/dL   GFR, Estimated 36 (L) >60 mL/min    Comment: (NOTE) Calculated using the CKD-EPI Creatinine Equation (2021)    Anion gap 11 5 - 15    Comment: Performed at [facility], [address], [place], [place] [address]  Magnesium     Status: None   Collection Time: 12/24/22  2:00 PM  Result Value Ref Range   Magnesium 2.1 1.7 - 2.4 mg/dL    Comment: Performed at [facility], [address], [place], [place] [address]  Phosphorus     Status: None   Collection Time: 12/24/22  2:00 PM  Result Value Ref Range   Phosphorus 3.2 2.5 - 4.6 mg/dL    Comment: Performed at [facility], [address], [place], [place] [address]  ABO/Rh     Status: None   Collection Time: 12/24/22  2:00 PM  Result Value Ref Range   ABO/RH(D)      A POS Performed at [facility], [address] [place], [place] [address]   Surgical pcr screen     Status: None   Collection Time: 12/25/22  5:00 AM   Specimen: Nasal Mucosa; Nasal Swab  Result Value Ref Range   MRSA, PCR NEGATIVE NEGATIVE   Staphylococcus aureus NEGATIVE NEGATIVE    Comment: (NOTE) The Xpert SA Assay (FDA approved for NASAL specimens in patients 60 years of age and older), is one component of a comprehensive surveillance program. It is not intended to diagnose infection nor to guide or monitor treatment. Performed at [facility], [address], [place], [place] [address]   Urinalysis, Complete w Microscopic -Urine, Clean Catch     Status: None   Collection Time: 12/25/22  5:30 AM  Result Value Ref Range   Color, Urine YELLOW YELLOW   APPearance CLEAR CLEAR   Specific Gravity, Urine 1.012 1.005 - 1.030   pH 6.0 5.0 - 8.0   Glucose, UA NEGATIVE NEGATIVE  mg/dL   Hgb urine dipstick NEGATIVE NEGATIVE    Bilirubin Urine NEGATIVE NEGATIVE   Ketones, ur NEGATIVE NEGATIVE mg/dL   Protein, ur NEGATIVE NEGATIVE mg/dL   Nitrite NEGATIVE NEGATIVE   Leukocytes,Ua NEGATIVE NEGATIVE   RBC / HPF 0-5 0 - 5 RBC/hpf   WBC, UA 0-5 0 - 5 WBC/hpf   Bacteria, UA NONE SEEN NONE SEEN   Squamous Epithelial / HPF 0-5 0 - 5 /HPF    Comment: Performed at [facility], [address] [place], [place] [address]  Type and screen [facility]     Status: None   Collection Time: 12/25/22  5:54 AM  Result Value Ref Range   ABO/RH(D) A POS    Antibody Screen NEG    Sample Expiration      12/28/2022,2359 Performed at [facility], [address] [place], [place] [address]   Basic metabolic panel     Status: Abnormal   Collection Time: 12/25/22  6:00 AM  Result Value Ref Range   Sodium 134 (L) 135 - 145 mmol/L   Potassium 4.8 3.5 - 5.1 mmol/L   Chloride 106 98 - 111 mmol/L   CO2 22 22 - 32 mmol/L   Glucose, Bld 68 (L) 70 - 99 mg/dL    Comment: Glucose reference range applies only to samples taken after fasting for at least 8 hours.   BUN 20 8 - 23 mg/dL   Creatinine, Ser 0.98 (H) 0.44 - 1.00 mg/dL   Calcium 9.2 8.9 - 11.9 mg/dL   GFR, Estimated 34 (L) >60 mL/min    Comment: (NOTE) Calculated using the CKD-EPI Creatinine Equation (2021)    Anion gap 6 5 - 15    Comment: Performed at [facility], [address] [place], [place] [address]  CBC     Status: Abnormal   Collection Time: 12/25/22  6:00 AM  Result Value Ref Range   WBC 11.3 (H) 4.0 - 10.5 K/uL   RBC 3.87 3.87 - 5.11 MIL/uL   Hemoglobin 10.8 (L) 12.0 - 15.0 g/dL   HCT 95.6 (L) 21.3 - 08.6 %   MCV 88.6 80.0 - 100.0 fL   MCH 27.9 26.0 - 34.0 pg   MCHC 31.5 30.0 - 36.0 g/dL   RDW 57.8 46.9 - 62.9 %   Platelets 183 150 - 400 K/uL   nRBC 0.0 0.0 - 0.2 %    Comment: Performed at [facility], [address], [place], [place] [address]  VITAMIN D 25 Hydroxy (Vit-D Deficiency, Fractures)     Status:  None   Collection Time: 12/25/22  6:00 AM  Result Value Ref Range   Vit D, 25-Hydroxy 39.43 30 - 100 ng/mL    Comment: (NOTE) Vitamin D deficiency has been defined by the Institute of Medicine  and an Endocrine Society practice guideline as a level of serum 25-OH  vitamin D less than 20 ng/mL (1,2). The Endocrine Society went on to  further define vitamin D insufficiency as a level between 21 and 29  ng/mL (2).  1. IOM (Institute of Medicine). 2010. Dietary reference intakes for  calcium and D. [place]: [employer]. 2. Holick MF, Binkley Ear[place], Bischoff-Ferrari HA, et al. Evaluation,  treatment, and prevention of vitamin D deficiency: an Endocrine  Society clinical practice guideline, JCEM. 2011 Jul; 96(7): 1911-30.  Performed at [facility], [address], [place], [place] [address]   Albumin  Status: Abnormal   Collection Time: 12/25/22  6:00 AM  Result Value Ref Range   Albumin 3.0 (L) 3.5 - 5.0 g/dL    Comment: Performed at [facility], [address] [place], [place] [address]    CT Hip Left Wo Contrast Result Date: 12/24/2022 CLINICAL DATA:  Hip trauma.  Fracture suspected.  Fall. EXAM: CT OF THE LEFT HIP WITHOUT CONTRAST TECHNIQUE: Multidetector CT imaging of the left hip was performed according to the standard protocol. Multiplanar CT image reconstructions were also generated. RADIATION DOSE REDUCTION: This exam was performed according to the departmental dose-optimization program which includes automated exposure control, adjustment of the mA and/or kV according to patient size and/or use of iterative reconstruction technique. COMPARISON:  Pelvis and left hip radiographs 12/24/2022, CT abdomen and pelvis 07/14/2016 FINDINGS: Bones/Joint/Cartilage There is a band of sclerosis throughout the superolateral aspect of the left femoral head. Broad region of sclerosis is also seen within the medial the posteromedial aspect of the left femoral  head. There is an acute subcapital proximal left femoral neck fracture with mild impaction. Approximately 2 mm inferior and 3 mm anterior displacement of the distal fracture component with respect to the proximal fracture component. Mild anterior superior left acetabular degenerative osteophytosis. Moderate anterior superior left femoroacetabular joint space narrowing. Mild left sacroiliac subchondral sclerosis and vacuum phenomenon degenerative change. Ligaments Suboptimally assessed by CT. Muscles and Tendons Mild atrophy and fatty infiltration of the left gluteus minimus muscle. Soft tissues Moderate distention of the partially visualized urinary bladder. There is increased density and stranding within the posterolateral left buttock subcutaneous fat, likely from recent fall/soft tissue contusion. Multiple peripherally calcified granulomas are seen within the left buttock subcutaneous fat. These are similar to 07/14/2016 CT. IMPRESSION: 1. Acute subcapital proximal left femoral neck fracture with mild impaction and displacement. 2. Band of sclerosis throughout the superolateral aspect of the left femoral head. Broad region of sclerosis is also seen within the medial and posteromedial aspect of the left femoral head. This is again suspicious for avascular necrosis. No overlying cortical collapse. Electronically Signed   By: Neita Garnet M.D.   On: 12/24/2022 16:58   DG Hip Unilat W or Wo Pelvis 2-3 Views Left Result Date: 12/24/2022 CLINICAL DATA:  Left hip pain status post fall EXAM: DG HIP (WITH OR WITHOUT PELVIS) 2-3V LEFT COMPARISON:  CT abdomen pelvis with contrast FINDINGS: Severe degenerative changes of the lower lumbar spine and right hip. Atypical femoral head lucency may be due to fracture or atypical osteopenia, given appearance on prior CT. However, fracture can not be completely excluded. IMPRESSION: Lucency of the left femoral head may be due to atypical pattern of osteopenia based on appearance  on prior CT from 07/14/2016. However, fracture can not be conclusively excluded. Further evaluation with noncontrast left hip CT is recommended. Electronically Signed   By: Acquanetta Belling M.D.   On: 12/24/2022 13:59   CT Head Wo Contrast Result Date: 12/24/2022 CLINICAL DATA:  Ataxia, acute, traumatic.  Fall. EXAM: CT HEAD WITHOUT CONTRAST TECHNIQUE: Contiguous axial images were obtained from the base of the skull through the vertex without intravenous contrast. RADIATION DOSE REDUCTION: This exam was performed according to the departmental dose-optimization program which includes automated exposure control, adjustment of the mA and/or kV according to patient size and/or use of iterative reconstruction technique. COMPARISON:  Head CT 10/19/2017 FINDINGS: Brain: There is no evidence of an acute infarct, intracranial hemorrhage, mass, midline shift, or extra-axial fluid collection. Patchy to  confluent hypodensities in the cerebral white matter have mildly progressed and are nonspecific but compatible with moderate to severe chronic small vessel ischemic disease. Mild cerebral atrophy is within normal limits for age. Vascular: Calcified atherosclerosis at the skull base. No hyperdense vessel. Skull: No acute fracture or suspicious osseous lesion. Sinuses/Orbits: Focal mucosal thickening or small retention cyst inferomedially in the left frontal sinus. Clear mastoid air cells. Bilateral cataract extraction. Other: None. IMPRESSION: 1. No evidence of acute intracranial abnormality. 2. Moderate to severe chronic small vessel ischemic disease. Electronically Signed   By: Sebastian Ache M.D.   On: 12/24/2022 13:22    Review of Systems  HENT:  Negative for ear discharge, ear pain, hearing loss and tinnitus.   Eyes:  Negative for photophobia and pain.  Respiratory:  Negative for cough and shortness of breath.   Cardiovascular:  Negative for chest pain.  Gastrointestinal:  Negative for abdominal pain, nausea and  vomiting.  Genitourinary:  Negative for dysuria, flank pain, frequency and urgency.  Musculoskeletal:  Positive for arthralgias (Left hip). Negative for back pain, myalgias and neck pain.  Neurological:  Negative for dizziness and headaches.  Hematological:  Does not bruise/bleed easily.  Psychiatric/Behavioral:  The patient is not nervous/anxious.    Blood pressure 100/78, pulse 64, temperature 99.6 F (37.6 C), temperature source Oral, resp. rate 16, height 5\' 1"  (1.549 m), weight 37.2 kg, SpO2 94%. Physical Exam Constitutional:      General: She is not in acute distress.    Appearance: She is well-developed. She is not diaphoretic.  HENT:     Head: Normocephalic and atraumatic.  Eyes:     General: No scleral icterus.       Right eye: No discharge.        Left eye: No discharge.     Conjunctiva/sclera: Conjunctivae normal.  Cardiovascular:     Rate and Rhythm: Normal rate and regular rhythm.  Pulmonary:     Effort: Pulmonary effort is normal. No respiratory distress.  Musculoskeletal:     Cervical back: Normal range of motion.     Comments: LLE No traumatic wounds, ecchymosis, or rash  Mild TTP hip  No knee or ankle effusion  Knee stable to varus/ valgus and anterior/posterior stress  Sens DPN, SPN, TN intact  Motor EHL, ext, flex, evers 5/5  DP 1, PT 0, No significant edema  Skin:    General: Skin is warm and dry.  Neurological:     Mental Status: She is alert.  Psychiatric:        Mood and Affect: Mood normal.        Behavior: Behavior normal.     Assessment/Plan: Left hip fx -- Will need THA, surgeon and timing yet TBD. Will give diet today. Multiple medical problems including Raynaud's disease, CKD stage IIIb, DDD, GERD, osteoarthritis, hypertension, and peptic ulcer disease -- per primary service    Freeman Caldron, PA-C Orthopedic Surgery [phone] 12/25/2022, 9:15 AM

## 2022-12-26 ENCOUNTER — Encounter (HOSPITAL_COMMUNITY)
Admission: EM | Disposition: A | Discharge status: Skilled Nursing Facility | Payer: Self-pay | Source: Home / Self Care | Attending: Internal Medicine

## 2022-12-26 ENCOUNTER — Other Ambulatory Visit: Payer: Self-pay

## 2022-12-26 ENCOUNTER — Inpatient Hospital Stay (HOSPITAL_COMMUNITY): Payer: PPO

## 2022-12-26 ENCOUNTER — Inpatient Hospital Stay (HOSPITAL_COMMUNITY): Payer: PPO | Admitting: Anesthesiology

## 2022-12-26 ENCOUNTER — Encounter (HOSPITAL_COMMUNITY): Payer: Self-pay | Admitting: Internal Medicine

## 2022-12-26 DIAGNOSIS — S72042A Displaced fracture of base of neck of left femur, initial encounter for closed fracture: Secondary | ICD-10-CM

## 2022-12-26 DIAGNOSIS — S72002A Fracture of unspecified part of neck of left femur, initial encounter for closed fracture: Secondary | ICD-10-CM | POA: Diagnosis not present

## 2022-12-26 HISTORY — PX: TOTAL HIP ARTHROPLASTY: SHX124

## 2022-12-26 LAB — CBC WITH DIFFERENTIAL/PLATELET
Abs Immature Granulocytes: 0.09 10*3/uL — ABNORMAL HIGH (ref 0.00–0.07)
Basophils Absolute: 0 10*3/uL (ref 0.0–0.1)
Basophils Relative: 0 %
Eosinophils Absolute: 0 10*3/uL (ref 0.0–0.5)
Eosinophils Relative: 0 %
HCT: 32.7 % — ABNORMAL LOW (ref 36.0–46.0)
Hemoglobin: 10.1 g/dL — ABNORMAL LOW (ref 12.0–15.0)
Immature Granulocytes: 1 %
Lymphocytes Relative: 7 %
Lymphs Abs: 1 10*3/uL (ref 0.7–4.0)
MCH: 27.6 pg (ref 26.0–34.0)
MCHC: 30.9 g/dL (ref 30.0–36.0)
MCV: 89.3 fL (ref 80.0–100.0)
Monocytes Absolute: 0.5 10*3/uL (ref 0.1–1.0)
Monocytes Relative: 4 %
Neutro Abs: 11.7 10*3/uL — ABNORMAL HIGH (ref 1.7–7.7)
Neutrophils Relative %: 88 %
Platelets: 174 10*3/uL (ref 150–400)
RBC: 3.66 MIL/uL — ABNORMAL LOW (ref 3.87–5.11)
RDW: 14.3 % (ref 11.5–15.5)
WBC: 13.3 10*3/uL — ABNORMAL HIGH (ref 4.0–10.5)
nRBC: 0 % (ref 0.0–0.2)

## 2022-12-26 LAB — BASIC METABOLIC PANEL
Anion gap: 8 (ref 5–15)
BUN: 25 mg/dL — ABNORMAL HIGH (ref 8–23)
CO2: 18 mmol/L — ABNORMAL LOW (ref 22–32)
Calcium: 8.7 mg/dL — ABNORMAL LOW (ref 8.9–10.3)
Chloride: 109 mmol/L (ref 98–111)
Creatinine, Ser: 1.47 mg/dL — ABNORMAL HIGH (ref 0.44–1.00)
GFR, Estimated: 36 mL/min — ABNORMAL LOW (ref >60)
Glucose, Bld: 114 mg/dL — ABNORMAL HIGH (ref 70–99)
Potassium: 4.8 mmol/L (ref 3.5–5.1)
Sodium: 135 mmol/L (ref 135–145)

## 2022-12-26 SURGERY — ARTHROPLASTY, HIP, TOTAL, ANTERIOR APPROACH
Anesthesia: General | Site: Hip | Laterality: Left

## 2022-12-26 MED ORDER — DEXAMETHASONE SODIUM PHOSPHATE 10 MG/ML IJ SOLN
INTRAMUSCULAR | Status: DC | PRN
  Administered 2022-12-26: 4 mg via INTRAVENOUS

## 2022-12-26 MED ORDER — PHENOL 1.4 % MT LIQD: 1.0000 | OROMUCOSAL | Status: DC | PRN

## 2022-12-26 MED ORDER — ONDANSETRON HCL 4 MG/2ML IJ SOLN
INTRAMUSCULAR | Status: AC
  Filled 2022-12-26: qty 2

## 2022-12-26 MED ORDER — PROPOFOL 10 MG/ML IV BOLUS
INTRAVENOUS | Status: DC | PRN
  Administered 2022-12-26: 50 mg via INTRAVENOUS
  Administered 2022-12-26: 40 mg via INTRAVENOUS

## 2022-12-26 MED ORDER — CHLORHEXIDINE GLUCONATE 0.12 % MT SOLN
15.0000 mL | Freq: Once | OROMUCOSAL | Status: DC
Start: 2022-12-26 — End: 2022-12-26

## 2022-12-26 MED ORDER — MENTHOL 3 MG MT LOZG: 1.0000 | LOZENGE | OROMUCOSAL | Status: DC | PRN

## 2022-12-26 MED ORDER — METOCLOPRAMIDE HCL 5 MG/ML IJ SOLN: 5.0000 mg | Freq: Three times a day (TID) | INTRAMUSCULAR | Status: DC | PRN

## 2022-12-26 MED ORDER — ORAL CARE MOUTH RINSE
15.0000 mL | Freq: Once | OROMUCOSAL | Status: DC
Start: 2022-12-26 — End: 2022-12-26

## 2022-12-26 MED ORDER — ALPRAZOLAM 0.25 MG PO TABS
0.2500 mg | ORAL_TABLET | Freq: Once | ORAL | Status: AC
  Administered 2022-12-26: 0.25 mg via ORAL
  Filled 2022-12-26: qty 1

## 2022-12-26 MED ORDER — CEFAZOLIN SODIUM-DEXTROSE 2-4 GM/100ML-% IV SOLN
2.0000 g | Freq: Four times a day (QID) | INTRAVENOUS | Status: AC
  Administered 2022-12-27 (×2): 2 g via INTRAVENOUS
  Filled 2022-12-26 (×2): qty 100

## 2022-12-26 MED ORDER — 0.9 % SODIUM CHLORIDE (POUR BTL) OPTIME
TOPICAL | Status: DC | PRN
  Administered 2022-12-26: 1000 mL

## 2022-12-26 MED ORDER — LIDOCAINE 2% (20 MG/ML) 5 ML SYRINGE
INTRAMUSCULAR | Status: DC | PRN
  Administered 2022-12-26: 40 mg via INTRAVENOUS

## 2022-12-26 MED ORDER — SODIUM CHLORIDE 0.9 % IR SOLN
Status: DC | PRN
  Administered 2022-12-26: 1000 mL

## 2022-12-26 MED ORDER — PHENYLEPHRINE 80 MCG/ML (10ML) SYRINGE FOR IV PUSH (FOR BLOOD PRESSURE SUPPORT): PREFILLED_SYRINGE | INTRAVENOUS | Status: DC | PRN

## 2022-12-26 MED ORDER — FENTANYL CITRATE (PF) 250 MCG/5ML IJ SOLN
INTRAMUSCULAR | Status: DC | PRN
  Administered 2022-12-26 (×3): 12.5 ug via INTRAVENOUS
  Administered 2022-12-26 (×2): 25 ug via INTRAVENOUS
  Administered 2022-12-26 (×3): 12.5 ug via INTRAVENOUS

## 2022-12-26 MED ORDER — FENTANYL CITRATE (PF) 100 MCG/2ML IJ SOLN
INTRAMUSCULAR | Status: AC
  Filled 2022-12-26: qty 2

## 2022-12-26 MED ORDER — ALBUTEROL SULFATE HFA 108 (90 BASE) MCG/ACT IN AERS
INHALATION_SPRAY | RESPIRATORY_TRACT | Status: AC
  Filled 2022-12-26: qty 6.7

## 2022-12-26 MED ORDER — CEFAZOLIN SODIUM-DEXTROSE 2-4 GM/100ML-% IV SOLN
2.0000 g | INTRAVENOUS | Status: AC
  Administered 2022-12-26: 2 g via INTRAVENOUS
  Filled 2022-12-26: qty 100

## 2022-12-26 MED ORDER — FENTANYL CITRATE (PF) 100 MCG/2ML IJ SOLN
25.0000 ug | INTRAMUSCULAR | Status: DC | PRN
  Administered 2022-12-26: 50 ug via INTRAVENOUS

## 2022-12-26 MED ORDER — ALBUTEROL SULFATE HFA 108 (90 BASE) MCG/ACT IN AERS
INHALATION_SPRAY | RESPIRATORY_TRACT | Status: DC | PRN
  Administered 2022-12-26: 2 via RESPIRATORY_TRACT

## 2022-12-26 MED ORDER — CHLORHEXIDINE GLUCONATE 0.12 % MT SOLN: 15.0000 mL | Freq: Once | OROMUCOSAL | Status: AC

## 2022-12-26 MED ORDER — DOCUSATE SODIUM 100 MG PO CAPS
100.0000 mg | ORAL_CAPSULE | Freq: Two times a day (BID) | ORAL | Status: DC
  Administered 2022-12-27: 100 mg via ORAL
  Filled 2022-12-26 (×3): qty 1

## 2022-12-26 MED ORDER — PHENYLEPHRINE HCL-NACL 20-0.9 MG/250ML-% IV SOLN
INTRAVENOUS | Status: DC | PRN
  Administered 2022-12-26: 5 ug/min via INTRAVENOUS

## 2022-12-26 MED ORDER — FENTANYL CITRATE (PF) 250 MCG/5ML IJ SOLN
INTRAMUSCULAR | Status: AC
  Filled 2022-12-26: qty 5

## 2022-12-26 MED ORDER — PROPOFOL 10 MG/ML IV BOLUS
INTRAVENOUS | Status: AC
  Filled 2022-12-26: qty 20

## 2022-12-26 MED ORDER — PHENYLEPHRINE 80 MCG/ML (10ML) SYRINGE FOR IV PUSH (FOR BLOOD PRESSURE SUPPORT)
PREFILLED_SYRINGE | INTRAVENOUS | Status: AC
  Filled 2022-12-26: qty 10

## 2022-12-26 MED ORDER — CHLORHEXIDINE GLUCONATE 4 % EX SOLN: 60.0000 mL | Freq: Once | CUTANEOUS | Status: DC

## 2022-12-26 MED ORDER — ROCURONIUM BROMIDE 10 MG/ML (PF) SYRINGE
PREFILLED_SYRINGE | INTRAVENOUS | Status: DC | PRN
  Administered 2022-12-26: 50 mg via INTRAVENOUS

## 2022-12-26 MED ORDER — SODIUM CHLORIDE 0.9 % IV SOLN: INTRAVENOUS | Status: DC

## 2022-12-26 MED ORDER — LACTATED RINGERS IV SOLN: INTRAVENOUS | Status: DC

## 2022-12-26 MED ORDER — ORAL CARE MOUTH RINSE: 15.0000 mL | Freq: Once | OROMUCOSAL | Status: AC

## 2022-12-26 MED ORDER — METOCLOPRAMIDE HCL 5 MG PO TABS
5.0000 mg | ORAL_TABLET | Freq: Three times a day (TID) | ORAL | Status: DC | PRN
Start: 2022-12-26 — End: 2022-12-30

## 2022-12-26 MED ORDER — EPHEDRINE 5 MG/ML INJ
INTRAVENOUS | Status: AC
  Filled 2022-12-26: qty 5

## 2022-12-26 MED ORDER — ONDANSETRON HCL 4 MG/2ML IJ SOLN
INTRAMUSCULAR | Status: DC | PRN
  Administered 2022-12-26: 4 mg via INTRAVENOUS

## 2022-12-26 MED ORDER — SUGAMMADEX SODIUM 200 MG/2ML IV SOLN
INTRAVENOUS | Status: DC | PRN
  Administered 2022-12-26: 150 mg via INTRAVENOUS

## 2022-12-26 MED ORDER — STERILE WATER FOR IRRIGATION IR SOLN
Status: DC | PRN
Start: 2022-12-26 — End: 2022-12-26
  Administered 2022-12-26: 1000 mL

## 2022-12-26 MED ORDER — GLYCOPYRROLATE PF 0.2 MG/ML IJ SOSY
PREFILLED_SYRINGE | INTRAMUSCULAR | Status: AC
  Filled 2022-12-26: qty 1

## 2022-12-26 MED ORDER — CHLORHEXIDINE GLUCONATE 0.12 % MT SOLN
OROMUCOSAL | Status: AC
  Administered 2022-12-26: 15 mL via OROMUCOSAL
  Filled 2022-12-26: qty 15

## 2022-12-26 MED ORDER — POVIDONE-IODINE 10 % EX SWAB
2.0000 | Freq: Once | CUTANEOUS | Status: AC
  Administered 2022-12-26: 2 via TOPICAL

## 2022-12-26 MED ORDER — TRANEXAMIC ACID-NACL 1000-0.7 MG/100ML-% IV SOLN
1000.0000 mg | INTRAVENOUS | Status: AC
  Administered 2022-12-26: 1000 mg via INTRAVENOUS
  Filled 2022-12-26: qty 100

## 2022-12-26 MED ORDER — FENTANYL CITRATE PF 50 MCG/ML IJ SOSY: 50.0000 ug | PREFILLED_SYRINGE | Freq: Once | INTRAMUSCULAR | Status: DC

## 2022-12-26 SURGICAL SUPPLY — 47 items
BAG COUNTER SPONGE SURGICOUNT (BAG) ×1 IMPLANT
BENZOIN TINCTURE PRP APPL 2/3 (GAUZE/BANDAGES/DRESSINGS) ×1 IMPLANT
BLADE CLIPPER SURG (BLADE) IMPLANT
BLADE SAW SGTL 13.0X1.19X90.0M (BLADE) IMPLANT
BLADE SAW SGTL 18X1.27X75 (BLADE) ×1 IMPLANT
COVER SURGICAL LIGHT HANDLE (MISCELLANEOUS) ×1 IMPLANT
CUP SECTOR GRIPTON 50MM (Cup) IMPLANT
DRAPE C-ARM 42X72 X-RAY (DRAPES) ×1 IMPLANT
DRAPE STERI IOBAN 125X83 (DRAPES) ×1 IMPLANT
DRAPE U-SHAPE 47X51 STRL (DRAPES) ×3 IMPLANT
DRSG AQUACEL AG ADV 3.5X10 (GAUZE/BANDAGES/DRESSINGS) ×1 IMPLANT
DURAPREP 26ML APPLICATOR (WOUND CARE) ×1 IMPLANT
ELECT BLADE 4.0 EZ CLEAN MEGAD (MISCELLANEOUS) ×1
ELECT BLADE 6.5 EXT (BLADE) IMPLANT
ELECT REM PT RETURN 9FT ADLT (ELECTROSURGICAL) ×1
ELECTRODE BLDE 4.0 EZ CLN MEGD (MISCELLANEOUS) ×1 IMPLANT
ELECTRODE REM PT RTRN 9FT ADLT (ELECTROSURGICAL) ×1 IMPLANT
FACESHIELD WRAPAROUND (MASK) ×2 IMPLANT
FACESHIELD WRAPAROUND OR TEAM (MASK) ×2 IMPLANT
GLOVE BIOGEL PI IND STRL 8 (GLOVE) ×2 IMPLANT
GLOVE ECLIPSE 8.0 STRL XLNG CF (GLOVE) ×1 IMPLANT
GLOVE ORTHO TXT STRL SZ7.5 (GLOVE) ×2 IMPLANT
GOWN STRL REUS W/ TWL LRG LVL3 (GOWN DISPOSABLE) ×2 IMPLANT
GOWN STRL REUS W/ TWL XL LVL3 (GOWN DISPOSABLE) ×2 IMPLANT
HEAD FEM STD 32X+1 STRL (Hips) IMPLANT
KIT BASIN OR (CUSTOM PROCEDURE TRAY) ×1 IMPLANT
KIT TURNOVER KIT B (KITS) ×1 IMPLANT
LINER ACET PNNCL PLUS4 NEUTRAL (Hips) IMPLANT
MANIFOLD NEPTUNE II (INSTRUMENTS) ×1 IMPLANT
NS IRRIG 1000ML POUR BTL (IV SOLUTION) ×1 IMPLANT
PACK TOTAL JOINT (CUSTOM PROCEDURE TRAY) ×1 IMPLANT
PAD ARMBOARD 7.5X6 YLW CONV (MISCELLANEOUS) ×1 IMPLANT
PINNACLE PLUS 4 NEUTRAL (Hips) ×1 IMPLANT
SET HNDPC FAN SPRY TIP SCT (DISPOSABLE) ×1 IMPLANT
STAPLER VISISTAT 35W (STAPLE) IMPLANT
STEM FEM SZ3 STD ACTIS (Stem) IMPLANT
STRIP CLOSURE SKIN 1/2X4 (GAUZE/BANDAGES/DRESSINGS) ×2 IMPLANT
SUT ETHIBOND NAB CT1 #1 30IN (SUTURE) ×1 IMPLANT
SUT MNCRL AB 4-0 PS2 18 (SUTURE) IMPLANT
SUT VIC AB 0 CT1 27XBRD ANBCTR (SUTURE) ×1 IMPLANT
SUT VIC AB 1 CT1 27XBRD ANBCTR (SUTURE) ×1 IMPLANT
SUT VIC AB 2-0 CT1 TAPERPNT 27 (SUTURE) ×1 IMPLANT
TOWEL GREEN STERILE (TOWEL DISPOSABLE) ×1 IMPLANT
TOWEL GREEN STERILE FF (TOWEL DISPOSABLE) ×1 IMPLANT
TRAY CATH INTERMITTENT SS 16FR (CATHETERS) IMPLANT
TRAY FOLEY W/BAG SLVR 16FR ST (SET/KITS/TRAYS/PACK) IMPLANT
WATER STERILE IRR 1000ML POUR (IV SOLUTION) ×2 IMPLANT

## 2022-12-26 NOTE — Progress Notes (Signed)
Presents after mechanical fall. Suffered a closed displaced left femoral neck fracture.   12/26/22 1041  TOC Brief Assessment  Insurance and Status Reviewed  Patient has primary care physician Yes  Home environment has been reviewed From home alone. Supportive son ( lives in [place])  Prior level of function: PTA independent with ADL's. Uses a cane with ambulation.  Prior/Current Home Services No current home services  Social Drivers of Health Review SDOH reviewed no interventions necessary  Readmission risk has been reviewed No  Transition of care needs transition of care needs identified, TOC will continue to follow   Plan :Left hip fx - THA pending ... Pt agreeable to SNF placement for rehab if needed after surgery. TOC team following and will assist with needs post surgery. Gae Gallop RN,BSN,CM [phone]

## 2022-12-26 NOTE — Progress Notes (Signed)
[facility] Pre-op Review  Pre-op checklist: To be completed by bedside RN  NPO: Confirmed with Inetta Fermo, LPN sips with meds  Labs: PCR Neg, T&S done  Consent: Ordered  H&P: Hospitalist 12/18  Vitals: WNL  O2 requirements: RA  MAR/PTA review: No BB, No GLP, No Anticoag  IV: 18g and 22g  Floor nurse name:  Len Childs, LPN  Additional info:

## 2022-12-26 NOTE — Anesthesia Preprocedure Evaluation (Addendum)
Anesthesia Evaluation  Patient identified by MRN, date of birth, ID band Patient awake    Reviewed: Allergy & Precautions, NPO status , Patient's Chart, lab work & pertinent test results  History of Anesthesia Complications Negative for: history of anesthetic complications  Airway Mallampati: I  TM Distance: >3 FB Neck ROM: Full    Dental  (+) Edentulous Upper, Edentulous Lower   Pulmonary former smoker   breath sounds clear to auscultation       Cardiovascular hypertension, Pt. on medications (-) angina  Rhythm:Regular Rate:Normal  2016 ECHO:  - Left ventricle: The cavity size was normal. There was mild focal    basal hypertrophy of the septum. Systolic function was normal.    EF 60% to 65%.    Wall motion was normal; there were no regional wall motion    abnormalities. Doppler parameters are consistent with abnormal    left ventricular relaxation (grade 1 diastolic dysfunction).  - Mitral valve: There was mild regurgitation.      Neuro/Psych  Headaches  Anxiety     Chronic back pain: narcotics    GI/Hepatic Neg liver ROS,GERD  Controlled and Medicated,,  Endo/Other    Renal/GU Renal InsufficiencyRenal disease     Musculoskeletal  (+) Arthritis , steroids,    Abdominal   Peds  Hematology  (+) Blood dyscrasia (Hb 10.1, plt 174k), anemia   Anesthesia Other Findings   Reproductive/Obstetrics                             Anesthesia Physical Anesthesia Plan  ASA: 2  Anesthesia Plan: General   Post-op Pain Management: Ofirmev IV (intra-op)*   Induction: Intravenous  PONV Risk Score and Plan: 3 and Ondansetron, Dexamethasone and Treatment may vary due to age or medical condition  Airway Management Planned: Oral ETT  Additional Equipment: None  Intra-op Plan:   Post-operative Plan: Extubation in OR  Informed Consent: I have reviewed the patients History and Physical, chart,  labs and discussed the procedure including the risks, benefits and alternatives for the proposed anesthesia with the patient or authorized representative who has indicated his/her understanding and acceptance.     Dental advisory given  Plan Discussed with: CRNA and Surgeon  Anesthesia Plan Comments:        Anesthesia Quick Evaluation

## 2022-12-26 NOTE — Op Note (Signed)
Operative Note  Date of operation: 12/26/2022 Preoperative diagnosis: Left hip displaced and impacted femoral neck fracture Postoperative diagnosis: Same  Procedure: Left direct anterior total hip arthroplasty  Implants: Implant Name Type Inv. Item Serial No. Manufacturer Lot No. LRB No. Used Action  CUP SECTOR GRIPTON - [id number] Cup CUP SECTOR GRIPTON  [employer] [id number] Left 1 Implanted  PINNACLE PLUS 4 NEUTRAL - [id number] Hips PINNACLE PLUS 4 NEUTRAL  DEPUY ORTHOPA[employer] [id number] Left 1 Implanted  STEM FEM SZ3 STD ACTIS - [id number] Stem STEM FEM SZ3 STD ACTIS  DEPUY ORTH[employer] [id number] Left 1 Implanted  HEAD FEM STD 32X+1 STRL - [id number] Hips HEAD FEM STD 32X+1 STRL  DEPU[employer]TH[employer] [id number] Left 1 Implanted   Surgeon: Vanita Panda. Magnus Ivan, MD Assistant: Rexene Edison, PA-C  Anesthesia: General Antibiotics: 2 g IV Ancef EBL: 100 cc Complications: None  Indications: The patient is a 77 year old female who lives alone but does have good family support.  She does ambulate regular with a cane.  She sustained a mechanical fall Tuesday evening late and had severe left hip pain after that.  She got herself into bed and then the next morning was unable to ambulate.  She was brought to the [facility] emergency room on Wednesday morning and found to have a left hip fracture.  What was interesting is the CT scan shows evidence of potentially femoral head fracture on top of the femoral neck fracture.  She does have severe pain with any attempts to ambulate.  We have recommended a total hip arthroplasty to treat her left hip fracture.  I discussed this with her and her son in length in detail.  We described the risks of acute blood loss anemia, nerve or vessel injury, fracture, infection, DVT, dislocation, implant failure, leg length differences and wound healing issues.  They understand that our goals are hopefully decreased pain, improved mobility and improved quality of  life.  We feel the surgery is appropriate given her high level of function combined with the fracture that she has sustained.  Procedure description: After informed consent was obtained and the appropriate left hip was marked, the patient was brought to the operating room and general anesthesia was obtained which she was on the stretcher.  Traction boots were placed on both her feet and she was placed supine on the Hana fracture table with a perineal post in place in both legs and inline skeletal traction devices but no traction applied.  Her left operative hip and pelvis were assessed radiographically.  The left hip was prepped and draped in DuraPrep and sterile drapes.  A timeout was called and she was identified as the correct patient the correct left hip.  An incision was then made just inferior and posterior to the ASIS and carried slightly obliquely down the leg.  Dissection was carried down to the tensor fascia lata muscle and the tensor fascia was divided longitudinally to proceed with a direct anterior approach to the hip.  Circumflex vessels were identified and cauterized.  The hip capsule was identified and opened up in L-type format finding a large hemarthrosis.  There was definitely femoral neck fracture.  We did make a femoral neck cut just distal to her fracture and proximal to the lesser trochanter.  This was made with an oscillating saw and completed with an osteotome.  A corkscrew guide was placed in the femoral head and the femoral head was removed its entirety.  There was an area of fracture of the  femoral head and it may be consistent with AVN.  We then placed a bent Hohmann over the medial acetabular rim and remnants of the sterile labral rather debris removed.  We then began reaming under direct visualization from a size 43 reamer and stepwise increments going up to a size 49 reamer with all reamers placed under direct visualization and the last reamer placed under direct fluoroscopy in  order to obtain the depth of reaming, the inclination and the anteversion.  The real DePuy sector GRIPTION acetabular component size 50 was then placed without difficulty followed by a 32+4 polythene liner.  Attention was then turned to the femur.  With the left leg externally rotated to 120 degrees, extended and adducted, a Mueller retractor was placed medially and a Hohmann retractor behind the greater trochanter.  The lateral joint capsule was released and a box cutting osteotome was used in her femoral canal.  Broaching was then initiated using the Actis broaching system from a size 0 going to a size 3.  With a size 3 in place we trialed a standard offset femoral neck and a 32+1 trial hip ball.  The right leg was brought over and up and with traction and internal rotation reduced in the pelvis.  We are pleased with radiographic assessment and assessed mechanically of stability.  We were pleased with leg length and offset.  We dislocated the hip and removed the trial components.  We then placed the real Actis femoral component with standard offset size 3 and the real 32+1 metal hip ball.  Again this was reduced in the acetabular pleased overall assessing this mechanically and radiographically.  We then irrigated the soft tissue with normal saline solution.  The joint capsule was closed with interrupted #1 Ethibond suture followed by #1 Vicryl to close the tensor fascia.  0 Vicryl was used to close the deep tissue and 2-0 Vicryl was used to close subcutaneous tissue.  The skin was closed with staples.  An Aquacel dressing was applied.  The patient was taken off the Hana table, awakened, extubated and taken to the recovery room in stable condition.  Rexene Edison, PA-C did assist during the entire case and beginning to end and his assistance was crucial and medically necessary for soft tissue management and retraction, helping guide implant placement and a layered closure of the wound.

## 2022-12-26 NOTE — Progress Notes (Signed)
[facility] PROGRESS NOTE    Progress Note  Holly Jimenez  BJY:[id number] DOB: August 30, 1945 DOA: 12/24/2022 PCP: Irena Reichmann, DO     Brief Narrative:   GABBRIELLA Jimenez is an 77 y.o. female past medical history of renal disease, chronic kidney disease stage IIIb, essential hypertension, peptic ulcer disease comes into the ED with sudden onset of left hip pain after mechanical fall was found to have a closed displaced left femoral neck fracture   Assessment/Plan:   Acute closed displaced fracture of left femoral neck (HCC): Urinary to mechanical fall. CT of the left hip obtained showed acute subcapital left femoral neck fracture. Orthopedic surgery was consulted to recommend total hip replacement. Further management per orthopedic surgery.  Leukocytosis: Likely reactive, she has remained afebrile. Continue monitor closely.  Peptic ulcer disease history of: Continue PPI.  Essential hypertension: Continue to hold Norvasc and Cozaar. Blood pressure stable continue to monitor intermittently.  Chronic kidney disease stage IIIb: Creatinine at baseline.  History of Raynaud's disease: Continue Plaquenil.  Anemia of chronic renal disease: Hemoglobin has remained relatively stable.    DVT prophylaxis: lovenox Family Communication:none Status is: Inpatient Remains inpatient appropriate because: Acute left hip fracture    Code Status:     Code Status Orders  (From admission, onward)           Start     Ordered   12/24/22 1910  Full code  Continuous       Question:  By:  Answer:  Consent: discussion documented in EHR   12/24/22 1913           Code Status History     Date Active Date Inactive Code Status Order ID Comments User Context   12/24/2022 1554 12/24/2022 1913 Full Code [id number]  Rodolph Bong, MD ED   03/08/2014 1806 03/09/2014 2145 Full Code [id number]  Coletta Memos, MD Inpatient      Advance Directive  Documentation    Flowsheet Row Most Recent Value  Type of Advance Directive Healthcare Power of Attorney  Pre-existing out of facility DNR order (yellow form or pink MOST form) --  "MOST" Form in Place? --         IV Access:   Peripheral IV   Procedures and diagnostic studies:   CT Hip Left Wo Contrast Result Date: 12/24/2022 CLINICAL DATA:  Hip trauma.  Fracture suspected.  Fall. EXAM: CT OF THE LEFT HIP WITHOUT CONTRAST TECHNIQUE: Multidetector CT imaging of the left hip was performed according to the standard protocol. Multiplanar CT image reconstructions were also generated. RADIATION DOSE REDUCTION: This exam was performed according to the departmental dose-optimization program which includes automated exposure control, adjustment of the mA and/or kV according to patient size and/or use of iterative reconstruction technique. COMPARISON:  Pelvis and left hip radiographs 12/24/2022, CT abdomen and pelvis 07/14/2016 FINDINGS: Bones/Joint/Cartilage There is a band of sclerosis throughout the superolateral aspect of the left femoral head. Broad region of sclerosis is also seen within the medial the posteromedial aspect of the left femoral head. There is an acute subcapital proximal left femoral neck fracture with mild impaction. Approximately 2 mm inferior and 3 mm anterior displacement of the distal fracture component with respect to the proximal fracture component. Mild anterior superior left acetabular degenerative osteophytosis. Moderate anterior superior left femoroacetabular joint space narrowing. Mild left sacroiliac subchondral sclerosis and vacuum phenomenon degenerative change. Ligaments Suboptimally assessed by CT. Muscles and Tendons Mild atrophy and fatty infiltration of the left gluteus minimus  muscle. Soft tissues Moderate distention of the partially visualized urinary bladder. There is increased density and stranding within the posterolateral left buttock subcutaneous fat, likely  from recent fall/soft tissue contusion. Multiple peripherally calcified granulomas are seen within the left buttock subcutaneous fat. These are similar to 07/14/2016 CT. IMPRESSION: 1. Acute subcapital proximal left femoral neck fracture with mild impaction and displacement. 2. Band of sclerosis throughout the superolateral aspect of the left femoral head. Broad region of sclerosis is also seen within the medial and posteromedial aspect of the left femoral head. This is again suspicious for avascular necrosis. No overlying cortical collapse. Electronically Signed   By: Neita Garnet M.D.   On: 12/24/2022 16:58   DG Hip Unilat W or Wo Pelvis 2-3 Views Left Result Date: 12/24/2022 CLINICAL DATA:  Left hip pain status post fall EXAM: DG HIP (WITH OR WITHOUT PELVIS) 2-3V LEFT COMPARISON:  CT abdomen pelvis with contrast FINDINGS: Severe degenerative changes of the lower lumbar spine and right hip. Atypical femoral head lucency may be due to fracture or atypical osteopenia, given appearance on prior CT. However, fracture can not be completely excluded. IMPRESSION: Lucency of the left femoral head may be due to atypical pattern of osteopenia based on appearance on prior CT from 07/14/2016. However, fracture can not be conclusively excluded. Further evaluation with noncontrast left hip CT is recommended. Electronically Signed   By: Acquanetta Belling M.D.   On: 12/24/2022 13:59   CT Head Wo Contrast Result Date: 12/24/2022 CLINICAL DATA:  Ataxia, acute, traumatic.  Fall. EXAM: CT HEAD WITHOUT CONTRAST TECHNIQUE: Contiguous axial images were obtained from the base of the skull through the vertex without intravenous contrast. RADIATION DOSE REDUCTION: This exam was performed according to the departmental dose-optimization program which includes automated exposure control, adjustment of the mA and/or kV according to patient size and/or use of iterative reconstruction technique. COMPARISON:  Head CT 10/19/2017 FINDINGS:  Brain: There is no evidence of an acute infarct, intracranial hemorrhage, mass, midline shift, or extra-axial fluid collection. Patchy to confluent hypodensities in the cerebral white matter have mildly progressed and are nonspecific but compatible with moderate to severe chronic small vessel ischemic disease. Mild cerebral atrophy is within normal limits for age. Vascular: Calcified atherosclerosis at the skull base. No hyperdense vessel. Skull: No acute fracture or suspicious osseous lesion. Sinuses/Orbits: Focal mucosal thickening or small retention cyst inferomedially in the left frontal sinus. Clear mastoid air cells. Bilateral cataract extraction. Other: None. IMPRESSION: 1. No evidence of acute intracranial abnormality. 2. Moderate to severe chronic small vessel ischemic disease. Electronically Signed   By: Sebastian Ache M.D.   On: 12/24/2022 13:22     Medical Consultants:   None.   Subjective:    Lashunta Belk Jimenez pain is controlled, she relates she feels like she wants to have a bowel movement.  Objective:    Vitals:   12/25/22 0941 12/25/22 0945 12/25/22 2046 12/26/22 0524  BP: (!) 118/99 (!) 102/52 131/64 (!) 111/53  Pulse: 64 64 65 (!) 58  Resp: 13 14 18 14   Temp: 98.5 F (36.9 C)  98 F (36.7 C) 98.1 F (36.7 C)  TempSrc:      SpO2: (!) 87% 94% 94% 99%  Weight:      Height:       SpO2: 99 % O2 Flow Rate (L/min): 2 L/min   Intake/Output Summary (Last 24 hours) at 12/26/2022 0810 Last data filed at 12/25/2022 1842 Gross per 24 hour  Intake 927.73 ml  Output --  Net 927.73 ml   Filed Weights   12/24/22 1202  Weight: 37.2 kg    Exam: General exam: In no acute distress. Respiratory system: Good air movement and clear to auscultation. Cardiovascular system: S1 & S2 heard, RRR. No JVD. Gastrointestinal system: Abdomen is nondistended, soft and nontender.  Extremities: No pedal edema. Skin: No rashes, lesions or ulcers Psychiatry: Judgement and insight  appear normal. Mood & affect appropriate.    Data Reviewed:    Labs: Basic Metabolic Panel: Recent Labs  Lab 12/24/22 1400 12/25/22 0600 12/26/22 0648  NA 135 134* 135  K 4.1 4.8 4.8  CL 105 106 109  CO2 19* 22 18*  GLUCOSE 103* 68* 114*  BUN 22 20 25*  CREATININE 1.49* 1.56* 1.47*  CALCIUM 9.8 9.2 8.7*  MG 2.1  --   --   PHOS 3.2  --   --    GFR Estimated Creatinine Clearance: 18.2 mL/min (A) (by C-G formula based on SCr of 1.47 mg/dL (H)). Liver Function Tests: Recent Labs  Lab 12/25/22 0600  ALBUMIN 3.0*   No results for input(s): "LIPASE", "AMYLASE" in the last 168 hours. No results for input(s): "AMMONIA" in the last 168 hours. Coagulation profile No results for input(s): "INR", "PROTIME" in the last 168 hours. COVID-19 Labs  Recent Labs    12/25/22 1103  FERRITIN 699*    Lab Results  Component Value Date   SARSCOV2NAA Not Detected 01/13/2019    CBC: Recent Labs  Lab 12/24/22 1400 12/25/22 0600 12/26/22 0648  WBC 10.2 11.3* 13.3*  NEUTROABS 8.5*  --  11.7*  HGB 11.2* 10.8* 10.1*  HCT 35.6* 34.3* 32.7*  MCV 88.1 88.6 89.3  PLT 211 183 174   Cardiac Enzymes: No results for input(s): "CKTOTAL", "CKMB", "CKMBINDEX", "TROPONINI" in the last 168 hours. BNP (last 3 results) No results for input(s): "PROBNP" in the last 8760 hours. CBG: No results for input(s): "GLUCAP" in the last 168 hours. D-Dimer: No results for input(s): "DDIMER" in the last 72 hours. Hgb A1c: No results for input(s): "HGBA1C" in the last 72 hours. Lipid Profile: No results for input(s): "CHOL", "HDL", "LDLCALC", "TRIG", "CHOLHDL", "LDLDIRECT" in the last 72 hours. Thyroid function studies: No results for input(s): "TSH", "T4TOTAL", "T3FREE", "THYROIDAB" in the last 72 hours.  Invalid input(s): "FREET3" Anemia work up: Recent Labs    12/25/22 1103  VITAMINB12 3,866*  FOLATE >40.0  FERRITIN 699*  TIBC 202*  IRON 33   Sepsis Labs: Recent Labs  Lab  12/24/22 1400 12/25/22 0600 12/26/22 0648  WBC 10.2 11.3* 13.3*   Microbiology Recent Results (from the past 240 hours)  Surgical pcr screen     Status: None   Collection Time: 12/25/22  5:00 AM   Specimen: Nasal Mucosa; Nasal Swab  Result Value Ref Range Status   MRSA, PCR NEGATIVE NEGATIVE Final   Staphylococcus aureus NEGATIVE NEGATIVE Final    Comment: (NOTE) The Xpert SA Assay (FDA approved for NASAL specimens in patients 65 years of age and older), is one component of a comprehensive surveillance program. It is not intended to diagnose infection nor to guide or monitor treatment. Performed at [facility], [address] [place], [place] [address]      Medications:    acetaminophen  1,000 mg Oral Q8H   acidophilus  1 capsule Oral Daily   cholecalciferol  1,000 Units Oral Daily   cyanocobalamin  1,000 mcg Oral Daily   feeding supplement (NEPRO CARB  STEADY)  237 mL Oral BID BM   ferrous gluconate  324 mg Oral BID   heparin  5,000 Units Subcutaneous Q8H   hydroxychloroquine  200 mg Oral BID   leflunomide  10 mg Oral Daily   loratadine  10 mg Oral Daily   multivitamin with minerals  1 tablet Oral Daily   oxybutynin  5 mg Oral BID   pantoprazole  40 mg Oral Daily   predniSONE  5 mg Oral Q breakfast   pregabalin  75 mg Oral Daily   pyridOXINE  100 mg Oral Daily   senna  1 tablet Oral BID   sodium chloride flush  3 mL Intravenous Q12H   Continuous Infusions:  sodium chloride 100 mL/hr at 12/25/22 1843      LOS: 1 day   Marinda Elk  [facility]  12/26/2022, 8:10 AM

## 2022-12-26 NOTE — Anesthesia Postprocedure Evaluation (Signed)
Anesthesia Post Note  Patient: Holly Jimenez  Procedure(s) Performed: TOTAL HIP ARTHROPLASTY ANTERIOR APPROACH (Left: Hip)     Patient location during evaluation: PACU Anesthesia Type: General Level of consciousness: awake and alert Pain management: pain level controlled Vital Signs Assessment: post-procedure vital signs reviewed and stable Respiratory status: spontaneous breathing, nonlabored ventilation, respiratory function stable and patient connected to nasal cannula oxygen Cardiovascular status: blood pressure returned to baseline and stable Postop Assessment: no apparent nausea or vomiting Anesthetic complications: no   No notable events documented.  Last Vitals:  Vitals:   12/26/22 2000 12/26/22 2015  BP: 117/77 (!) 134/108  Pulse: 80 76  Resp: 17 12  Temp:    SpO2: 100% 98%    Last Pain:  Vitals:   12/26/22 2015  TempSrc:   PainSc: 0-No pain    LLE Motor Response: Purposeful movement (12/26/22 2015) LLE Sensation: Full sensation (12/26/22 2015) RLE Motor Response: Purposeful movement (12/26/22 2015) RLE Sensation: Full sensation (12/26/22 2015)      Mariann Barter

## 2022-12-26 NOTE — Progress Notes (Signed)
Patient ID: Holly Jimenez, female   DOB: [birth year], 77 y.o.   MRN: [id number] I have seen and examined the patient and reviewed all of her imaging studies.  I also spoke with her son at the bedside.  She has a fracture of her left hip femoral head and neck.  This happened after mechanical fall Tuesday evening.  She does normally ambulate with a cane.  Since then her pain has been debilitating.  However she did have a nerve block yesterday which has helped somewhat.  That is starting to wear off.  Based on the clinical exam findings combined with the x-ray findings and talking with her about her mobility, we have recommended a total hip arthroplasty for left hip to treat this fracture.  I did discuss the risk and benefits of the surgery in detail.  We talked about what to expect from an intraoperative and postoperative standpoint.  All questions and concerns were addressed and answered.  The left operative hip has been marked and informed consent has been obtained.

## 2022-12-26 NOTE — Transfer of Care (Signed)
Immediate Anesthesia Transfer of Care Note  Patient: Holly Jimenez  Procedure(s) Performed: TOTAL HIP ARTHROPLASTY ANTERIOR APPROACH (Left: Hip)  Patient Location: PACU  Anesthesia Type:General  Level of Consciousness: awake, alert , and oriented  Airway & Oxygen Therapy: Patient Spontanous Breathing and Patient connected to nasal cannula oxygen  Post-op Assessment: Report given to RN, Post -op Vital signs reviewed and stable, and Patient moving all extremities  Post vital signs: Reviewed and stable  Last Vitals:  Vitals Value Taken Time  BP 114/67 12/26/22 1942  Temp    Pulse    Resp 18 12/26/22 1945  SpO2    Vitals shown include unfiled device data.  Last Pain:  Vitals:   12/26/22 1636  TempSrc:   PainSc: 0-No pain      Patients Stated Pain Goal: 2 (12/26/22 0130)  Complications: No notable events documented.

## 2022-12-26 NOTE — Plan of Care (Signed)
  Problem: Clinical Measurements: Goal: Will remain free from infection Outcome: Progressing   Problem: Activity: Goal: Risk for activity intolerance will decrease Outcome: Progressing   Problem: Coping: Goal: Level of anxiety will decrease Outcome: Progressing

## 2022-12-27 DIAGNOSIS — S72002A Fracture of unspecified part of neck of left femur, initial encounter for closed fracture: Secondary | ICD-10-CM | POA: Diagnosis not present

## 2022-12-27 MED ORDER — POLYETHYLENE GLYCOL 3350 17 G PO PACK: 17.0000 g | PACK | Freq: Two times a day (BID) | ORAL | Status: DC

## 2022-12-27 MED ORDER — OXYCODONE HCL 5 MG PO TABS: 5.0000 mg | ORAL_TABLET | ORAL | 0 refills | Status: DC | PRN

## 2022-12-27 NOTE — Progress Notes (Signed)
Patient ID: Holly Jimenez, female   DOB: September 12, 1945, 77 y.o.   MRN: [id number] The patient is awake and alert this morning and sitting up in bed eating breakfast.  She said she is comfortable following surgery yesterday evening.  We were able to perform a left direct anterior total hip arthroplasty.  Her left hip thus far appears stable.  The dressing is clean and dry.  She does live alone and will likely need short-term skilled nursing following this hospitalization and she understands that as well.  From an orthopedic standpoint, usually for DVT coverage we just have patients on a baby aspirin twice daily unless the patient was on previous blood thinning medications prior to surgery which she can resume those.  From a therapy standpoint, she can be up with assistance with full weightbearing as tolerated on her left hip and no hip precautions.  I did give her a printout of the x-ray of her left hip.

## 2022-12-27 NOTE — Evaluation (Signed)
Physical Therapy Evaluation  Patient Details Name: Holly Jimenez MRN: [id number] DOB: 09-20-1945 Today's Date: 12/27/2022  History of Present Illness  Pt is a 77 y/o female who presents 12/24/2022 S/p fall, sustaining a L hip displaced and impacted femoral neck fracture. She is now s/p L direct anterior total hip arthroplasty on 12/26/2022. PMH significant for anxiety, chronic back pain, CKD, DDD, HTN, Lupus, mixed connective tissue disease, neuropathy, Raynaud's disease, kyphoplasty, L foot surgery.   Clinical Impression  Pt admitted with above diagnosis. Pt currently with functional limitations due to the deficits listed below (see PT Problem List). At the time of PT eval pt was able to perform transfers and minimal ambulation with up to mod assist and RW for support. +2 assist initially fading to +1 by end of session. Recommend post-acute rehab to maximize functional independence, safety, and facilitate return home alone with intermittent family support. Acutely, pt will benefit from skilled PT to increase their independence and safety with mobility to allow discharge.           If plan is discharge home, recommend the following: A lot of help with walking and/or transfers;A lot of help with bathing/dressing/bathroom;Assistance with cooking/housework;Assist for transportation;Help with stairs or ramp for entrance   Can travel by private vehicle   Yes    Equipment Recommendations None recommended by PT  Recommendations for Other Services       Functional Status Assessment Patient has had a recent decline in their functional status and demonstrates the ability to make significant improvements in function in a reasonable and predictable amount of time.     Precautions / Restrictions Precautions Precautions: Fall Precaution Comments: Direct anterior approach, no hip precautions. Restrictions Weight Bearing Restrictions Per Provider Order: Yes LLE Weight Bearing Per  Provider Order: Weight bearing as tolerated      Mobility  Bed Mobility Overal bed mobility: Needs Assistance Bed Mobility: Supine to Sit     Supine to sit: Mod assist     General bed mobility comments: VC's for sequencing, advancing LLE and elevating trunk to full sitting position. Bed pad utilized for scooting assist.    Transfers Overall transfer level: Needs assistance Equipment used: Rolling walker (2 wheels) Transfers: Sit to/from Stand Sit to Stand: Mod assist, +2 physical assistance           General transfer comment: VC's for hand placement on seated surface for safety. Increased time and effort required to achieve full stand.    Ambulation/Gait Ambulation/Gait assistance: Mod assist, +2 physical assistance, +2 safety/equipment Gait Distance (Feet): 5 Feet Assistive device: Rolling walker (2 wheels) Gait Pattern/deviations: Step-to pattern, Decreased stride length, Trunk flexed, Narrow base of support, Decreased weight shift to left Gait velocity: Decreased Gait velocity interpretation: <1.31 ft/sec, indicative of household ambulator   General Gait Details: Initially, +2 required to facilitate weight shift and advance LLE. Once pt began taking steps, was able to continue with +1 assist, however second person needed to pull chair up behind pt after ~5 feet.  Stairs            Wheelchair Mobility     Tilt Bed    Modified Rankin (Stroke Patients Only)       Balance Overall balance assessment: Needs assistance Sitting-balance support: No upper extremity supported, Feet supported Sitting balance-Leahy Scale: Fair     Standing balance support: Bilateral upper extremity supported, During functional activity, Reliant on assistive device for balance Standing balance-Leahy Scale: Poor  Pertinent Vitals/Pain Pain Assessment Pain Assessment: 0-10 Pain Score: 6  Pain Location: L hip Pain Descriptors /  Indicators: Operative site guarding, Sore, Grimacing Pain Intervention(s): Limited activity within patient's tolerance, Monitored during session, Repositioned    Home Living Family/patient expects to be discharged to:: Private residence Living Arrangements: Alone   Type of Home: House Home Access: Stairs to enter Entrance Stairs-Rails: Left Entrance Stairs-Number of Steps: 4   Home Layout: One level Home Equipment: Cane - single Librarian, academic (2 wheels);Shower seat;Hand held shower head      Prior Function Prior Level of Function : Independent/Modified Independent             Mobility Comments: using a cane for mobility       Extremity/Trunk Assessment   Upper Extremity Assessment Upper Extremity Assessment: Defer to OT evaluation    Lower Extremity Assessment Lower Extremity Assessment: LLE deficits/detail LLE Deficits / Details: Acute pain, decreased strength and AROM consistent with above mentioned surgery. LLE: Unable to fully assess due to pain    Cervical / Trunk Assessment Cervical / Trunk Assessment: Other exceptions Cervical / Trunk Exceptions: Forward head posture with rounded shoulders  Communication   Communication Communication: No apparent difficulties  Cognition Arousal: Alert Behavior During Therapy: WFL for tasks assessed/performed Overall Cognitive Status: Within Functional Limits for tasks assessed                                          General Comments      Exercises General Exercises - Lower Extremity Ankle Circles/Pumps: 10 reps Long Arc Quad: 5 reps   Assessment/Plan    PT Assessment Patient needs continued PT services  PT Problem List Decreased strength;Decreased activity tolerance;Decreased mobility;Decreased balance;Decreased knowledge of use of DME;Decreased safety awareness;Decreased knowledge of precautions;Pain       PT Treatment Interventions DME instruction;Gait training;Stair  training;Functional mobility training;Therapeutic activities;Therapeutic exercise;Balance training;Patient/family education    PT Goals (Current goals can be found in the Care Plan section)  Acute Rehab PT Goals Patient Stated Goal: Be able to return to her home. PT Goal Formulation: With patient Time For Goal Achievement: 01/10/23 Potential to Achieve Goals: Good    Frequency Min 1X/week     Co-evaluation PT/OT/SLP Co-Evaluation/Treatment: Yes Reason for Co-Treatment: For patient/therapist safety;To address functional/ADL transfers PT goals addressed during session: Mobility/safety with mobility;Balance;Proper use of DME;Strengthening/ROM         AM-PAC PT "6 Clicks" Mobility  Outcome Measure Help needed turning from your back to your side while in a flat bed without using bedrails?: A Lot Help needed moving from lying on your back to sitting on the side of a flat bed without using bedrails?: A Lot Help needed moving to and from a bed to a chair (including a wheelchair)?: A Lot Help needed standing up from a chair using your arms (e.g., wheelchair or bedside chair)?: A Lot Help needed to walk in hospital room?: Total Help needed climbing 3-5 steps with a railing? : Total 6 Click Score: 10    End of Session Equipment Utilized During Treatment: Gait belt Activity Tolerance: Patient tolerated treatment well Patient left: in chair;with call bell/phone within reach;with chair alarm set Nurse Communication: Mobility status PT Visit Diagnosis: Unsteadiness on feet (R26.81);Pain Pain - Right/Left: Left Pain - part of body: Hip    Time: 6440-3474 PT Time Calculation (min) (ACUTE ONLY): 23 min  Charges:   PT Evaluation $PT Eval Moderate Complexity: 1 Mod   PT General Charges $$ ACUTE PT VISIT: 1 Visit         Conni Slipper, PT, DPT Acute Rehabilitation Services Secure Chat Preferred Office: [phone]   Holly Jimenez 12/27/2022, 4:31 PM

## 2022-12-27 NOTE — Progress Notes (Signed)
[facility] PROGRESS NOTE    Progress Note  Holly Jimenez  ZOX:[id number] DOB: 1945-11-13 DOA: 12/24/2022 PCP: Irena Reichmann, DO     Brief Narrative:   Holly Jimenez is an 77 y.o. female past medical history of renal disease, chronic kidney disease stage IIIb, essential hypertension, peptic ulcer disease comes into the ED with sudden onset of left hip pain after mechanical fall was found to have a closed displaced left femoral neck fracture   Assessment/Plan:   Acute closed displaced fracture of left femoral neck (HCC): Likely secondary to mechanical fall. CT of the left hip obtained showed acute subcapital left femoral neck fracture. Orthopedic surgery was consulted she status post left total hip arthroplasty Continue narcotics per orthopedic surgery. Will start her on a bowel regimen.  Leukocytosis: Likely reactive, she has remained afebrile. Continue monitor closely.  Peptic ulcer disease history of: Continue PPI.  Essential hypertension: Blood pressure remains in the 110s continue to hold Norvasc and Cozaar. Continue to monitor blood pressure closely.  Chronic kidney disease stage IIIb: Current has remained at baseline.  History of Raynaud's disease: Continue Plaquenil.  Anemia of chronic renal disease: Hemoglobin has remained relatively stable.    DVT prophylaxis: lovenox Family Communication:none Status is: Inpatient Remains inpatient appropriate because: Acute left hip fracture    Code Status:     Code Status Orders  (From admission, onward)           Start     Ordered   12/24/22 1910  Full code  Continuous       Question:  By:  Answer:  Consent: discussion documented in EHR   12/24/22 1913           Code Status History     Date Active Date Inactive Code Status Order ID Comments User Context   12/24/2022 1554 12/24/2022 1913 Full Code [id number]  Rodolph Bong, MD ED   03/08/2014 1806 03/09/2014 2145 Full  Code [id number]  Coletta Memos, MD Inpatient      Advance Directive Documentation    Flowsheet Row Most Recent Value  Type of Advance Directive Healthcare Power of Attorney  Pre-existing out of facility DNR order (yellow form or pink MOST form) --  "MOST" Form in Place? --         IV Access:   Peripheral IV   Procedures and diagnostic studies:   DG Pelvis Portable Result Date: 12/26/2022 CLINICAL DATA:  Postop EXAM: PORTABLE PELVIS 1-2 VIEWS COMPARISON:  12/24/2022 FINDINGS: Interval left hip replacement with intact hardware and normal alignment. Cutaneous staples and gas in the soft tissues consistent with recent surgery IMPRESSION: Status post left hip replacement with expected postsurgical change. Electronically Signed   By: Jasmine Pang M.D.   On: 12/26/2022 22:31   DG HIP UNILAT WITH PELVIS 1V LEFT Result Date: 12/26/2022 CLINICAL DATA:  Hip arthroplasty EXAM: DG HIP (WITH OR WITHOUT PELVIS) 1V*L* COMPARISON:  CT 12/24/2022 FINDINGS: Three low resolution intraoperative spot views of the left hip. Total fluoroscopy time was 24.4 seconds, fluoroscopic dose of 1.3 mGy. The images demonstrate left hip replacement with intact hardware and normal alignment. IMPRESSION: Intraoperative fluoroscopic assistance provided during left hip replacement. Electronically Signed   By: Jasmine Pang M.D.   On: 12/26/2022 22:30   DG C-Arm 1-60 Min-No Report Result Date: 12/26/2022 Fluoroscopy was utilized by the requesting physician.  No radiographic interpretation.     Medical Consultants:   None.   Subjective:    Holly Jimenez  pain is controlled, had a bowel movement, eager to work with physical therapy.  Objective:    Vitals:   12/26/22 2015 12/26/22 2045 12/26/22 2352 12/27/22 0426  BP: (!) 134/108 (!) 114/95 (!) 111/48 (!) 100/54  Pulse: 76 68 (!) 58 64  Resp: 12 16 16 20   Temp: 98 F (36.7 C) 98.7 F (37.1 C) 97.8 F (36.6 C) 97.8 F (36.6 C)  TempSrc:   Oral    SpO2: 98% 94% 94% 90%  Weight:      Height:       SpO2: 90 % O2 Flow Rate (L/min): 2 L/min   Intake/Output Summary (Last 24 hours) at 12/27/2022 0824 Last data filed at 12/26/2022 1932 Gross per 24 hour  Intake 1200 ml  Output 750 ml  Net 450 ml   Filed Weights   12/24/22 1202  Weight: 37.2 kg    Exam: General exam: In no acute distress. Respiratory system: Good air movement and clear to auscultation. Cardiovascular system: S1 & S2 heard, RRR. No JVD. Gastrointestinal system: Abdomen is nondistended, soft and nontender.  Extremities: No pedal edema. Skin: No rashes, lesions or ulcers Psychiatry: Judgement and insight appear normal. Mood & affect appropriate.  Data Reviewed:    Labs: Basic Metabolic Panel: Recent Labs  Lab 12/24/22 1400 12/25/22 0600 12/26/22 0648  NA 135 134* 135  K 4.1 4.8 4.8  CL 105 106 109  CO2 19* 22 18*  GLUCOSE 103* 68* 114*  BUN 22 20 25*  CREATININE 1.49* 1.56* 1.47*  CALCIUM 9.8 9.2 8.7*  MG 2.1  --   --   PHOS 3.2  --   --    GFR Estimated Creatinine Clearance: 18.2 mL/min (A) (by C-G formula based on SCr of 1.47 mg/dL (H)). Liver Function Tests: Recent Labs  Lab 12/25/22 0600  ALBUMIN 3.0*   No results for input(s): "LIPASE", "AMYLASE" in the last 168 hours. No results for input(s): "AMMONIA" in the last 168 hours. Coagulation profile No results for input(s): "INR", "PROTIME" in the last 168 hours. COVID-19 Labs  Recent Labs    12/25/22 1103  FERRITIN 699*    Lab Results  Component Value Date   SARSCOV2NAA Not Detected 01/13/2019    CBC: Recent Labs  Lab 12/24/22 1400 12/25/22 0600 12/26/22 0648  WBC 10.2 11.3* 13.3*  NEUTROABS 8.5*  --  11.7*  HGB 11.2* 10.8* 10.1*  HCT 35.6* 34.3* 32.7*  MCV 88.1 88.6 89.3  PLT 211 183 174   Cardiac Enzymes: No results for input(s): "CKTOTAL", "CKMB", "CKMBINDEX", "TROPONINI" in the last 168 hours. BNP (last 3 results) No results for input(s): "PROBNP"  in the last 8760 hours. CBG: No results for input(s): "GLUCAP" in the last 168 hours. D-Dimer: No results for input(s): "DDIMER" in the last 72 hours. Hgb A1c: No results for input(s): "HGBA1C" in the last 72 hours. Lipid Profile: No results for input(s): "CHOL", "HDL", "LDLCALC", "TRIG", "CHOLHDL", "LDLDIRECT" in the last 72 hours. Thyroid function studies: No results for input(s): "TSH", "T4TOTAL", "T3FREE", "THYROIDAB" in the last 72 hours.  Invalid input(s): "FREET3" Anemia work up: Recent Labs    12/25/22 1103  VITAMINB12 3,866*  FOLATE >40.0  FERRITIN 699*  TIBC 202*  IRON 33   Sepsis Labs: Recent Labs  Lab 12/24/22 1400 12/25/22 0600 12/26/22 0648  WBC 10.2 11.3* 13.3*   Microbiology Recent Results (from the past 240 hours)  Surgical pcr screen     Status: None   Collection Time: 12/25/22  5:00  AM   Specimen: Nasal Mucosa; Nasal Swab  Result Value Ref Range Status   MRSA, PCR NEGATIVE NEGATIVE Final   Staphylococcus aureus NEGATIVE NEGATIVE Final    Comment: (NOTE) The Xpert SA Assay (FDA approved for NASAL specimens in patients 11 years of age and older), is one component of a comprehensive surveillance program. It is not intended to diagnose infection nor to guide or monitor treatment. Performed at [facility], [address], [place], [place] [address]      Medications:    acetaminophen  1,000 mg Oral Q8H   acidophilus  1 capsule Oral Daily   cholecalciferol  1,000 Units Oral Daily   cyanocobalamin  1,000 mcg Oral Daily   docusate sodium  100 mg Oral BID   feeding supplement (NEPRO CARB STEADY)  237 mL Oral BID BM   ferrous gluconate  324 mg Oral BID   heparin  5,000 Units Subcutaneous Q8H   hydroxychloroquine  200 mg Oral BID   leflunomide  10 mg Oral Daily   loratadine  10 mg Oral Daily   multivitamin with minerals  1 tablet Oral Daily   oxybutynin  5 mg Oral BID   pantoprazole  40 mg Oral Daily   predniSONE  5 mg Oral Q breakfast    pregabalin  75 mg Oral Daily   pyridOXINE  100 mg Oral Daily   senna  1 tablet Oral BID   sodium chloride flush  3 mL Intravenous Q12H   Continuous Infusions:  sodium chloride 75 mL/hr at 12/26/22 2236      LOS: 2 days   Marinda Elk  [facility]  12/27/2022, 8:24 AM

## 2022-12-27 NOTE — Discharge Instructions (Signed)

## 2022-12-27 NOTE — Plan of Care (Signed)
  Problem: Activity: Goal: Risk for activity intolerance will decrease Outcome: Progressing   Problem: Nutrition: Goal: Adequate nutrition will be maintained Outcome: Progressing   Problem: Coping: Goal: Level of anxiety will decrease Outcome: Progressing   

## 2022-12-27 NOTE — Evaluation (Addendum)
Occupational Therapy Evaluation Patient Details Name: Holly Jimenez MRN: [id number] DOB: Mar 12, 1945 Today's Date: 12/27/2022   History of Present Illness Pt is a 77 y/o female who presents 12/24/2022 S/p fall, sustaining a L hip displaced and impacted femoral neck fracture. She is now s/p L direct anterior total hip arthroplasty on 12/26/2022. PMH significant for anxiety, chronic back pain, CKD, DDD, HTN, Lupus, mixed connective tissue disease, neuropathy, Raynaud's disease, kyphoplasty, L foot surgery.   Clinical Impression   PTA, pt was living alone and was independent; using cane for mobility. Pt currently requiring Min A for UB ADLs, Max A for LB ADLs, and Mod A +2 for functional mobility with RW. Pt presenting with decreased balance, strength, and activity tolerance. Despite pain, pt very motivated to participate in therapy and return to PLOF. Benefits from premedications for pain management. Pt would benefit from further acute OT to facilitate safe dc. Recommend dc to post-acute rehab (<3hr) for further OT to optimize safety, independence with ADLs, and return to PLOF.       If plan is discharge home, recommend the following: A lot of help with bathing/dressing/bathroom;A lot of help with walking and/or transfers;Assistance with cooking/housework;Assist for transportation    Functional Status Assessment  Patient has had a recent decline in their functional status and demonstrates the ability to make significant improvements in function in a reasonable and predictable amount of time.  Equipment Recommendations  Other (comment) (Defer to next venue)    Recommendations for Other Services       Precautions / Restrictions Precautions Precautions: Fall Precaution Comments: Direct anterior approach, no hip precautions. Restrictions Weight Bearing Restrictions Per Provider Order: Yes LLE Weight Bearing Per Provider Order: Weight bearing as tolerated      Mobility Bed  Mobility Overal bed mobility: Needs Assistance Bed Mobility: Supine to Sit     Supine to sit: Mod assist, HOB elevated, +2 for safety/equipment     General bed mobility comments: VC's for sequencing, advancing LLE and elevating trunk to full sitting position. Bed pad utilized for scooting assist.    Transfers Overall transfer level: Needs assistance Equipment used: Rolling walker (2 wheels) Transfers: Sit to/from Stand Sit to Stand: Mod assist, +2 physical assistance           General transfer comment: VC's for hand placement on seated surface for safety. Increased time and effort required to achieve full stand.      Balance Overall balance assessment: Needs assistance Sitting-balance support: No upper extremity supported, Feet supported Sitting balance-Leahy Scale: Fair     Standing balance support: Bilateral upper extremity supported, During functional activity, Reliant on assistive device for balance Standing balance-Leahy Scale: Poor                             ADL either performed or assessed with clinical judgement   ADL Overall ADL's : Needs assistance/impaired Eating/Feeding: Set up;Sitting   Grooming: Set up;Sitting   Upper Body Bathing: Minimal assistance;Sitting   Lower Body Bathing: Maximal assistance;Sit to/from stand   Upper Body Dressing : Minimal assistance;Sitting   Lower Body Dressing: Maximal assistance;Sit to/from stand   Toilet Transfer: Moderate assistance;+2 for physical assistance;Ambulation;Rolling walker (2 wheels) (simulated to recliner)           Functional mobility during ADLs: Moderate assistance;+2 for physical assistance;Rolling walker (2 wheels) General ADL Comments: Pt demonstrating decreased balance, strength, and activtiy tolernace.     Vision Baseline Vision/History:  1 Wears glasses       Perception         Praxis         Pertinent Vitals/Pain Pain Assessment Pain Assessment: 0-10 Pain Score: 6   Pain Location: L hip Pain Descriptors / Indicators: Operative site guarding, Sore, Grimacing Pain Intervention(s): Monitored during session, Repositioned, Premedicated before session, Limited activity within patient's tolerance     Extremity/Trunk Assessment Upper Extremity Assessment Upper Extremity Assessment: Overall WFL for tasks assessed   Lower Extremity Assessment Lower Extremity Assessment: Defer to PT evaluation LLE Deficits / Details: Acute pain, decreased strength and AROM consistent with above mentioned surgery. LLE: Unable to fully assess due to pain   Cervical / Trunk Assessment Cervical / Trunk Assessment: Other exceptions Cervical / Trunk Exceptions: Forward head posture with rounded shoulders   Communication Communication Communication: No apparent difficulties   Cognition Arousal: Alert Behavior During Therapy: WFL for tasks assessed/performed Overall Cognitive Status: Within Functional Limits for tasks assessed                                       General Comments  VSS    Exercises     Shoulder Instructions      Home Living Family/patient expects to be discharged to:: Private residence Living Arrangements: Alone Available Help at Discharge: Family;Available PRN/intermittently Type of Home: House Home Access: Stairs to enter [employer] of Steps: 4 Entrance Stairs-Rails: Left Home Layout: One level     Bathroom Shower/Tub: Chief Strategy Officer: Standard     Home Equipment: Cane - single Librarian, academic (2 wheels);Shower seat;Hand held shower head          Prior Functioning/Environment Prior Level of Function : Independent/Modified Independent             Mobility Comments: using a cane for mobility ADLs Comments: Has a person come assist with home management and taking her on errands. Pt still performs ADls, light IADLs, and driving.        OT Problem List: Decreased  strength;Decreased range of motion;Decreased activity tolerance;Impaired balance (sitting and/or standing);Decreased knowledge of use of DME or AE;Decreased knowledge of precautions;Pain      OT Treatment/Interventions: Therapeutic exercise;Self-care/ADL training;Energy conservation;DME and/or AE instruction;Therapeutic activities;Patient/family education    OT Goals(Current goals can be found in the care plan section) Acute Rehab OT Goals Patient Stated Goal: Get stronger and return home OT Goal Formulation: With patient Time For Goal Achievement: 01/10/23 Potential to Achieve Goals: Good ADL Goals Pt Will Perform Upper Body Dressing: with set-up;with supervision;sitting Pt Will Perform Lower Body Dressing: with contact guard assist;with adaptive equipment;sit to/from stand Pt Will Transfer to Toilet: with contact guard assist;bedside commode;ambulating Pt Will Perform Toileting - Clothing Manipulation and hygiene: with contact guard assist;sit to/from stand;sitting/lateral leans Additional ADL Goal #1: Pt will perform bed mobility with Supervision in preparation for ADLs  OT Frequency: Min 1X/week    Co-evaluation PT/OT/SLP Co-Evaluation/Treatment: Yes Reason for Co-Treatment: For patient/therapist safety;To address functional/ADL transfers PT goals addressed during session: Mobility/safety with mobility;Balance;Proper use of DME;Strengthening/ROM OT goals addressed during session: ADL's and self-care      AM-PAC OT "6 Clicks" Daily Activity     Outcome Measure Help from another person eating meals?: None Help from another person taking care of personal grooming?: A Little Help from another person toileting, which includes using toliet, bedpan, or urinal?: A Lot  Help from another person bathing (including washing, rinsing, drying)?: A Lot Help from another person to put on and taking off regular upper body clothing?: A Little Help from another person to put on and taking off regular  lower body clothing?: A Lot 6 Click Score: 16   End of Session Equipment Utilized During Treatment: Gait belt;Rolling walker (2 wheels) Nurse Communication: Mobility status;Patient requests pain meds  Activity Tolerance: Patient tolerated treatment well Patient left: in chair;with call bell/phone within reach  OT Visit Diagnosis: Unsteadiness on feet (R26.81);Other abnormalities of gait and mobility (R26.89);Muscle weakness (generalized) (M62.81)                Time: 4098-1191 OT Time Calculation (min): 23 min Charges:  OT General Charges $OT Visit: 1 Visit OT Evaluation $OT Eval Moderate Complexity: 1 Mod  Kaymon Denomme MSOT, OTR/L Acute Rehab Office: [phone]  Theodoro Grist Heather Mckendree 12/27/2022, 5:02 PM

## 2022-12-28 DIAGNOSIS — S72002A Fracture of unspecified part of neck of left femur, initial encounter for closed fracture: Secondary | ICD-10-CM | POA: Diagnosis not present

## 2022-12-28 MED ORDER — ASPIRIN 81 MG PO TBEC
81.0000 mg | DELAYED_RELEASE_TABLET | Freq: Two times a day (BID) | ORAL | Status: DC
  Administered 2022-12-28 (×2): 81 mg via ORAL
  Filled 2022-12-28 (×5): qty 1

## 2022-12-28 MED ORDER — ALPRAZOLAM 0.25 MG PO TABS
0.2500 mg | ORAL_TABLET | Freq: Once | ORAL | Status: AC
  Administered 2022-12-28: 0.25 mg via ORAL
  Filled 2022-12-28: qty 1

## 2022-12-28 NOTE — [place] FL2 (Signed)
[place] MEDICAID FL2 LEVEL OF CARE FORM     IDENTIFICATION  Patient Name: Holly Jimenez Birthdate: Dec 19, 1945 Sex: female Admission Date (Current Location): 12/24/2022  [facility] and [place] Number:  Producer, television/film/video and Address:  [facility] [place], [address] [place], [place] [address]      Provider Number: [id number]  Attending Physician Name and Address:  Marinda Elk, MD  Relative Name and Phone Number:       Current Level of Care: Hospital Recommended Level of Care: Skilled Nursing Facility Prior Approval Number:    Date Approved/Denied:   PASRR Number: [id number]  Discharge Plan: SNF    Current Diagnoses: Patient Active Problem List   Diagnosis Date Noted   Closed displaced fracture of left femoral neck (HCC) 12/25/2022   Protein-calorie malnutrition, severe 12/25/2022   Acute pain of left hip 12/24/2022   CKD stage 3b, GFR 30-44 ml/min (HCC) 12/24/2022   Abnormal TSH 05/31/2019   Gait abnormality 05/30/2019   Paresthesia 05/30/2019   Low back pain without sciatica 05/30/2019   Subdural hematoma (HCC) 11/05/2016   Compression fracture of body of thoracic vertebra (HCC) 03/08/2014   Neuropathy 02/16/2014   Ulcer of the stomach and intestine 02/16/2014   Raynaud disease 02/16/2014   Abnormal ECG 02/16/2014   Essential hypertension 02/16/2014   Abnormal EKG 02/16/2014   NONSPECIFIC ABN FINDING RAD & OTH EXAM GI TRACT 08/22/2008    Orientation RESPIRATION BLADDER Height & Weight     Self, Time, Situation, Place  O2 ([place] 2L) Continent Weight: 82 lb (37.2 kg) Height:  5\' 1"  (154.9 cm)  BEHAVIORAL SYMPTOMS/MOOD NEUROLOGICAL BOWEL NUTRITION STATUS      Continent Diet (See DC summary)  AMBULATORY STATUS COMMUNICATION OF NEEDS Skin   Extensive Assist Verbally Surgical wounds (L hip incision)                       Personal Care Assistance Level of Assistance  Bathing, Feeding, Dressing Bathing Assistance:  Maximum assistance Feeding assistance: Limited assistance Dressing Assistance: Maximum assistance     Functional Limitations Info  Sight, Hearing, Speech Sight Info: Adequate Hearing Info: Adequate Speech Info: Adequate    SPECIAL CARE FACTORS FREQUENCY  PT (By licensed PT), OT (By licensed OT)     PT Frequency: 5x week OT Frequency: 5x week            Contractures Contractures Info: Not present    Additional Factors Info  Code Status, Allergies Code Status Info: FUll Allergies Info: Sulfa Antibiotics  Sulfonamide Derivatives  Methocarbamol  Pancrelipase (Lip-prot-amyl)  Sulfacetamide Sodium-sulfur           Current Medications (12/28/2022):  This is the current hospital active medication list Current Facility-Administered Medications  Medication Dose Route Frequency Provider Last Rate Last Admin   acetaminophen (TYLENOL) tablet 650 mg  650 mg Oral Q6H PRN Kathryne Hitch, MD       Or   acetaminophen (TYLENOL) suppository 650 mg  650 mg Rectal Q6H PRN Kathryne Hitch, MD       acetaminophen (TYLENOL) tablet 1,000 mg  1,000 mg Oral Q8H Kathryne Hitch, MD   1,000 mg at 12/28/22 0516   acidophilus (RISAQUAD) capsule 1 capsule  1 capsule Oral Daily Kathryne Hitch, MD   1 capsule at 12/28/22 1019   albuterol (PROVENTIL) (2.5 MG/3ML) 0.083% nebulizer solution 2.5 mg  2.5 mg Nebulization Q2H PRN Kathryne Hitch, MD  aspirin EC tablet 81 mg  81 mg Oral BID Marinda Elk, MD   81 mg at 12/28/22 1021   cholecalciferol (VITAMIN D3) 25 MCG (1000 UNIT) tablet 1,000 Units  1,000 Units Oral Daily Kathryne Hitch, MD   1,000 Units at 12/28/22 1020   cyanocobalamin (VITAMIN B12) tablet 1,000 mcg  1,000 mcg Oral Daily Kathryne Hitch, MD   1,000 mcg at 12/28/22 1020   feeding supplement (NEPRO CARB STEADY) liquid 237 mL  237 mL Oral BID BM Kathryne Hitch, MD   237 mL at 12/28/22 1021   ferrous gluconate  (FERGON) tablet 324 mg  324 mg Oral BID Kathryne Hitch, MD   324 mg at 12/28/22 1018   heparin injection 5,000 Units  5,000 Units Subcutaneous Q8H Kathryne Hitch, MD   5,000 Units at 12/28/22 0516   hydrALAZINE (APRESOLINE) injection 5 mg  5 mg Intravenous Q6H PRN Kathryne Hitch, MD       HYDROmorphone (DILAUDID) injection 0.5 mg  0.5 mg Intravenous Q4H PRN Kathryne Hitch, MD   0.5 mg at 12/27/22 0414   hydroxychloroquine (PLAQUENIL) tablet 200 mg  200 mg Oral BID Kathryne Hitch, MD   200 mg at 12/28/22 1020   leflunomide (ARAVA) tablet 10 mg  10 mg Oral Daily Kathryne Hitch, MD   10 mg at 12/28/22 1018   loratadine (CLARITIN) tablet 10 mg  10 mg Oral Daily Kathryne Hitch, MD   10 mg at 12/28/22 1019   menthol-cetylpyridinium (CEPACOL) lozenge 3 mg  1 lozenge Oral PRN Kathryne Hitch, MD       Or   phenol (CHLORASEPTIC) mouth spray 1 spray  1 spray Mouth/Throat PRN Kathryne Hitch, MD       metoCLOPramide (REGLAN) tablet 5-10 mg  5-10 mg Oral Q8H PRN Kathryne Hitch, MD       Or   metoCLOPramide (REGLAN) injection 5-10 mg  5-10 mg Intravenous Q8H PRN Kathryne Hitch, MD       multivitamin with minerals tablet 1 tablet  1 tablet Oral Daily Kathryne Hitch, MD   1 tablet at 12/28/22 1020   naphazoline-pheniramine (NAPHCON-A) 0.025-0.3 % ophthalmic solution 2 drop  2 drop Both Eyes PRN Kathryne Hitch, MD       ondansetron [facility]) tablet 4 mg  4 mg Oral Q6H PRN Kathryne Hitch, MD       Or   ondansetron [facility]) injection 4 mg  4 mg Intravenous Q6H PRN Kathryne Hitch, MD       oxybutynin (DITROPAN) tablet 5 mg  5 mg Oral BID Kathryne Hitch, MD   5 mg at 12/28/22 1020   oxyCODONE (Oxy IR/ROXICODONE) immediate release tablet 5 mg  5 mg Oral Q4H PRN Kathryne Hitch, MD   5 mg at 12/27/22 1729   pantoprazole (PROTONIX) EC tablet 40 mg  40 mg Oral Daily  Kathryne Hitch, MD   40 mg at 12/28/22 1019   polyethylene glycol (MIRALAX / GLYCOLAX) packet 17 g  17 g Oral Daily PRN Kathryne Hitch, MD       predniSONE (DELTASONE) tablet 5 mg  5 mg Oral Q breakfast Kathryne Hitch, MD   5 mg at 12/28/22 1020   pregabalin (LYRICA) capsule 75 mg  75 mg Oral Daily Kathryne Hitch, MD   75 mg at 12/28/22 1020   pyridOXINE (VITAMIN B6) tablet 100 mg  100 mg  Oral Daily Kathryne Hitch, MD   100 mg at 12/28/22 1017   sodium chloride flush (NS) 0.9 % injection 3 mL  3 mL Intravenous Q12H Kathryne Hitch, MD   3 mL at 12/28/22 1021     Discharge Medications: Please see discharge summary for a list of discharge medications.  Relevant Imaging Results:  Relevant Lab Results:   Additional Information SS# [id number] [place]

## 2022-12-28 NOTE — Plan of Care (Signed)

## 2022-12-28 NOTE — TOC Initial Note (Signed)
Transition of Care [facility]) - Initial/Assessment Note    Patient Details  Name: Holly Jimenez MRN: [id number] Date of Birth: 07-11-1945  Transition of Care [facility] CM/SW Contact:    Carley Hammed, LCSW Phone Number: 12/28/2022, 12:45 PM  Clinical Narrative:                 CSW noted recommendation for SNF and spoke with pt to discuss. Pt notes she is agreeable and has a preference for one "off [address]" or [employer]. She is unsure of name of first choice, but agreeable for faxout for options. Pt requests CSW speak with son Denice Paradise to provide updates. CSW spoke with [facility] who is also agreeable to plan. He is also unsure of the name of the facility but will find out. CSW to complete workup and fax pt out for offers.  Expected Discharge Plan: Skilled Nursing Facility Barriers to Discharge: Insurance Authorization, SNF Pending bed offer, Continued Medical Work up   Patient Goals and CMS Choice Patient states their goals for this hospitalization and ongoing recovery are:: Pt would like to regain independence. CMS Medicare.gov Compare Post Acute Care list provided to:: Patient Choice offered to / list presented to : Patient      Expected Discharge Plan and Services In-house Referral: Clinical Social Work   Post Acute Care Choice: Skilled Nursing Facility Living arrangements for the past 2 months: Single Family Home                                      Prior Living Arrangements/Services Living arrangements for the past 2 months: Single Family Home Lives with:: Self Patient language and need for interpreter reviewed:: Yes Do you feel safe going back to the place where you live?: Yes      Need for Family Participation in Patient Care: Yes (Comment) Care giver support system in place?: Yes (comment)   Criminal Activity/Legal Involvement Pertinent to Current Situation/Hospitalization: No - Comment as needed  Activities of Daily Living   ADL Screening (condition  at time of admission) Independently performs ADLs?: Yes (appropriate for developmental age) Is the patient deaf or have difficulty hearing?: No Does the patient have difficulty seeing, even when wearing glasses/contacts?: Yes Does the patient have difficulty concentrating, remembering, or making decisions?: No  Permission Sought/Granted Permission sought to share information with : Family Supports Permission granted to share information with : Yes, Verbal Permission Granted  Share Information with NAME: [place]     Permission granted to share info w Relationship: Son     Emotional Assessment Appearance:: Appears stated age Attitude/Demeanor/Rapport: Engaged Affect (typically observed): Appropriate Orientation: : Oriented to Self, Oriented to Place, Oriented to  Time, Oriented to Situation Alcohol / Substance Use: Not Applicable Psych Involvement: No (comment)  Admission diagnosis:  Left hip pain [M25.552] Fall, initial encounter [W19.XXXA] Acute pain of left hip [M25.552] Patient Active Problem List   Diagnosis Date Noted   Closed displaced fracture of left femoral neck (HCC) 12/25/2022   Protein-calorie malnutrition, severe 12/25/2022   Acute pain of left hip 12/24/2022   CKD stage 3b, GFR 30-44 ml/min (HCC) 12/24/2022   Abnormal TSH 05/31/2019   Gait abnormality 05/30/2019   Paresthesia 05/30/2019   Low back pain without sciatica 05/30/2019   Subdural hematoma (HCC) 11/05/2016   Compression fracture of body of thoracic vertebra (HCC) 03/08/2014   Neuropathy 02/16/2014   Ulcer of the stomach  and intestine 02/16/2014   Raynaud disease 02/16/2014   Abnormal ECG 02/16/2014   Essential hypertension 02/16/2014   Abnormal EKG 02/16/2014   NONSPECIFIC ABN FINDING RAD & OTH EXAM GI TRACT 08/22/2008   PCP:  Irena Reichmann, DO Pharmacy:   [facility] [place] [place] - [address] [place] [address] Phone: [phone]  Fax: [phone]     Social Drivers of Health (SDOH) Social History: SDOH Screenings   Food Insecurity: No Food Insecurity (12/24/2022)  Housing: Low Risk  (12/24/2022)  Transportation Needs: No Transportation Needs (12/24/2022)  Utilities: Not At Risk (12/24/2022)  Depression (PHQ2-9): Low Risk  (10/23/2022)  Tobacco Use: Medium Risk (12/26/2022)   SDOH Interventions:     Readmission Risk Interventions     No data to display

## 2022-12-28 NOTE — Plan of Care (Signed)
Patient is alert and oriented x 4. Continue on  room air. Denies chest pain, chest pressure or sob.  Pain managed with  with schedule  medication.  No acute distress  noted during this shift.  Left hip dressing clean dry and intact. Bed lock in lowest position.  Call light place at reach. Problem: Education: Goal: Knowledge of General Education information will improve Description: Including pain rating scale, medication(s)/side effects and non-pharmacologic comfort measures Outcome: Progressing   Problem: Health Behavior/Discharge Planning: Goal: Ability to manage health-related needs will improve Outcome: Progressing   Problem: Clinical Measurements: Goal: Ability to maintain clinical measurements within normal limits will improve Outcome: Progressing Goal: Will remain free from infection Outcome: Progressing Goal: Diagnostic test results will improve Outcome: Progressing Goal: Respiratory complications will improve Outcome: Progressing Goal: Cardiovascular complication will be avoided Outcome: Progressing   Problem: Activity: Goal: Risk for activity intolerance will decrease Outcome: Progressing   Problem: Nutrition: Goal: Adequate nutrition will be maintained Outcome: Progressing   Problem: Coping: Goal: Level of anxiety will decrease Outcome: Progressing   Problem: Elimination: Goal: Will not experience complications related to bowel motility Outcome: Progressing Goal: Will not experience complications related to urinary retention Outcome: Progressing   Problem: Pain Management: Goal: General experience of comfort will improve Outcome: Progressing   Problem: Safety: Goal: Ability to remain free from injury will improve Outcome: Progressing   Problem: Skin Integrity: Goal: Risk for impaired skin integrity will decrease Outcome: Progressing   Problem: Education: Goal: Knowledge of the prescribed therapeutic regimen will improve Outcome: Progressing Goal:  Understanding of discharge needs will improve Outcome: Progressing Goal: Individualized Educational Video(s) Outcome: Progressing   Problem: Activity: Goal: Ability to avoid complications of mobility impairment will improve Outcome: Progressing Goal: Ability to tolerate increased activity will improve Outcome: Progressing   Problem: Clinical Measurements: Goal: Postoperative complications will be avoided or minimized Outcome: Progressing   Problem: Pain Management: Goal: Pain level will decrease with appropriate interventions Outcome: Progressing   Problem: Skin Integrity: Goal: Will show signs of wound healing Outcome: Progressing

## 2022-12-28 NOTE — Progress Notes (Addendum)
[facility] PROGRESS NOTE    Progress Note  Holly Jimenez  QMV:[id number] DOB: 05-14-1945 DOA: 12/24/2022 PCP: Irena Reichmann, DO     Brief Narrative:   Holly Jimenez is an 77 y.o. female past medical history of renal disease, chronic kidney disease stage IIIb, essential hypertension, peptic ulcer disease comes into the ED with sudden onset of left hip pain after mechanical fall was found to have a closed displaced left femoral neck fracture   Assessment/Plan:   Acute closed displaced fracture of left femoral neck (HCC): Likely secondary to mechanical fall. CT of the left hip obtained showed acute subcapital left femoral neck fracture. Orthopedic surgery was consulted she status post left total hip arthroplasty 12/26/2022 Continue narcotics per orthopedic surgery. PT evaluated the patient will need skilled nursing facility placement.  TOC team has been informed. Aspirin for DVT prophylaxis per orthopedic surgery.  Leukocytosis: Likely reactive, she has remained afebrile. Continue monitor closely.  Peptic ulcer disease history of: Continue PPI.  Essential hypertension: Blood pressure remains below 120s continue to hold Norvasc and Cozaar. Continue to monitor blood pressure closely.  Chronic kidney disease stage IIIb: Current has remained at baseline.  1.3-1.6  History of Raynaud's disease: Continue Plaquenil.  Anemia of chronic renal disease: Hemoglobin has remained relatively stable.    DVT prophylaxis: lovenox Family Communication:none Status is: Inpatient Remains inpatient appropriate because: Acute left hip fracture    Code Status:     Code Status Orders  (From admission, onward)           Start     Ordered   12/24/22 1910  Full code  Continuous       Question:  By:  Answer:  Consent: discussion documented in EHR   12/24/22 1913           Code Status History     Date Active Date Inactive Code Status Order ID  Comments User Context   12/24/2022 1554 12/24/2022 1913 Full Code [id number]  Rodolph Bong, MD ED   03/08/2014 1806 03/09/2014 2145 Full Code [id number]  Coletta Memos, MD Inpatient      Advance Directive Documentation    Flowsheet Row Most Recent Value  Type of Advance Directive Healthcare Power of Attorney  Pre-existing out of facility DNR order (yellow form or pink MOST form) --  "MOST" Form in Place? --         IV Access:   Peripheral IV   Procedures and diagnostic studies:   DG Pelvis Portable Result Date: 12/26/2022 CLINICAL DATA:  Postop EXAM: PORTABLE PELVIS 1-2 VIEWS COMPARISON:  12/24/2022 FINDINGS: Interval left hip replacement with intact hardware and normal alignment. Cutaneous staples and gas in the soft tissues consistent with recent surgery IMPRESSION: Status post left hip replacement with expected postsurgical change. Electronically Signed   By: Jasmine Pang M.D.   On: 12/26/2022 22:31   DG HIP UNILAT WITH PELVIS 1V LEFT Result Date: 12/26/2022 CLINICAL DATA:  Hip arthroplasty EXAM: DG HIP (WITH OR WITHOUT PELVIS) 1V*L* COMPARISON:  CT 12/24/2022 FINDINGS: Three low resolution intraoperative spot views of the left hip. Total fluoroscopy time was 24.4 seconds, fluoroscopic dose of 1.3 mGy. The images demonstrate left hip replacement with intact hardware and normal alignment. IMPRESSION: Intraoperative fluoroscopic assistance provided during left hip replacement. Electronically Signed   By: Jasmine Pang M.D.   On: 12/26/2022 22:30   DG C-Arm 1-60 Min-No Report Result Date: 12/26/2022 Fluoroscopy was utilized by the requesting physician.  No radiographic interpretation.  Medical Consultants:   None.   Subjective:    Holly Jimenez having regular bowel movements relate her pain is controlled.  Objective:    Vitals:   12/27/22 1700 12/27/22 2002 12/28/22 0428 12/28/22 0742  BP: 104/84 (!) 119/48 (!) 122/47 (!) 118/50  Pulse: 84 77 71  70  Resp: 20 16 14 16   Temp: 99.2 F (37.3 C) 99.1 F (37.3 C) 99.2 F (37.3 C) 98.7 F (37.1 C)  TempSrc: Oral Oral Oral Oral  SpO2:  90% 90% 90%  Weight:      Height:       SpO2: 90 % O2 Flow Rate (L/min): 2 L/min   Intake/Output Summary (Last 24 hours) at 12/28/2022 0809 Last data filed at 12/28/2022 6387 Gross per 24 hour  Intake 485.31 ml  Output 2100 ml  Net -1614.69 ml   Filed Weights   12/24/22 1202  Weight: 37.2 kg    Exam: General exam: In no acute distress. Respiratory system: Good air movement and clear to auscultation. Cardiovascular system: S1 & S2 heard, RRR. No JVD. Gastrointestinal system: Abdomen is nondistended, soft and nontender.  Extremities: No pedal edema. Skin: No rashes, lesions or ulcers Psychiatry: Judgement and insight appear normal. Mood & affect appropriate.  Data Reviewed:    Labs: Basic Metabolic Panel: Recent Labs  Lab 12/24/22 1400 12/25/22 0600 12/26/22 0648  NA 135 134* 135  K 4.1 4.8 4.8  CL 105 106 109  CO2 19* 22 18*  GLUCOSE 103* 68* 114*  BUN 22 20 25*  CREATININE 1.49* 1.56* 1.47*  CALCIUM 9.8 9.2 8.7*  MG 2.1  --   --   PHOS 3.2  --   --    GFR Estimated Creatinine Clearance: 18.2 mL/min (A) (by C-G formula based on SCr of 1.47 mg/dL (H)). Liver Function Tests: Recent Labs  Lab 12/25/22 0600  ALBUMIN 3.0*   No results for input(s): "LIPASE", "AMYLASE" in the last 168 hours. No results for input(s): "AMMONIA" in the last 168 hours. Coagulation profile No results for input(s): "INR", "PROTIME" in the last 168 hours. COVID-19 Labs  Recent Labs    12/25/22 1103  FERRITIN 699*    Lab Results  Component Value Date   SARSCOV2NAA Not Detected 01/13/2019    CBC: Recent Labs  Lab 12/24/22 1400 12/25/22 0600 12/26/22 0648  WBC 10.2 11.3* 13.3*  NEUTROABS 8.5*  --  11.7*  HGB 11.2* 10.8* 10.1*  HCT 35.6* 34.3* 32.7*  MCV 88.1 88.6 89.3  PLT 211 183 174   Cardiac Enzymes: No results for  input(s): "CKTOTAL", "CKMB", "CKMBINDEX", "TROPONINI" in the last 168 hours. BNP (last 3 results) No results for input(s): "PROBNP" in the last 8760 hours. CBG: No results for input(s): "GLUCAP" in the last 168 hours. D-Dimer: No results for input(s): "DDIMER" in the last 72 hours. Hgb A1c: No results for input(s): "HGBA1C" in the last 72 hours. Lipid Profile: No results for input(s): "CHOL", "HDL", "LDLCALC", "TRIG", "CHOLHDL", "LDLDIRECT" in the last 72 hours. Thyroid function studies: No results for input(s): "TSH", "T4TOTAL", "T3FREE", "THYROIDAB" in the last 72 hours.  Invalid input(s): "FREET3" Anemia work up: Recent Labs    12/25/22 1103  VITAMINB12 3,866*  FOLATE >40.0  FERRITIN 699*  TIBC 202*  IRON 33   Sepsis Labs: Recent Labs  Lab 12/24/22 1400 12/25/22 0600 12/26/22 0648  WBC 10.2 11.3* 13.3*   Microbiology Recent Results (from the past 240 hours)  Surgical pcr screen     Status:  None   Collection Time: 12/25/22  5:00 AM   Specimen: Nasal Mucosa; Nasal Swab  Result Value Ref Range Status   MRSA, PCR NEGATIVE NEGATIVE Final   Staphylococcus aureus NEGATIVE NEGATIVE Final    Comment: (NOTE) The Xpert SA Assay (FDA approved for NASAL specimens in patients 32 years of age and older), is one component of a comprehensive surveillance program. It is not intended to diagnose infection nor to guide or monitor treatment. Performed at [facility], [address] [place], [place] [address]      Medications:    acetaminophen  1,000 mg Oral Q8H   acidophilus  1 capsule Oral Daily   cholecalciferol  1,000 Units Oral Daily   cyanocobalamin  1,000 mcg Oral Daily   docusate sodium  100 mg Oral BID   feeding supplement (NEPRO CARB STEADY)  237 mL Oral BID BM   ferrous gluconate  324 mg Oral BID   heparin  5,000 Units Subcutaneous Q8H   hydroxychloroquine  200 mg Oral BID   leflunomide  10 mg Oral Daily   loratadine  10 mg Oral Daily   multivitamin  with minerals  1 tablet Oral Daily   oxybutynin  5 mg Oral BID   pantoprazole  40 mg Oral Daily   predniSONE  5 mg Oral Q breakfast   pregabalin  75 mg Oral Daily   pyridOXINE  100 mg Oral Daily   senna  1 tablet Oral BID   sodium chloride flush  3 mL Intravenous Q12H   Continuous Infusions:  sodium chloride Stopped (12/27/22 0856)      LOS: 3 days   Marinda Elk  [facility]  12/28/2022, 8:09 AM

## 2022-12-29 ENCOUNTER — Encounter (HOSPITAL_COMMUNITY): Payer: Self-pay | Admitting: Orthopaedic Surgery

## 2022-12-29 DIAGNOSIS — S72002A Fracture of unspecified part of neck of left femur, initial encounter for closed fracture: Secondary | ICD-10-CM | POA: Diagnosis not present

## 2022-12-29 MED ORDER — ASPIRIN 81 MG PO TBEC: 81.0000 mg | DELAYED_RELEASE_TABLET | Freq: Two times a day (BID) | ORAL | 12 refills | Status: DC

## 2022-12-29 NOTE — Progress Notes (Addendum)
Mobility Specialist: Progress Note   12/29/22 1420  Mobility  Activity Transferred from bed to chair  Level of Assistance Moderate assist, patient does 50-74%  Assistive Device Front wheel walker  LLE Weight Bearing Per Provider Order WBAT  Activity Response Tolerated well  Mobility Referral Yes  Mobility visit 1 Mobility  Mobility Specialist Start Time (ACUTE ONLY) 1306  Mobility Specialist Stop Time (ACUTE ONLY) 1325  Mobility Specialist Time Calculation (min) (ACUTE ONLY) 19 min    Pt was agreeable to mobility session - received in bed. C/o anterior L hip pain when sitting up. ModA for bed mobility to assist with LLE, scooting, and trunk elevation. ModA for STS and transfer via stand pivot. Left in chair with all needs met, call bell in reach.   MS returned to transfer pt back to bed. ModA throughout. Left in bed with all needs met, call bell in reach.   Holly Jimenez Mobility Specialist Please contact via SecureChat or Rehab office at [phone]

## 2022-12-29 NOTE — Progress Notes (Signed)
PROGRESS NOTE  Holly Jimenez  DOB: 05/13/1945  PCP: Irena Reichmann, DO VOZ:[id number]  DOA: 12/24/2022  LOS: 4 days  Hospital Day: 6  Brief narrative: Holly Jimenez is a 77 y.o. female with PMH significant for HTN, HLD, CKD, chronic anemia, chronic back pain, anxiety, GERD, neuropathy, Mnire's disease, peptic ulcer. 12/18, patient presented to the ED with sudden onset of left hip pain after mechanical fall. On workup, CT of the left hip showed closed displaced left femoral neck fracture. Admitted to [facility] Orthopedic surgery consulted 12/20, underwent left THA by Dr. Magnus Ivan  Subjective: Patient was seen and examined this morning.  Pleasant elderly African-American female. Lying on bed.  Not in distress.  No new symptoms. Chart reviewed. In the last 24 hours, afebrile, hemodynamically stable, breathing room air Last set of labs from 12/20 with WC count 13.3, hemoglobin 10.1, creatinine 25/1.47  Assessment and plan: Acute closed displaced fracture of left femoral neck S/p left THA -12/20 -Dr. Magnus Ivan Likely secondary to mechanical fall Imaging and procedure as above Continue pain management with PRN Dilaudid, oxycodone, Tylenol DVT prophylaxis-currently on aspirin 81 mg twice daily.  Not sure why she is on heparin subcu as well.  I will stop heparin PT eval recommended SNF  Leukocytosis Likely reactive, she has remained afebrile. Continue monitor closely. Recent Labs  Lab 12/24/22 1400 12/25/22 0600 12/26/22 0648  WBC 10.2 11.3* 13.3*   Essential hypertension PTA meds- amlodipine 5 mg twice daily, losartan 50 mg daily Blood pressure currently controlled without meds.  Continue to monitor  CKD 3b Acute metabolic acidosis Creatinine stable Last measured serum bicarb level was low at 18.  Repeat labs tomorrow  Recent Labs    12/24/22 1400 12/25/22 0600 12/26/22 0648  BUN 22 20 25*  CREATININE 1.49* 1.56* 1.47*  CO2 19* 22 18*   H/o  arthritis?? Raynaud's disease Continue Plaquenil 200 mg twice daily, leflunomide 10 mg daily,  Also on prednisone 5 mg daily chronically  Anemia of chronic disease H/o peptic ulcer disease Hemoglobin has remained relatively stable. Continue PPI Recent Labs    12/24/22 1400 12/25/22 0600 12/25/22 1103 12/26/22 0648  HGB 11.2* 10.8*  --  10.1*  MCV 88.1 88.6  --  89.3  VITAMINB12  --   --  3,866*  --   FOLATE  --   --  >40.0  --   FERRITIN  --   --  699*  --   TIBC  --   --  202*  --   IRON  --   --  33  --    Neuropathy  Continue Lyrica 75 mg daily, pyridoxine 100 mg daily   Mobility: Encourage ambulation  Goals of care   Code Status: Full Code     DVT prophylaxis:  SCDs Start: 12/26/22 2033 SCDs Start: 12/24/22 1911   Antimicrobials: None Fluid: None Consultants: Orthopedics Family Communication: None at bedside  Status: Inpatient Level of care:  Med-Surg   Patient is from: Home Needs to continue in-hospital care: SNF   Diet:  Diet Order             Diet regular Room service appropriate? Yes; Fluid consistency: Thin  Diet effective now                   Scheduled Meds:  acetaminophen  1,000 mg Oral Q8H   acidophilus  1 capsule Oral Daily   aspirin EC  81 mg Oral BID   cholecalciferol  1,000  Units Oral Daily   cyanocobalamin  1,000 mcg Oral Daily   feeding supplement (NEPRO CARB STEADY)  237 mL Oral BID BM   ferrous gluconate  324 mg Oral BID   hydroxychloroquine  200 mg Oral BID   leflunomide  10 mg Oral Daily   loratadine  10 mg Oral Daily   multivitamin with minerals  1 tablet Oral Daily   oxybutynin  5 mg Oral BID   pantoprazole  40 mg Oral Daily   predniSONE  5 mg Oral Q breakfast   pregabalin  75 mg Oral Daily   pyridOXINE  100 mg Oral Daily   sodium chloride flush  3 mL Intravenous Q12H    PRN meds: acetaminophen **OR** acetaminophen, albuterol, hydrALAZINE, HYDROmorphone (DILAUDID) injection, menthol-cetylpyridinium **OR**  phenol, metoCLOPramide **OR** metoCLOPramide (REGLAN) injection, naphazoline-pheniramine, ondansetron **OR** ondansetron (ZOFRAN) IV, oxyCODONE, polyethylene glycol   Infusions:    Antimicrobials: Anti-infectives (From admission, onward)    Start     Dose/Rate Route Frequency Ordered Stop   12/27/22 0000  ceFAZolin (ANCEF) IVPB 2g/100 mL premix        2 g 200 mL/hr over 30 Minutes Intravenous Every 6 hours 12/26/22 2032 12/27/22 0708   12/26/22 1145  ceFAZolin (ANCEF) IVPB 2g/100 mL premix        2 g 200 mL/hr over 30 Minutes Intravenous On call to O.R. 12/26/22 1053 12/26/22 1849   12/24/22 2200  hydroxychloroquine (PLAQUENIL) tablet 200 mg        200 mg Oral 2 times daily 12/24/22 1554         Objective: Vitals:   12/29/22 0913 12/29/22 1328  BP: (!) 122/52 134/61  Pulse: 73 75  Resp: 18 18  Temp: 98.9 F (37.2 C) 98.5 F (36.9 C)  SpO2: 94% 95%    Intake/Output Summary (Last 24 hours) at 12/29/2022 1336 Last data filed at 12/28/2022 1434 Gross per 24 hour  Intake 237 ml  Output 1000 ml  Net -763 ml   Filed Weights   12/24/22 1202  Weight: 37.2 kg   Weight change:  Body mass index is 15.49 kg/m.   Physical Exam: General exam: Pleasant, elderly African-American female. Skin: No rashes, lesions or ulcers. HEENT: Atraumatic, normocephalic, no obvious bleeding Lungs: Clear to auscultation bilaterally CVS: S1, S2, no murmur GI/Abd: Soft, nontender, nondistended, bowel sound present CNS: Alert, awake, oriented x 3 Psychiatry: Mood appropriate Extremities: No pedal edema, no calf tenderness  Data Review: I have personally reviewed the laboratory data and studies available.  F/u labs  Unresulted Labs (From admission, onward)     Start     Ordered   12/30/22 0500  CBC with Differential/Platelet  Tomorrow morning,   R       Question:  Specimen collection method  Answer:  Lab=Lab collect   12/29/22 0933   12/30/22 0500  Basic metabolic panel  Tomorrow  morning,   R       Question:  Specimen collection method  Answer:  Lab=Lab collect   12/29/22 0933           Total time spent in review of labs and imaging, patient evaluation, formulation of plan, documentation and communication with family: 45 minutes  Signed, Lorin Glass, MD [facility] 12/29/2022

## 2022-12-29 NOTE — Progress Notes (Addendum)
Subjective: 3 Days Post-Op Procedure(s) (LRB): TOTAL HIP ARTHROPLASTY ANTERIOR APPROACH (Left) Patient reports pain as mild.  Siting up in bed eating. No complaints.   Objective: Vital signs in last 24 hours: Temp:  [98.5 F (36.9 C)-99.2 F (37.3 C)] 98.9 F (37.2 C) (12/23 0913) Pulse Rate:  [64-73] 73 (12/23 0913) Resp:  [18] 18 (12/23 0913) BP: (122-135)/(44-60) 122/52 (12/23 0913) SpO2:  [90 %-94 %] 94 % (12/23 0913)  Intake/Output from previous day: 12/22 0701 - 12/23 0700 In: 480 [P.O.:477; I.V.:3] Out: 1400 [Urine:1400] Intake/Output this shift: No intake/output data recorded.  No results for input(s): "HGB" in the last 72 hours. No results for input(s): "WBC", "RBC", "HCT", "PLT" in the last 72 hours. No results for input(s): "NA", "K", "CL", "CO2", "BUN", "CREATININE", "GLUCOSE", "CALCIUM" in the last 72 hours. No results for input(s): "LABPT", "INR" in the last 72 hours.  Dorsiflexion/Plantar flexion intact Incision: moderate drainage Compartment soft   Assessment/Plan: 3 Days Post-Op Procedure(s) (LRB): TOTAL HIP ARTHROPLASTY ANTERIOR APPROACH (Left) Up with therapy Dressing changed.  ASA for DVT prophylaxis  SNF when medically ready and  bed available.     Holly Jimenez 12/29/2022, 12:44 PM

## 2022-12-29 NOTE — TOC Progression Note (Signed)
Transition of Care [facility]) - Progression Note    Patient Details  Name: Holly Jimenez MRN: [id number] Date of Birth: 03-06-1945  [facility] CM/SW Contact  Erin Sons, [place] Phone Number: 12/29/2022, 12:58 PM  Clinical Narrative:     CSW met with pt bedside and provided SNF bed offers. Pt chooses [facility]. CSW confirmed with Phineas Semen they can admit pt tomorrow if auth approved. CSW left message with [facility] [employer] requesting return call.   Expected Discharge Plan: Skilled Nursing Facility Barriers to Discharge: English as a second language teacher, Continued Medical Work up  Expected Discharge Plan and Services In-house Referral: Clinical Social Work   Post Acute Care Choice: Skilled Nursing Facility Living arrangements for the past 2 months: Single Family Home                                       Social Determinants of Health (SDOH) Interventions SDOH Screenings   Food Insecurity: No Food Insecurity (12/24/2022)  Housing: Low Risk  (12/24/2022)  Transportation Needs: No Transportation Needs (12/24/2022)  Utilities: Not At Risk (12/24/2022)  Depression (PHQ2-9): Low Risk  (10/23/2022)  Tobacco Use: Medium Risk (12/26/2022)    Readmission Risk Interventions     No data to display

## 2022-12-30 DIAGNOSIS — N1832 Chronic kidney disease, stage 3b: Secondary | ICD-10-CM | POA: Diagnosis not present

## 2022-12-30 DIAGNOSIS — E43 Unspecified severe protein-calorie malnutrition: Secondary | ICD-10-CM | POA: Diagnosis not present

## 2022-12-30 DIAGNOSIS — Z7401 Bed confinement status: Secondary | ICD-10-CM | POA: Diagnosis not present

## 2022-12-30 DIAGNOSIS — S72002D Fracture of unspecified part of neck of left femur, subsequent encounter for closed fracture with routine healing: Secondary | ICD-10-CM | POA: Diagnosis not present

## 2022-12-30 DIAGNOSIS — Z9181 History of falling: Secondary | ICD-10-CM | POA: Diagnosis not present

## 2022-12-30 DIAGNOSIS — M069 Rheumatoid arthritis, unspecified: Secondary | ICD-10-CM | POA: Diagnosis not present

## 2022-12-30 DIAGNOSIS — S72002A Fracture of unspecified part of neck of left femur, initial encounter for closed fracture: Secondary | ICD-10-CM | POA: Diagnosis not present

## 2022-12-30 DIAGNOSIS — R2689 Other abnormalities of gait and mobility: Secondary | ICD-10-CM | POA: Diagnosis not present

## 2022-12-30 DIAGNOSIS — I10 Essential (primary) hypertension: Secondary | ICD-10-CM | POA: Diagnosis not present

## 2022-12-30 DIAGNOSIS — M6281 Muscle weakness (generalized): Secondary | ICD-10-CM | POA: Diagnosis not present

## 2022-12-30 DIAGNOSIS — M25552 Pain in left hip: Secondary | ICD-10-CM | POA: Diagnosis not present

## 2022-12-30 DIAGNOSIS — R531 Weakness: Secondary | ICD-10-CM | POA: Diagnosis not present

## 2022-12-30 DIAGNOSIS — M6259 Muscle wasting and atrophy, not elsewhere classified, multiple sites: Secondary | ICD-10-CM | POA: Diagnosis not present

## 2022-12-30 DIAGNOSIS — Z4789 Encounter for other orthopedic aftercare: Secondary | ICD-10-CM | POA: Diagnosis not present

## 2022-12-30 LAB — CBC WITH DIFFERENTIAL/PLATELET
Abs Immature Granulocytes: 0.05 10*3/uL (ref 0.00–0.07)
Basophils Absolute: 0 10*3/uL (ref 0.0–0.1)
Basophils Relative: 0 %
Eosinophils Absolute: 0.4 10*3/uL (ref 0.0–0.5)
Eosinophils Relative: 4 %
HCT: 29.7 % — ABNORMAL LOW (ref 36.0–46.0)
Hemoglobin: 9.2 g/dL — ABNORMAL LOW (ref 12.0–15.0)
Immature Granulocytes: 1 %
Lymphocytes Relative: 21 %
Lymphs Abs: 2.1 10*3/uL (ref 0.7–4.0)
MCH: 27.4 pg (ref 26.0–34.0)
MCHC: 31 g/dL (ref 30.0–36.0)
MCV: 88.4 fL (ref 80.0–100.0)
Monocytes Absolute: 1 10*3/uL (ref 0.1–1.0)
Monocytes Relative: 10 %
Neutro Abs: 6.4 10*3/uL (ref 1.7–7.7)
Neutrophils Relative %: 64 %
Platelets: 213 10*3/uL (ref 150–400)
RBC: 3.36 MIL/uL — ABNORMAL LOW (ref 3.87–5.11)
RDW: 14.6 % (ref 11.5–15.5)
WBC: 10 10*3/uL (ref 4.0–10.5)
nRBC: 0.3 % — ABNORMAL HIGH (ref 0.0–0.2)

## 2022-12-30 LAB — BASIC METABOLIC PANEL
Anion gap: 7 (ref 5–15)
BUN: 23 mg/dL (ref 8–23)
CO2: 23 mmol/L (ref 22–32)
Calcium: 9 mg/dL (ref 8.9–10.3)
Chloride: 106 mmol/L (ref 98–111)
Creatinine, Ser: 1.06 mg/dL — ABNORMAL HIGH (ref 0.44–1.00)
GFR, Estimated: 53 mL/min — ABNORMAL LOW (ref >60)
Glucose, Bld: 95 mg/dL (ref 70–99)
Potassium: 4.9 mmol/L (ref 3.5–5.1)
Sodium: 136 mmol/L (ref 135–145)

## 2022-12-30 MED ORDER — ASPIRIN 81 MG PO TBEC: 81.0000 mg | DELAYED_RELEASE_TABLET | Freq: Two times a day (BID) | ORAL | Status: AC

## 2022-12-30 MED ORDER — NEPRO/CARBSTEADY PO LIQD: 237.0000 mL | Freq: Two times a day (BID) | ORAL | Status: AC

## 2022-12-30 MED ORDER — ACETAMINOPHEN 500 MG PO TABS: 1000.0000 mg | ORAL_TABLET | Freq: Three times a day (TID) | ORAL | Status: AC

## 2022-12-30 MED ORDER — POLYETHYLENE GLYCOL 3350 17 G PO PACK: 17.0000 g | PACK | Freq: Every day | ORAL | 0 refills | Status: AC | PRN

## 2022-12-30 NOTE — Plan of Care (Signed)

## 2022-12-30 NOTE — Plan of Care (Signed)
  Problem: Education: Goal: Knowledge of General Education information will improve Description: Including pain rating scale, medication(s)/side effects and non-pharmacologic comfort measures Outcome: Progressing   Problem: Activity: Goal: Risk for activity intolerance will decrease Outcome: Progressing   Problem: Nutrition: Goal: Adequate nutrition will be maintained Outcome: Progressing   Problem: Elimination: Goal: Will not experience complications related to bowel motility Outcome: Progressing   Problem: Pain Management: Goal: General experience of comfort will improve Outcome: Progressing

## 2022-12-30 NOTE — Progress Notes (Signed)
Report given to Ayo,LPN at [facility], all questions and concerns were fully answered.

## 2022-12-30 NOTE — Discharge Summary (Signed)
Physician Discharge Summary  Holly Jimenez NWG:[id number] DOB: May 24, 1945 DOA: 12/24/2022  PCP: Irena Reichmann, DO  Admit date: 12/24/2022 Discharge date: 12/30/2022  Admitted From: Home Discharge disposition: SNF  Recommendations at discharge:  Cautious use of pain medicines DVT prophylaxis with aspirin 81 mg twice daily for 3 weeks Both of your blood pressure medicines (amlodipine and losartan are on hold) because your blood pressure is in normal range without meds.  Continue to monitor blood pressure was discharged and resume one medicine at a time if systolic blood pressure is over 140.  Brief narrative: Holly Jimenez is a 77 y.o. female with PMH significant for HTN, HLD, CKD, chronic anemia, chronic back pain, anxiety, GERD, neuropathy, Mnire's disease, peptic ulcer. 12/18, patient presented to the ED with sudden onset of left hip pain after mechanical fall. On workup, CT of the left hip showed closed displaced left femoral neck fracture. Admitted to [facility] Orthopedic surgery consulted 12/20, underwent left THA by Dr. Magnus Ivan  Subjective: Patient was seen and examined this morning.  Pleasant elderly African-American female. Lying on bed.  Not in distress.  No new symptoms. Ready for discharge today.  Assessment and plan: Acute closed displaced fracture of left femoral neck S/p left THA -12/20 -Dr. Magnus Ivan Likely secondary to mechanical fall Imaging and procedure as above Continue pain management with Tylenol and as needed oxycodone DVT prophylaxis-currently on aspirin 81 mg twice daily for 3 weeks.   PT eval recommended SNF  Leukocytosis Likely reactive, she has remained afebrile. WC count normalized on labs today. Recent Labs  Lab 12/24/22 1400 12/25/22 0600 12/26/22 0648 12/30/22 0537  WBC 10.2 11.3* 13.3* 10.0   Essential hypertension PTA meds- amlodipine 5 mg twice daily, losartan 50 mg daily Blood pressure currently controlled  without meds.  Continue to monitor I would keep both these medicines on hold at discharge.  Can resume one at a time if blood pressure starts to rise up.  CKD 3b Acute metabolic acidosis Creatinine and bicarb level have both improved on repeat labs today.  Recent Labs    12/24/22 1400 12/25/22 0600 12/26/22 0648 12/30/22 0537  BUN 22 20 25* 23  CREATININE 1.49* 1.56* 1.47* 1.06*  CO2 19* 22 18* 23   H/o arthritis?? Raynaud's disease Continue Plaquenil 200 mg twice daily, leflunomide 10 mg daily,  Also on prednisone 5 mg daily chronically  Anemia of chronic disease H/o peptic ulcer disease Hemoglobin has remained relatively stable. Continue PPI  Neuropathy  Continue Lyrica 75 mg daily, pyridoxine 100 mg daily   Goals of care   Code Status: Full Code   Diet:  Diet Order             Diet general           Diet regular Room service appropriate? Yes; Fluid consistency: Thin  Diet effective now                   Nutritional status:  Body mass index is 15.49 kg/m.  Nutrition Problem: Severe Malnutrition Etiology: chronic illness Signs/Symptoms: percent weight loss, energy intake < 75% for > or equal to 3 months, per patient/family report, severe fat depletion, mild fat depletion, moderate fat depletion, severe muscle depletion, moderate muscle depletion Percent weight loss: 15 % (>15%)  Wounds:  - Incision (Closed) 12/26/22 Hip (Active)  Date First Assessed/Time First Assessed: 12/26/22 1902   Location: Hip    Assessments 12/26/2022  7:42 PM 12/30/2022  7:09 AM  Dressing Type  Silver hydrofiber Silver hydrofiber  Dressing Clean, Dry, Intact Clean, Dry, Intact  Drainage Amount None None     No associated orders.    Discharge Exam:   Vitals:   12/29/22 1328 12/29/22 2005 12/30/22 0605 12/30/22 1005  BP: 134/61 (!) 125/55 (!) 119/57 139/72  Pulse: 75 71 63 67  Resp: 18 16  18   Temp: 98.5 F (36.9 C) 99.2 F (37.3 C) 98.9 F (37.2 C) 98 F (36.7  C)  TempSrc: Oral Oral Oral Oral  SpO2: 95% 95% 94% 96%  Weight:      Height:        Body mass index is 15.49 kg/m.   General exam: Pleasant, elderly African-American female. Skin: No rashes, lesions or ulcers. HEENT: Atraumatic, normocephalic, no obvious bleeding Lungs: Clear to auscultation bilaterally CVS: S1, S2, no murmur GI/Abd: Soft, nontender, nondistended, bowel sound present CNS: Alert, awake, oriented x 3 Psychiatry: Mood appropriate Extremities: No pedal edema, no calf tenderness  Follow ups:    Follow-up Information     Kathryne Hitch, MD. Schedule an appointment as soon as possible for a visit in 2 week(s).   Specialty: Orthopedic Surgery Contact information: [address] [place] [address] [phone]         Irena Reichmann, DO Follow up.   Specialty: Family Medicine Contact information: [address] [place] [address] [place] [address] [phone]                 Discharge Instructions:   Discharge Instructions     Call MD for:  difficulty breathing, headache or visual disturbances   Complete by: As directed    Call MD for:  extreme fatigue   Complete by: As directed    Call MD for:  hives   Complete by: As directed    Call MD for:  persistant dizziness or light-headedness   Complete by: As directed    Call MD for:  persistant nausea and vomiting   Complete by: As directed    Call MD for:  severe uncontrolled pain   Complete by: As directed    Call MD for:  temperature >100.4   Complete by: As directed    Diet general   Complete by: As directed    Increase activity slowly   Complete by: As directed        Discharge Medications:   Allergies as of 12/30/2022       Reactions   Sulfa Antibiotics    Other reaction(s): Other (See Comments) Other Reaction(s): Other (See Comments)   Sulfonamide Derivatives Hives   Methocarbamol    Other reaction(s): nightmares   Pancrelipase (lip-prot-amyl)    Other  reaction(s): indigestion   Sulfacetamide Sodium-sulfur    Other reaction(s): swelling        Medication List     PAUSE taking these medications    amLODipine 5 MG tablet Wait to take this until your doctor or other care provider tells you to start again. Commonly known as: NORVASC Take 5 mg by mouth 2 (two) times daily.   losartan 50 MG tablet Wait to take this until your doctor or other care provider tells you to start again. Commonly known as: COZAAR Take 50 mg by mouth in the morning and at bedtime.       STOP taking these medications    Baclofen 5 MG Tabs   HYDROcodone-acetaminophen 5-325 MG tablet Commonly known as: NORCO/VICODIN       TAKE these medications  acetaminophen 500 MG tablet Commonly known as: TYLENOL Take 2 tablets (1,000 mg total) by mouth every 8 (eight) hours.   aspirin EC 81 MG tablet Take 1 tablet (81 mg total) by mouth 2 (two) times daily for 21 days. Swallow whole.   Boostrix 5-2.5-18.5 LF-MCG/0.5 injection Generic drug: Tdap   cetirizine 5 MG tablet Commonly known as: ZYRTEC Take 5 mg by mouth daily as needed for allergies.   cholecalciferol 1000 units tablet Commonly known as: VITAMIN D Take 1,000 Units by mouth daily.   clotrimazole-betamethasone cream Commonly known as: LOTRISONE Apply topically.   cyanocobalamin 1000 MCG tablet Commonly known as: VITAMIN B12 Take 1,000 mcg by mouth daily.   vitamin B-12 100 MCG tablet Commonly known as: CYANOCOBALAMIN   diclofenac Sodium 1 % Gel Commonly known as: VOLTAREN Apply topically.   estradiol 0.1 MG/GM vaginal cream Commonly known as: ESTRACE   feeding supplement (NEPRO CARB STEADY) Liqd Take 237 mLs by mouth 2 (two) times daily between meals.   ferrous gluconate 240 (27 FE) MG tablet Commonly known as: FERGON Take 240 mg by mouth 2 (two) times daily.   hydroxychloroquine 200 MG tablet Commonly known as: PLAQUENIL Take 200 mg by mouth 2 (two) times daily.    ketoconazole 2 % cream Commonly known as: NIZORAL Apply 1 application topically daily as needed (infection treatment).   leflunomide 10 MG tablet Commonly known as: ARAVA Take 10 mg by mouth daily.   lidocaine 4 % Apply 1 patch topically daily as needed. 12 hours on, 12 hours off   multivitamin with minerals Tabs tablet Take 1 tablet by mouth daily.   omeprazole 20 MG capsule Commonly known as: PRILOSEC Take 20 mg by mouth 2 (two) times daily.   oxybutynin 5 MG tablet Commonly known as: DITROPAN Take 5 mg by mouth 2 (two) times daily.   oxyCODONE 5 MG immediate release tablet Commonly known as: Oxy IR/ROXICODONE Take 1 tablet (5 mg total) by mouth every 4 (four) hours as needed for moderate pain (pain score 4-6) or breakthrough pain.   Pfizer-BioNTech COVID-19 Vacc 30 MCG/0.3ML injection Generic drug: COVID-19 mRNA vaccine (Pfizer)   Phenadoz 25 MG suppository Generic drug: promethazine Place 25 mg rectally every 6 (six) hours as needed for nausea or vomiting.   polyethylene glycol 17 g packet Commonly known as: MIRALAX / GLYCOLAX Take 17 g by mouth daily as needed for mild constipation.   predniSONE 5 MG tablet Commonly known as: DELTASONE Take 5 mg by mouth daily. continuous   pregabalin 75 MG capsule Commonly known as: LYRICA Take 75 mg by mouth daily.   pyridOXINE 100 MG tablet Commonly known as: VITAMIN B6 Take 100 mg by mouth daily.   RA Probiotic Digestive Care Caps Take 1 capsule by mouth daily.   Shingrix injection Generic drug: Zoster Vaccine Adjuvanted   VISINE OP Place 2 drops into both eyes as needed (for dry eyes).               Durable Medical Equipment  (From admission, onward)           Start     Ordered   12/26/22 2033  DME 3 n 1  Once        12/26/22 2032   12/26/22 2033  DME Walker rolling  Once       Question Answer Comment  Walker: With 5 Inch Wheels   Patient needs a walker to treat with the following condition  Status post total replacement  of left hip      12/26/22 2032             The results of significant diagnostics from this hospitalization (including imaging, microbiology, ancillary and laboratory) are listed below for reference.    Procedures and Diagnostic Studies:   CT Hip Left Wo Contrast Result Date: 12/24/2022 CLINICAL DATA:  Hip trauma.  Fracture suspected.  Fall. EXAM: CT OF THE LEFT HIP WITHOUT CONTRAST TECHNIQUE: Multidetector CT imaging of the left hip was performed according to the standard protocol. Multiplanar CT image reconstructions were also generated. RADIATION DOSE REDUCTION: This exam was performed according to the departmental dose-optimization program which includes automated exposure control, adjustment of the mA and/or kV according to patient size and/or use of iterative reconstruction technique. COMPARISON:  Pelvis and left hip radiographs 12/24/2022, CT abdomen and pelvis 07/14/2016 FINDINGS: Bones/Joint/Cartilage There is a band of sclerosis throughout the superolateral aspect of the left femoral head. Broad region of sclerosis is also seen within the medial the posteromedial aspect of the left femoral head. There is an acute subcapital proximal left femoral neck fracture with mild impaction. Approximately 2 mm inferior and 3 mm anterior displacement of the distal fracture component with respect to the proximal fracture component. Mild anterior superior left acetabular degenerative osteophytosis. Moderate anterior superior left femoroacetabular joint space narrowing. Mild left sacroiliac subchondral sclerosis and vacuum phenomenon degenerative change. Ligaments Suboptimally assessed by CT. Muscles and Tendons Mild atrophy and fatty infiltration of the left gluteus minimus muscle. Soft tissues Moderate distention of the partially visualized urinary bladder. There is increased density and stranding within the posterolateral left buttock subcutaneous fat, likely from recent  fall/soft tissue contusion. Multiple peripherally calcified granulomas are seen within the left buttock subcutaneous fat. These are similar to 07/14/2016 CT. IMPRESSION: 1. Acute subcapital proximal left femoral neck fracture with mild impaction and displacement. 2. Band of sclerosis throughout the superolateral aspect of the left femoral head. Broad region of sclerosis is also seen within the medial and posteromedial aspect of the left femoral head. This is again suspicious for avascular necrosis. No overlying cortical collapse. Electronically Signed   By: Neita Garnet M.D.   On: 12/24/2022 16:58   DG Hip Unilat W or Wo Pelvis 2-3 Views Left Result Date: 12/24/2022 CLINICAL DATA:  Left hip pain status post fall EXAM: DG HIP (WITH OR WITHOUT PELVIS) 2-3V LEFT COMPARISON:  CT abdomen pelvis with contrast FINDINGS: Severe degenerative changes of the lower lumbar spine and right hip. Atypical femoral head lucency may be due to fracture or atypical osteopenia, given appearance on prior CT. However, fracture can not be completely excluded. IMPRESSION: Lucency of the left femoral head may be due to atypical pattern of osteopenia based on appearance on prior CT from 07/14/2016. However, fracture can not be conclusively excluded. Further evaluation with noncontrast left hip CT is recommended. Electronically Signed   By: Acquanetta Belling M.D.   On: 12/24/2022 13:59   CT Head Wo Contrast Result Date: 12/24/2022 CLINICAL DATA:  Ataxia, acute, traumatic.  Fall. EXAM: CT HEAD WITHOUT CONTRAST TECHNIQUE: Contiguous axial images were obtained from the base of the skull through the vertex without intravenous contrast. RADIATION DOSE REDUCTION: This exam was performed according to the departmental dose-optimization program which includes automated exposure control, adjustment of the mA and/or kV according to patient size and/or use of iterative reconstruction technique. COMPARISON:  Head CT 10/19/2017 FINDINGS: Brain: There is  no evidence of an acute infarct, intracranial hemorrhage, mass, midline shift,  or extra-axial fluid collection. Patchy to confluent hypodensities in the cerebral white matter have mildly progressed and are nonspecific but compatible with moderate to severe chronic small vessel ischemic disease. Mild cerebral atrophy is within normal limits for age. Vascular: Calcified atherosclerosis at the skull base. No hyperdense vessel. Skull: No acute fracture or suspicious osseous lesion. Sinuses/Orbits: Focal mucosal thickening or small retention cyst inferomedially in the left frontal sinus. Clear mastoid air cells. Bilateral cataract extraction. Other: None. IMPRESSION: 1. No evidence of acute intracranial abnormality. 2. Moderate to severe chronic small vessel ischemic disease. Electronically Signed   By: Sebastian Ache M.D.   On: 12/24/2022 13:22     Labs:   Basic Metabolic Panel: Recent Labs  Lab 12/24/22 1400 12/25/22 0600 12/26/22 0648 12/30/22 0537  NA 135 134* 135 136  K 4.1 4.8 4.8 4.9  CL 105 106 109 106  CO2 19* 22 18* 23  GLUCOSE 103* 68* 114* 95  BUN 22 20 25* 23  CREATININE 1.49* 1.56* 1.47* 1.06*  CALCIUM 9.8 9.2 8.7* 9.0  MG 2.1  --   --   --   PHOS 3.2  --   --   --    GFR Estimated Creatinine Clearance: 25.3 mL/min (A) (by C-G formula based on SCr of 1.06 mg/dL (H)). Liver Function Tests: Recent Labs  Lab 12/25/22 0600  ALBUMIN 3.0*   No results for input(s): "LIPASE", "AMYLASE" in the last 168 hours. No results for input(s): "AMMONIA" in the last 168 hours. Coagulation profile No results for input(s): "INR", "PROTIME" in the last 168 hours.  CBC: Recent Labs  Lab 12/24/22 1400 12/25/22 0600 12/26/22 0648 12/30/22 0537  WBC 10.2 11.3* 13.3* 10.0  NEUTROABS 8.5*  --  11.7* 6.4  HGB 11.2* 10.8* 10.1* 9.2*  HCT 35.6* 34.3* 32.7* 29.7*  MCV 88.1 88.6 89.3 88.4  PLT 211 183 174 213   Cardiac Enzymes: No results for input(s): "CKTOTAL", "CKMB", "CKMBINDEX",  "TROPONINI" in the last 168 hours. BNP: Invalid input(s): "POCBNP" CBG: No results for input(s): "GLUCAP" in the last 168 hours. D-Dimer No results for input(s): "DDIMER" in the last 72 hours. Hgb A1c No results for input(s): "HGBA1C" in the last 72 hours. Lipid Profile No results for input(s): "CHOL", "HDL", "LDLCALC", "TRIG", "CHOLHDL", "LDLDIRECT" in the last 72 hours. Thyroid function studies No results for input(s): "TSH", "T4TOTAL", "T3FREE", "THYROIDAB" in the last 72 hours.  Invalid input(s): "FREET3" Anemia work up No results for input(s): "VITAMINB12", "FOLATE", "FERRITIN", "TIBC", "IRON", "RETICCTPCT" in the last 72 hours. Microbiology Recent Results (from the past 240 hours)  Surgical pcr screen     Status: None   Collection Time: 12/25/22  5:00 AM   Specimen: Nasal Mucosa; Nasal Swab  Result Value Ref Range Status   MRSA, PCR NEGATIVE NEGATIVE Final   Staphylococcus aureus NEGATIVE NEGATIVE Final    Comment: (NOTE) The Xpert SA Assay (FDA approved for NASAL specimens in patients 19 years of age and older), is one component of a comprehensive surveillance program. It is not intended to diagnose infection nor to guide or monitor treatment. Performed at [facility], [address] [place], [place] [address]     Time coordinating discharge: 45 minutes  Signed: Satine Hausner  [facility] 12/30/2022, 11:36 AM

## 2022-12-30 NOTE — TOC Transition Note (Signed)
Transition of Care [facility]) - Discharge Note   Patient Details  Name: Holly Jimenez MRN: [id number] Date of Birth: 1945-10-29  Transition of Care [facility]) CM/SW Contact:  Erin Sons, LCSW Phone Number: 12/30/2022, 1:11 PM   Clinical Narrative:     SNF and [facility] Grist approved by [facility] SNF# [id number] PTAR# [id number]  Per MD patient ready for DC to [facility]. RN, and facility notified of DC. Discharge Summary and FL2 sent to facility. RN to call report prior to discharge [phone]). DC packet on chart. Ambulance transport requested for patient.   CSW will sign off for now as social work intervention is no longer needed. Please consult [place] again if new needs arise.   Final next level of care: Skilled Nursing Facility Barriers to Discharge: No Barriers Identified   Patient Goals and CMS Choice Patient states their goals for this hospitalization and ongoing recovery are:: Pt would like to regain independence. CMS Medicare.gov Compare Post Acute Care list provided to:: Patient Choice offered to / list presented to : Patient      Discharge Placement              Patient [facility] bed at: [facility] Patient to be transferred to facility by: ptar Name of family member notified: Pt states she will notify son Patient and family notified of of transfer: 12/30/22  Discharge Plan and Services Additional resources added to the After Visit Summary for   In-house Referral: Clinical Social Work   Post Acute Care Choice: Skilled Nursing Facility                               Social Drivers of Health (SDOH) Interventions SDOH Screenings   Food Insecurity: No Food Insecurity (12/24/2022)  Housing: Low Risk  (12/24/2022)  Transportation Needs: No Transportation Needs (12/24/2022)  Utilities: Not At Risk (12/24/2022)  Depression (PHQ2-9): Low Risk  (10/23/2022)  Tobacco Use: Medium Risk (12/26/2022)     Readmission Risk Interventions     No data to display

## 2022-12-30 NOTE — Progress Notes (Signed)
Physical Therapy Treatment Patient Details Name: Holly Jimenez MRN: [id number] DOB: 1945-04-01 Today's Date: 12/30/2022   History of Present Illness Pt is a 77 y/o female who presents 12/24/2022 S/p fall, sustaining a L hip displaced and impacted femoral neck fracture. She is now s/p L direct anterior total hip arthroplasty on 12/26/2022. PMH significant for anxiety, chronic back pain, CKD, DDD, HTN, Lupus, mixed connective tissue disease, neuropathy, Raynaud's disease, kyphoplasty, L foot surgery.    PT Comments  Patient progressing with mobility and able to ambulate a bit more in the room.  She needed help with L LE though progressing with stepping with the L leg during ambulation.  She tolerated session with min to moderate c/o pain though RN delivered oral meds just prior to session.  PT will continue to follow.  Recommend inpatient rehab (<3 hours/day) at d/c.    If plan is discharge home, recommend the following: A lot of help with walking and/or transfers;Assistance with cooking/housework;Assist for transportation;Help with stairs or ramp for entrance;A little help with bathing/dressing/bathroom   Can travel by private vehicle     Yes  Equipment Recommendations  None recommended by PT    Recommendations for Other Services       Precautions / Restrictions Precautions Precautions: Fall Precaution Comments: Direct anterior approach, no hip precautions. Restrictions Weight Bearing Restrictions Per Provider Order: Yes LLE Weight Bearing Per Provider Order: Weight bearing as tolerated     Mobility  Bed Mobility Overal bed mobility: Needs Assistance Bed Mobility: Supine to Sit     Supine to sit: Min assist, Mod assist     General bed mobility comments: initially assist for L LE only, then assisted to scoot hips    Transfers Overall transfer level: Needs assistance Equipment used: Rolling walker (2 wheels) Transfers: Sit to/from Stand Sit to Stand: Mod  assist           General transfer comment: heavy mod for lifting help, cues for hand placement    Ambulation/Gait Ambulation/Gait assistance: Min assist Gait Distance (Feet): 12 Feet Assistive device: Rolling walker (2 wheels) Gait Pattern/deviations: Step-to pattern, Antalgic, Decreased stride length       General Gait Details: walked to window then back to recliner in room cues for sequencing and walker management on turns   Stairs             Wheelchair Mobility     Tilt Bed    Modified Rankin (Stroke Patients Only)       Balance Overall balance assessment: Needs assistance   Sitting balance-Leahy Scale: Fair     Standing balance support: Bilateral upper extremity supported, During functional activity, Reliant on assistive device for balance Standing balance-Leahy Scale: Poor                              Cognition Arousal: Alert Behavior During Therapy: WFL for tasks assessed/performed Overall Cognitive Status: Within Functional Limits for tasks assessed                                          Exercises General Exercises - Lower Extremity Ankle Circles/Pumps: AROM, 10 reps, Supine, Both Quad Sets: AROM, Both, 10 reps, Supine Heel Slides: AROM, AAROM, Both, 10 reps, Supine Hip ABduction/ADduction: AROM, AAROM, 10 reps, Supine, Both    General Comments General comments (skin integrity,  edema, etc.): NT in to check vitals post ambulation and WNL      Pertinent Vitals/Pain Pain Assessment Pain Assessment: Faces Faces Pain Scale: Hurts little more Pain Location: L hip with supine to sit Pain Descriptors / Indicators: Operative site guarding, Sore, Grimacing Pain Intervention(s): Monitored during session, Repositioned    Home Living                          Prior Function            PT Goals (current goals can now be found in the care plan section) Progress towards PT goals: Progressing toward  goals    Frequency    Min 1X/week      PT Plan      Co-evaluation              AM-PAC PT "6 Clicks" Mobility   Outcome Measure  Help needed turning from your back to your side while in a flat bed without using bedrails?: A Lot Help needed moving from lying on your back to sitting on the side of a flat bed without using bedrails?: A Lot Help needed moving to and from a bed to a chair (including a wheelchair)?: A Lot Help needed standing up from a chair using your arms (e.g., wheelchair or bedside chair)?: A Lot Help needed to walk in hospital room?: Total Help needed climbing 3-5 steps with a railing? : Total 6 Click Score: 10    End of Session Equipment Utilized During Treatment: Gait belt Activity Tolerance: Patient tolerated treatment well Patient left: in chair;with call bell/phone within reach;with chair alarm set   PT Visit Diagnosis: Pain;Other abnormalities of gait and mobility (R26.89) Pain - Right/Left: Left Pain - part of body: Hip     Time: 2952-8413 PT Time Calculation (min) (ACUTE ONLY): 28 min  Charges:    $Gait Training: 8-22 mins $Therapeutic Exercise: 8-22 mins PT General Charges $$ ACUTE PT VISIT: 1 Visit                     Sheran Lawless, PT Acute Rehabilitation Services [phone] 12/30/2022    Holly Jimenez 12/30/2022, 10:10 AM

## 2022-12-30 NOTE — Care Management Important Message (Signed)
Important Message  Patient Details  Name: Holly Jimenez MRN: [id number] Date of Birth: Aug 31, 1945   Important Message Given:  Yes - Medicare IM     Dorena Bodo 12/30/2022, 12:25 PM

## 2022-12-30 NOTE — Consult Note (Signed)
[facility] Liaison Consult Note    12/30/2022  Holly Jimenez 27-May-1945 [id number]    Primary Care Provider:  Irena Reichmann, DO, with [facility] is listed to provide the transition of care follow up and for the [facility] team follow up, if returning to community.  Insurance: [employer]  Patient was reviewed for barriers to care for post hospital needs and notes patient is being recommended for rehab.  Patient was screened for hospitalization and on behalf of [facility]  Care Coordination to assess for post hospital community care needs.  Patient is being considered for a skilled nursing facility level of care for post hospital transition.   Plan:  Patient needs are to be met at a Skilled Nursing facility level of care for rehab.  For questions or referrals, please contact:  Charlesetta Shanks, RN, BSN, CCM [name]  [facility] Liaison Direct Dial: [phone] or secure chat Email: [name]

## 2023-01-03 DIAGNOSIS — R2689 Other abnormalities of gait and mobility: Secondary | ICD-10-CM | POA: Diagnosis not present

## 2023-01-03 DIAGNOSIS — S72002D Fracture of unspecified part of neck of left femur, subsequent encounter for closed fracture with routine healing: Secondary | ICD-10-CM | POA: Diagnosis not present

## 2023-01-03 DIAGNOSIS — E43 Unspecified severe protein-calorie malnutrition: Secondary | ICD-10-CM | POA: Diagnosis not present

## 2023-01-03 DIAGNOSIS — Z4789 Encounter for other orthopedic aftercare: Secondary | ICD-10-CM | POA: Diagnosis not present

## 2023-01-03 DIAGNOSIS — M6259 Muscle wasting and atrophy, not elsewhere classified, multiple sites: Secondary | ICD-10-CM | POA: Diagnosis not present

## 2023-01-06 DIAGNOSIS — R2689 Other abnormalities of gait and mobility: Secondary | ICD-10-CM | POA: Diagnosis not present

## 2023-01-06 DIAGNOSIS — Z4789 Encounter for other orthopedic aftercare: Secondary | ICD-10-CM | POA: Diagnosis not present

## 2023-01-06 DIAGNOSIS — M6259 Muscle wasting and atrophy, not elsewhere classified, multiple sites: Secondary | ICD-10-CM | POA: Diagnosis not present

## 2023-01-06 DIAGNOSIS — S72002D Fracture of unspecified part of neck of left femur, subsequent encounter for closed fracture with routine healing: Secondary | ICD-10-CM | POA: Diagnosis not present

## 2023-01-06 DIAGNOSIS — E43 Unspecified severe protein-calorie malnutrition: Secondary | ICD-10-CM | POA: Diagnosis not present

## 2023-01-15 ENCOUNTER — Ambulatory Visit (INDEPENDENT_AMBULATORY_CARE_PROVIDER_SITE_OTHER): Payer: PPO | Admitting: Physician Assistant

## 2023-01-15 ENCOUNTER — Encounter: Payer: Self-pay | Admitting: Physician Assistant

## 2023-01-15 DIAGNOSIS — Z96642 Presence of left artificial hip joint: Secondary | ICD-10-CM

## 2023-01-15 NOTE — Progress Notes (Addendum)
 HPI: [name] returns today status post left total hip arthroplasty 12/26/2022.  This procedure was performed secondary to hip fracture.  She is at [facility]  She presents today with her son.  She has some pain when standing.  She is able to walk with a cane at the facility.  She rates her pain to be 9 out of 10 pain at worst.  She is on aspirin  for DVT prophylaxis.  She is receiving oxycodone  for pain.  Review of systems see: HPI otherwise negative  Physical exam: General Well-developed well-nourished female no acute distress seated in wheelchair. Left hip: Surgical incisions well-approximated with staples no signs of infection or wound dehiscence.  No abnormal warmth.  Positive seroma attempted aspiration in the dry tap.  Left calf supple nontender dorsiflexion plantarflexion left ankle intact.   Impression: Status post left total hip arthroplasty  Plan: She will continue physical therapy weightbearing as tolerated left lower leg.  She is able to get the incision wet in shower Staples removed Steri-Strips applied.  She can discontinue aspirin  after another week she will remain on aspirin  once daily for another week and can discontinue as she was on no aspirin  prior to surgery.  Will see her back in 1 month sooner if there is any questions concerns.  She did ask about her blood pressure medicines I asked her to follow-up with her primary care physician as these were held at the hospital.

## 2023-01-22 DIAGNOSIS — N189 Chronic kidney disease, unspecified: Secondary | ICD-10-CM | POA: Diagnosis not present

## 2023-01-22 DIAGNOSIS — N2581 Secondary hyperparathyroidism of renal origin: Secondary | ICD-10-CM | POA: Diagnosis not present

## 2023-01-22 DIAGNOSIS — N184 Chronic kidney disease, stage 4 (severe): Secondary | ICD-10-CM | POA: Diagnosis not present

## 2023-01-22 DIAGNOSIS — D631 Anemia in chronic kidney disease: Secondary | ICD-10-CM | POA: Diagnosis not present

## 2023-01-22 DIAGNOSIS — E875 Hyperkalemia: Secondary | ICD-10-CM | POA: Diagnosis not present

## 2023-01-22 DIAGNOSIS — N179 Acute kidney failure, unspecified: Secondary | ICD-10-CM | POA: Diagnosis not present

## 2023-01-22 DIAGNOSIS — I129 Hypertensive chronic kidney disease with stage 1 through stage 4 chronic kidney disease, or unspecified chronic kidney disease: Secondary | ICD-10-CM | POA: Diagnosis not present

## 2023-01-23 DIAGNOSIS — Z7982 Long term (current) use of aspirin: Secondary | ICD-10-CM | POA: Diagnosis not present

## 2023-01-23 DIAGNOSIS — N1832 Chronic kidney disease, stage 3b: Secondary | ICD-10-CM | POA: Diagnosis not present

## 2023-01-23 DIAGNOSIS — Z96642 Presence of left artificial hip joint: Secondary | ICD-10-CM | POA: Diagnosis not present

## 2023-01-23 DIAGNOSIS — I1310 Hypertensive heart and chronic kidney disease without heart failure, with stage 1 through stage 4 chronic kidney disease, or unspecified chronic kidney disease: Secondary | ICD-10-CM | POA: Diagnosis not present

## 2023-01-23 DIAGNOSIS — E43 Unspecified severe protein-calorie malnutrition: Secondary | ICD-10-CM | POA: Diagnosis not present

## 2023-01-23 DIAGNOSIS — S72042D Displaced fracture of base of neck of left femur, subsequent encounter for closed fracture with routine healing: Secondary | ICD-10-CM | POA: Diagnosis not present

## 2023-01-23 DIAGNOSIS — G629 Polyneuropathy, unspecified: Secondary | ICD-10-CM | POA: Diagnosis not present

## 2023-01-23 DIAGNOSIS — G8929 Other chronic pain: Secondary | ICD-10-CM | POA: Diagnosis not present

## 2023-01-23 DIAGNOSIS — Z9181 History of falling: Secondary | ICD-10-CM | POA: Diagnosis not present

## 2023-01-23 DIAGNOSIS — E785 Hyperlipidemia, unspecified: Secondary | ICD-10-CM | POA: Diagnosis not present

## 2023-01-23 DIAGNOSIS — E46 Unspecified protein-calorie malnutrition: Secondary | ICD-10-CM | POA: Diagnosis not present

## 2023-01-23 DIAGNOSIS — I7300 Raynaud's syndrome without gangrene: Secondary | ICD-10-CM | POA: Diagnosis not present

## 2023-01-23 DIAGNOSIS — S72001D Fracture of unspecified part of neck of right femur, subsequent encounter for closed fracture with routine healing: Secondary | ICD-10-CM | POA: Diagnosis not present

## 2023-01-23 DIAGNOSIS — D508 Other iron deficiency anemias: Secondary | ICD-10-CM | POA: Diagnosis not present

## 2023-01-23 DIAGNOSIS — Z8711 Personal history of peptic ulcer disease: Secondary | ICD-10-CM | POA: Diagnosis not present

## 2023-01-23 DIAGNOSIS — F419 Anxiety disorder, unspecified: Secondary | ICD-10-CM | POA: Diagnosis not present

## 2023-01-23 DIAGNOSIS — F341 Dysthymic disorder: Secondary | ICD-10-CM | POA: Diagnosis not present

## 2023-01-23 DIAGNOSIS — Z96641 Presence of right artificial hip joint: Secondary | ICD-10-CM | POA: Diagnosis not present

## 2023-01-23 DIAGNOSIS — Z7952 Long term (current) use of systemic steroids: Secondary | ICD-10-CM | POA: Diagnosis not present

## 2023-01-23 DIAGNOSIS — M519 Unspecified thoracic, thoracolumbar and lumbosacral intervertebral disc disorder: Secondary | ICD-10-CM | POA: Diagnosis not present

## 2023-01-23 DIAGNOSIS — K219 Gastro-esophageal reflux disease without esophagitis: Secondary | ICD-10-CM | POA: Diagnosis not present

## 2023-01-23 DIAGNOSIS — Z87891 Personal history of nicotine dependence: Secondary | ICD-10-CM | POA: Diagnosis not present

## 2023-01-23 DIAGNOSIS — E559 Vitamin D deficiency, unspecified: Secondary | ICD-10-CM | POA: Diagnosis not present

## 2023-01-23 DIAGNOSIS — M329 Systemic lupus erythematosus, unspecified: Secondary | ICD-10-CM | POA: Diagnosis not present

## 2023-01-23 DIAGNOSIS — D631 Anemia in chronic kidney disease: Secondary | ICD-10-CM | POA: Diagnosis not present

## 2023-01-28 DIAGNOSIS — R630 Anorexia: Secondary | ICD-10-CM | POA: Diagnosis not present

## 2023-01-28 DIAGNOSIS — Z9289 Personal history of other medical treatment: Secondary | ICD-10-CM | POA: Diagnosis not present

## 2023-01-28 DIAGNOSIS — E46 Unspecified protein-calorie malnutrition: Secondary | ICD-10-CM | POA: Diagnosis not present

## 2023-01-29 DIAGNOSIS — N184 Chronic kidney disease, stage 4 (severe): Secondary | ICD-10-CM | POA: Diagnosis not present

## 2023-01-30 DIAGNOSIS — I1310 Hypertensive heart and chronic kidney disease without heart failure, with stage 1 through stage 4 chronic kidney disease, or unspecified chronic kidney disease: Secondary | ICD-10-CM | POA: Diagnosis not present

## 2023-01-30 DIAGNOSIS — Z96642 Presence of left artificial hip joint: Secondary | ICD-10-CM | POA: Diagnosis not present

## 2023-01-30 DIAGNOSIS — N1832 Chronic kidney disease, stage 3b: Secondary | ICD-10-CM | POA: Diagnosis not present

## 2023-02-11 ENCOUNTER — Telehealth: Payer: Self-pay

## 2023-02-11 NOTE — Telephone Encounter (Signed)
 Holly Jimenez, PT with [facility] wanted to let Dr. Vernetta know that patient had a fall the day before yesterday and fell on her right hip/lower back.  Stated no bruising, but hurts to walk or with certain movements.  Patient has an appt.to be seen on 02/12/2023.  Cb# [phone]

## 2023-02-12 ENCOUNTER — Other Ambulatory Visit (INDEPENDENT_AMBULATORY_CARE_PROVIDER_SITE_OTHER): Payer: PPO

## 2023-02-12 ENCOUNTER — Encounter: Payer: Self-pay | Admitting: Physician Assistant

## 2023-02-12 ENCOUNTER — Ambulatory Visit: Payer: PPO | Admitting: Physician Assistant

## 2023-02-12 DIAGNOSIS — M25551 Pain in right hip: Secondary | ICD-10-CM

## 2023-02-12 MED ORDER — OXYCODONE HCL 5 MG PO TABS: 5.0000 mg | ORAL_TABLET | Freq: Four times a day (QID) | ORAL | 0 refills | Status: DC | PRN

## 2023-02-12 MED ORDER — OXYCODONE HCL 5 MG PO TABS: 5.0000 mg | ORAL_TABLET | ORAL | 0 refills | Status: DC | PRN

## 2023-02-12 NOTE — Progress Notes (Signed)
 HPI: [name] returns today follow-up of her left total hip arthroplasty which was performed on 12/26/2022.  Unfortunately she had a fall this Monday when she tried to walk without her walker.  She does continue to work with therapy for gait balance and strengthening left leg.  She states she is having pain in her right hip region.  Pain is improving.  However she did start taking oxycodone  needed in case pain.   Physical exam: Bilateral hips left hip excellent range of motion.  Surgical incisions healing well no signs of infection or wound right hip diminished internal and external rotation but no significant pain.  Flexion right hip causes no pain.  Radiographs: AP pelvis and lateral view of the right hip shows no acute fractures acute findings.  Severely arthritic right hip with bone-on-bone arthritic changes.  Left total hip arthroplasty bone is well-seated.  No signs of acute injury otherwise.  Impression: Acute right hip pain Status post left total hip arthroplasty secondary to hip fracture  Plan: At this point time she will continue work with physical therapy on gait balance overall strengthening.  We reviewed radiographs of the right shoulder there is no apparent hip fracture on the right or pelvic fracture.  She will follow-up with us  at 6 months postop at that time we will obtain an AP pelvis and lateral view of the left hip.  Questions encouraged and answered at length.

## 2023-02-16 ENCOUNTER — Telehealth: Payer: Self-pay | Admitting: Orthopaedic Surgery

## 2023-02-16 ENCOUNTER — Other Ambulatory Visit: Payer: Self-pay | Admitting: Orthopaedic Surgery

## 2023-02-16 MED ORDER — OXYCODONE HCL 5 MG PO TABS: 5.0000 mg | ORAL_TABLET | Freq: Four times a day (QID) | ORAL | 0 refills | Status: DC | PRN

## 2023-02-16 NOTE — Telephone Encounter (Signed)
 Pt called requesting for her oxycodone  to be sent to [facility] on [address]. Pt states her normal pharmacy is out of stock. Please call pt when sent to [facility]. Pt phone number is [phone].

## 2023-02-18 DIAGNOSIS — S72041A Displaced fracture of base of neck of right femur, initial encounter for closed fracture: Secondary | ICD-10-CM | POA: Diagnosis not present

## 2023-02-18 DIAGNOSIS — S72001A Fracture of unspecified part of neck of right femur, initial encounter for closed fracture: Secondary | ICD-10-CM | POA: Diagnosis not present

## 2023-02-19 ENCOUNTER — Inpatient Hospital Stay (HOSPITAL_COMMUNITY)
Admission: EM | Admit: 2023-02-19 | Discharge: 2023-02-25 | DRG: 522 | Disposition: A | Discharge status: Skilled Nursing Facility | Payer: PPO | Attending: Internal Medicine | Admitting: Internal Medicine

## 2023-02-19 ENCOUNTER — Encounter (HOSPITAL_COMMUNITY)
Admission: EM | Disposition: A | Discharge status: Skilled Nursing Facility | Payer: Self-pay | Source: Home / Self Care | Attending: Internal Medicine

## 2023-02-19 ENCOUNTER — Inpatient Hospital Stay (HOSPITAL_COMMUNITY): Payer: PPO | Admitting: Anesthesiology

## 2023-02-19 ENCOUNTER — Encounter (HOSPITAL_COMMUNITY): Payer: Self-pay | Admitting: Emergency Medicine

## 2023-02-19 ENCOUNTER — Other Ambulatory Visit: Payer: Self-pay

## 2023-02-19 ENCOUNTER — Inpatient Hospital Stay (HOSPITAL_COMMUNITY): Payer: PPO

## 2023-02-19 ENCOUNTER — Emergency Department (HOSPITAL_COMMUNITY): Payer: PPO

## 2023-02-19 DIAGNOSIS — I7300 Raynaud's syndrome without gangrene: Secondary | ICD-10-CM | POA: Diagnosis present

## 2023-02-19 DIAGNOSIS — G8929 Other chronic pain: Secondary | ICD-10-CM | POA: Diagnosis present

## 2023-02-19 DIAGNOSIS — S72041A Displaced fracture of base of neck of right femur, initial encounter for closed fracture: Secondary | ICD-10-CM

## 2023-02-19 DIAGNOSIS — E559 Vitamin D deficiency, unspecified: Secondary | ICD-10-CM | POA: Diagnosis not present

## 2023-02-19 DIAGNOSIS — N179 Acute kidney failure, unspecified: Secondary | ICD-10-CM

## 2023-02-19 DIAGNOSIS — I129 Hypertensive chronic kidney disease with stage 1 through stage 4 chronic kidney disease, or unspecified chronic kidney disease: Secondary | ICD-10-CM | POA: Diagnosis present

## 2023-02-19 DIAGNOSIS — Z471 Aftercare following joint replacement surgery: Secondary | ICD-10-CM | POA: Diagnosis not present

## 2023-02-19 DIAGNOSIS — W19XXXA Unspecified fall, initial encounter: Secondary | ICD-10-CM | POA: Diagnosis not present

## 2023-02-19 DIAGNOSIS — Z79899 Other long term (current) drug therapy: Secondary | ICD-10-CM

## 2023-02-19 DIAGNOSIS — D631 Anemia in chronic kidney disease: Secondary | ICD-10-CM | POA: Diagnosis not present

## 2023-02-19 DIAGNOSIS — Y92009 Unspecified place in unspecified non-institutional (private) residence as the place of occurrence of the external cause: Secondary | ICD-10-CM

## 2023-02-19 DIAGNOSIS — R2689 Other abnormalities of gait and mobility: Secondary | ICD-10-CM | POA: Diagnosis not present

## 2023-02-19 DIAGNOSIS — Z833 Family history of diabetes mellitus: Secondary | ICD-10-CM | POA: Diagnosis not present

## 2023-02-19 DIAGNOSIS — R636 Underweight: Secondary | ICD-10-CM | POA: Diagnosis not present

## 2023-02-19 DIAGNOSIS — M25551 Pain in right hip: Secondary | ICD-10-CM | POA: Diagnosis present

## 2023-02-19 DIAGNOSIS — Z888 Allergy status to other drugs, medicaments and biological substances status: Secondary | ICD-10-CM

## 2023-02-19 DIAGNOSIS — Z8249 Family history of ischemic heart disease and other diseases of the circulatory system: Secondary | ICD-10-CM

## 2023-02-19 DIAGNOSIS — K219 Gastro-esophageal reflux disease without esophagitis: Secondary | ICD-10-CM | POA: Diagnosis present

## 2023-02-19 DIAGNOSIS — Z800 Family history of malignant neoplasm of digestive organs: Secondary | ICD-10-CM

## 2023-02-19 DIAGNOSIS — Z825 Family history of asthma and other chronic lower respiratory diseases: Secondary | ICD-10-CM

## 2023-02-19 DIAGNOSIS — Z90710 Acquired absence of both cervix and uterus: Secondary | ICD-10-CM

## 2023-02-19 DIAGNOSIS — R0689 Other abnormalities of breathing: Secondary | ICD-10-CM | POA: Diagnosis not present

## 2023-02-19 DIAGNOSIS — R509 Fever, unspecified: Secondary | ICD-10-CM | POA: Diagnosis not present

## 2023-02-19 DIAGNOSIS — E785 Hyperlipidemia, unspecified: Secondary | ICD-10-CM | POA: Diagnosis not present

## 2023-02-19 DIAGNOSIS — Z882 Allergy status to sulfonamides status: Secondary | ICD-10-CM

## 2023-02-19 DIAGNOSIS — N1832 Chronic kidney disease, stage 3b: Secondary | ICD-10-CM | POA: Diagnosis not present

## 2023-02-19 DIAGNOSIS — S72001A Fracture of unspecified part of neck of right femur, initial encounter for closed fracture: Secondary | ICD-10-CM

## 2023-02-19 DIAGNOSIS — M25559 Pain in unspecified hip: Secondary | ICD-10-CM | POA: Diagnosis not present

## 2023-02-19 DIAGNOSIS — G629 Polyneuropathy, unspecified: Secondary | ICD-10-CM | POA: Diagnosis not present

## 2023-02-19 DIAGNOSIS — Z96643 Presence of artificial hip joint, bilateral: Secondary | ICD-10-CM | POA: Diagnosis not present

## 2023-02-19 DIAGNOSIS — W000XXA Fall on same level due to ice and snow, initial encounter: Secondary | ICD-10-CM | POA: Diagnosis present

## 2023-02-19 DIAGNOSIS — F419 Anxiety disorder, unspecified: Secondary | ICD-10-CM | POA: Diagnosis present

## 2023-02-19 DIAGNOSIS — T40415A Adverse effect of fentanyl or fentanyl analogs, initial encounter: Secondary | ICD-10-CM | POA: Diagnosis not present

## 2023-02-19 DIAGNOSIS — Z7982 Long term (current) use of aspirin: Secondary | ICD-10-CM

## 2023-02-19 DIAGNOSIS — M1611 Unilateral primary osteoarthritis, right hip: Secondary | ICD-10-CM | POA: Diagnosis not present

## 2023-02-19 DIAGNOSIS — Z7952 Long term (current) use of systemic steroids: Secondary | ICD-10-CM

## 2023-02-19 DIAGNOSIS — S72009A Fracture of unspecified part of neck of unspecified femur, initial encounter for closed fracture: Secondary | ICD-10-CM | POA: Diagnosis present

## 2023-02-19 DIAGNOSIS — Z96642 Presence of left artificial hip joint: Secondary | ICD-10-CM | POA: Diagnosis not present

## 2023-02-19 DIAGNOSIS — Z8041 Family history of malignant neoplasm of ovary: Secondary | ICD-10-CM

## 2023-02-19 DIAGNOSIS — M6281 Muscle weakness (generalized): Secondary | ICD-10-CM | POA: Diagnosis not present

## 2023-02-19 DIAGNOSIS — I10 Essential (primary) hypertension: Secondary | ICD-10-CM | POA: Diagnosis not present

## 2023-02-19 DIAGNOSIS — I700 Atherosclerosis of aorta: Secondary | ICD-10-CM | POA: Diagnosis not present

## 2023-02-19 DIAGNOSIS — Z0389 Encounter for observation for other suspected diseases and conditions ruled out: Secondary | ICD-10-CM | POA: Diagnosis not present

## 2023-02-19 DIAGNOSIS — Z841 Family history of disorders of kidney and ureter: Secondary | ICD-10-CM

## 2023-02-19 DIAGNOSIS — R0902 Hypoxemia: Secondary | ICD-10-CM | POA: Diagnosis not present

## 2023-02-19 DIAGNOSIS — R918 Other nonspecific abnormal finding of lung field: Secondary | ICD-10-CM | POA: Diagnosis not present

## 2023-02-19 DIAGNOSIS — Z681 Body mass index (BMI) 19 or less, adult: Secondary | ICD-10-CM | POA: Diagnosis not present

## 2023-02-19 DIAGNOSIS — Z87891 Personal history of nicotine dependence: Secondary | ICD-10-CM | POA: Diagnosis not present

## 2023-02-19 DIAGNOSIS — D62 Acute posthemorrhagic anemia: Secondary | ICD-10-CM | POA: Diagnosis not present

## 2023-02-19 DIAGNOSIS — S7291XD Unspecified fracture of right femur, subsequent encounter for closed fracture with routine healing: Secondary | ICD-10-CM | POA: Diagnosis not present

## 2023-02-19 DIAGNOSIS — Z8711 Personal history of peptic ulcer disease: Secondary | ICD-10-CM

## 2023-02-19 HISTORY — PX: TOTAL HIP ARTHROPLASTY: SHX124

## 2023-02-19 LAB — BASIC METABOLIC PANEL
Anion gap: 11 (ref 5–15)
Anion gap: 12 (ref 5–15)
BUN: 29 mg/dL — ABNORMAL HIGH (ref 8–23)
BUN: 27 mg/dL — ABNORMAL HIGH (ref 8–23)
CO2: 20 mmol/L — ABNORMAL LOW (ref 22–32)
CO2: 20 mmol/L — ABNORMAL LOW (ref 22–32)
Calcium: 9.2 mg/dL (ref 8.9–10.3)
Calcium: 9.2 mg/dL (ref 8.9–10.3)
Chloride: 107 mmol/L (ref 98–111)
Chloride: 106 mmol/L (ref 98–111)
Creatinine, Ser: 1.78 mg/dL — ABNORMAL HIGH (ref 0.44–1.00)
Creatinine, Ser: 1.57 mg/dL — ABNORMAL HIGH (ref 0.44–1.00)
GFR, Estimated: 29 mL/min — ABNORMAL LOW (ref >60)
GFR, Estimated: 33 mL/min — ABNORMAL LOW (ref >60)
Glucose, Bld: 93 mg/dL (ref 70–99)
Glucose, Bld: 89 mg/dL (ref 70–99)
Potassium: 3.8 mmol/L (ref 3.5–5.1)
Potassium: 3.8 mmol/L (ref 3.5–5.1)
Sodium: 138 mmol/L (ref 135–145)
Sodium: 138 mmol/L (ref 135–145)

## 2023-02-19 LAB — CBC WITH DIFFERENTIAL/PLATELET
Abs Immature Granulocytes: 0.02 10*3/uL (ref 0.00–0.07)
Basophils Absolute: 0.1 10*3/uL (ref 0.0–0.1)
Basophils Relative: 1 %
Eosinophils Absolute: 0.1 10*3/uL (ref 0.0–0.5)
Eosinophils Relative: 1 %
HCT: 31.8 % — ABNORMAL LOW (ref 36.0–46.0)
Hemoglobin: 9.5 g/dL — ABNORMAL LOW (ref 12.0–15.0)
Immature Granulocytes: 0 %
Lymphocytes Relative: 20 %
Lymphs Abs: 2.1 10*3/uL (ref 0.7–4.0)
MCH: 26.8 pg (ref 26.0–34.0)
MCHC: 29.9 g/dL — ABNORMAL LOW (ref 30.0–36.0)
MCV: 89.8 fL (ref 80.0–100.0)
Monocytes Absolute: 0.5 10*3/uL (ref 0.1–1.0)
Monocytes Relative: 5 %
Neutro Abs: 7.9 10*3/uL — ABNORMAL HIGH (ref 1.7–7.7)
Neutrophils Relative %: 73 %
Platelets: 285 10*3/uL (ref 150–400)
RBC: 3.54 MIL/uL — ABNORMAL LOW (ref 3.87–5.11)
RDW: 13.5 % (ref 11.5–15.5)
WBC: 10.6 10*3/uL — ABNORMAL HIGH (ref 4.0–10.5)
nRBC: 0 % (ref 0.0–0.2)

## 2023-02-19 LAB — CBC
HCT: 32.2 % — ABNORMAL LOW (ref 36.0–46.0)
Hemoglobin: 9.6 g/dL — ABNORMAL LOW (ref 12.0–15.0)
MCH: 26.9 pg (ref 26.0–34.0)
MCHC: 29.8 g/dL — ABNORMAL LOW (ref 30.0–36.0)
MCV: 90.2 fL (ref 80.0–100.0)
Platelets: 277 10*3/uL (ref 150–400)
RBC: 3.57 MIL/uL — ABNORMAL LOW (ref 3.87–5.11)
RDW: 13.4 % (ref 11.5–15.5)
WBC: 9.8 10*3/uL (ref 4.0–10.5)
nRBC: 0 % (ref 0.0–0.2)

## 2023-02-19 LAB — SURGICAL PCR SCREEN
MRSA, PCR: NEGATIVE
Staphylococcus aureus: NEGATIVE

## 2023-02-19 LAB — TYPE AND SCREEN
ABO/RH(D): A POS
Antibody Screen: NEGATIVE

## 2023-02-19 SURGERY — ARTHROPLASTY, HIP, TOTAL, ANTERIOR APPROACH
Anesthesia: Spinal | Site: Hip | Laterality: Right

## 2023-02-19 MED ORDER — NEPRO/CARBSTEADY PO LIQD: 237.0000 mL | Freq: Three times a day (TID) | ORAL | Status: DC

## 2023-02-19 MED ORDER — DIPHENHYDRAMINE HCL 12.5 MG/5ML PO ELIX: 12.5000 mg | ORAL_SOLUTION | ORAL | Status: DC | PRN

## 2023-02-19 MED ORDER — DOCUSATE SODIUM 100 MG PO CAPS: 100.0000 mg | ORAL_CAPSULE | Freq: Two times a day (BID) | ORAL | Status: DC

## 2023-02-19 MED ORDER — ADULT MULTIVITAMIN W/MINERALS CH
1.0000 | ORAL_TABLET | Freq: Every day | ORAL | Status: DC
  Administered 2023-02-19: 1
  Filled 2023-02-19: qty 1

## 2023-02-19 MED ORDER — BUPIVACAINE IN DEXTROSE 0.75-8.25 % IT SOLN
INTRATHECAL | Status: DC | PRN
  Administered 2023-02-19: 1.6 mL via INTRATHECAL

## 2023-02-19 MED ORDER — ONDANSETRON HCL 4 MG/2ML IJ SOLN: 4.0000 mg | Freq: Four times a day (QID) | INTRAMUSCULAR | Status: DC | PRN

## 2023-02-19 MED ORDER — SODIUM CHLORIDE 0.9 % IV SOLN: INTRAVENOUS | Status: DC

## 2023-02-19 MED ORDER — LACTATED RINGERS IV BOLUS
1000.0000 mL | Freq: Once | INTRAVENOUS | Status: AC
  Administered 2023-02-19: 1000 mL via INTRAVENOUS

## 2023-02-19 MED ORDER — ACETAMINOPHEN 325 MG PO TABS: 325.0000 mg | ORAL_TABLET | Freq: Four times a day (QID) | ORAL | Status: DC | PRN

## 2023-02-19 MED ORDER — LIDOCAINE 2% (20 MG/ML) 5 ML SYRINGE
INTRAMUSCULAR | Status: AC
  Filled 2023-02-19: qty 5

## 2023-02-19 MED ORDER — FENTANYL CITRATE PF 50 MCG/ML IJ SOSY
50.0000 ug | PREFILLED_SYRINGE | Freq: Once | INTRAMUSCULAR | Status: AC
  Administered 2023-02-19: 50 ug via INTRAVENOUS
  Filled 2023-02-19: qty 1

## 2023-02-19 MED ORDER — HYDRALAZINE HCL 20 MG/ML IJ SOLN: 10.0000 mg | Freq: Four times a day (QID) | INTRAMUSCULAR | Status: DC | PRN

## 2023-02-19 MED ORDER — LIDOCAINE 2% (20 MG/ML) 5 ML SYRINGE
INTRAMUSCULAR | Status: DC | PRN
  Administered 2023-02-19: 40 mg via INTRAVENOUS

## 2023-02-19 MED ORDER — TRANEXAMIC ACID-NACL 1000-0.7 MG/100ML-% IV SOLN
1000.0000 mg | INTRAVENOUS | Status: AC
  Administered 2023-02-19: 1000 mg via INTRAVENOUS
  Filled 2023-02-19: qty 100

## 2023-02-19 MED ORDER — HYDROCODONE-ACETAMINOPHEN 5-325 MG PO TABS: 1.0000 | ORAL_TABLET | Freq: Four times a day (QID) | ORAL | Status: DC | PRN

## 2023-02-19 MED ORDER — LACTATED RINGERS IV SOLN: Freq: Once | INTRAVENOUS | Status: AC

## 2023-02-19 MED ORDER — ALUM & MAG HYDROXIDE-SIMETH 200-200-20 MG/5ML PO SUSP: 30.0000 mL | ORAL | Status: DC | PRN

## 2023-02-19 MED ORDER — ENSURE ENLIVE PO LIQD
237.0000 mL | Freq: Three times a day (TID) | ORAL | Status: DC
  Administered 2023-02-19: 237 mL via ORAL

## 2023-02-19 MED ORDER — DOCUSATE SODIUM 100 MG PO CAPS
100.0000 mg | ORAL_CAPSULE | Freq: Two times a day (BID) | ORAL | Status: DC
  Administered 2023-02-19 – 2023-02-25 (×12): 100 mg via ORAL
  Filled 2023-02-19 (×12): qty 1

## 2023-02-19 MED ORDER — CHLORHEXIDINE GLUCONATE 0.12 % MT SOLN: 15.0000 mL | OROMUCOSAL | Status: AC

## 2023-02-19 MED ORDER — FENTANYL CITRATE (PF) 250 MCG/5ML IJ SOLN
INTRAMUSCULAR | Status: AC
  Filled 2023-02-19: qty 5

## 2023-02-19 MED ORDER — PANTOPRAZOLE SODIUM 40 MG PO TBEC: 40.0000 mg | DELAYED_RELEASE_TABLET | Freq: Every day | ORAL | Status: DC

## 2023-02-19 MED ORDER — PROPOFOL 10 MG/ML IV BOLUS
INTRAVENOUS | Status: DC | PRN
  Administered 2023-02-19: 20 mg via INTRAVENOUS
  Administered 2023-02-19: 30 mg via INTRAVENOUS
  Administered 2023-02-19: 20 mg via INTRAVENOUS

## 2023-02-19 MED ORDER — PHENOL 1.4 % MT LIQD: 1.0000 | OROMUCOSAL | Status: DC | PRN

## 2023-02-19 MED ORDER — MENTHOL 3 MG MT LOZG: 1.0000 | LOZENGE | OROMUCOSAL | Status: DC | PRN

## 2023-02-19 MED ORDER — POLYETHYLENE GLYCOL 3350 17 G PO PACK
17.0000 g | PACK | Freq: Every day | ORAL | Status: DC | PRN
  Administered 2023-02-20 – 2023-02-25 (×3): 17 g via ORAL
  Filled 2023-02-19 (×2): qty 1

## 2023-02-19 MED ORDER — LACTATED RINGERS IV SOLN: INTRAVENOUS | Status: DC | PRN

## 2023-02-19 MED ORDER — PANTOPRAZOLE SODIUM 40 MG PO TBEC
40.0000 mg | DELAYED_RELEASE_TABLET | Freq: Every day | ORAL | Status: DC
  Administered 2023-02-20 – 2023-02-25 (×6): 40 mg via ORAL
  Filled 2023-02-19 (×7): qty 1

## 2023-02-19 MED ORDER — MORPHINE SULFATE (PF) 2 MG/ML IV SOLN
0.5000 mg | INTRAVENOUS | Status: DC | PRN
  Administered 2023-02-19: 1 mg via INTRAVENOUS
  Filled 2023-02-19: qty 1

## 2023-02-19 MED ORDER — DEXAMETHASONE SODIUM PHOSPHATE 10 MG/ML IJ SOLN
INTRAMUSCULAR | Status: AC
  Filled 2023-02-19: qty 1

## 2023-02-19 MED ORDER — DEXMEDETOMIDINE HCL IN NACL 80 MCG/20ML IV SOLN
INTRAVENOUS | Status: DC | PRN
  Administered 2023-02-19 (×2): 4 ug via INTRAVENOUS

## 2023-02-19 MED ORDER — OXYBUTYNIN CHLORIDE 5 MG PO TABS
5.0000 mg | ORAL_TABLET | Freq: Two times a day (BID) | ORAL | Status: DC
  Administered 2023-02-19 – 2023-02-25 (×12): 5 mg via ORAL
  Filled 2023-02-19 (×12): qty 1

## 2023-02-19 MED ORDER — FENTANYL CITRATE (PF) 100 MCG/2ML IJ SOLN: 25.0000 ug | INTRAMUSCULAR | Status: DC | PRN

## 2023-02-19 MED ORDER — CHLORHEXIDINE GLUCONATE 0.12 % MT SOLN
OROMUCOSAL | Status: AC
Start: 2023-02-19 — End: 2023-02-19
  Administered 2023-02-19: 15 mL via OROMUCOSAL
  Filled 2023-02-19: qty 15

## 2023-02-19 MED ORDER — HYDROCODONE-ACETAMINOPHEN 7.5-325 MG PO TABS
1.0000 | ORAL_TABLET | ORAL | Status: DC | PRN
  Administered 2023-02-19 – 2023-02-24 (×4): 2 via ORAL
  Filled 2023-02-19 (×4): qty 2

## 2023-02-19 MED ORDER — ONDANSETRON HCL 4 MG PO TABS: 4.0000 mg | ORAL_TABLET | Freq: Four times a day (QID) | ORAL | Status: DC | PRN

## 2023-02-19 MED ORDER — DEXAMETHASONE SODIUM PHOSPHATE 10 MG/ML IJ SOLN
INTRAMUSCULAR | Status: DC | PRN
  Administered 2023-02-19: 4 mg via INTRAVENOUS

## 2023-02-19 MED ORDER — METOCLOPRAMIDE HCL 5 MG/ML IJ SOLN: 5.0000 mg | Freq: Three times a day (TID) | INTRAMUSCULAR | Status: DC | PRN

## 2023-02-19 MED ORDER — 0.9 % SODIUM CHLORIDE (POUR BTL) OPTIME
TOPICAL | Status: DC | PRN
  Administered 2023-02-19: 1000 mL

## 2023-02-19 MED ORDER — ASPIRIN 81 MG PO CHEW
81.0000 mg | CHEWABLE_TABLET | Freq: Two times a day (BID) | ORAL | Status: DC
  Administered 2023-02-19 – 2023-02-25 (×12): 81 mg via ORAL
  Filled 2023-02-19 (×12): qty 1

## 2023-02-19 MED ORDER — MORPHINE SULFATE (PF) 2 MG/ML IV SOLN
0.5000 mg | INTRAVENOUS | Status: DC | PRN
  Administered 2023-02-19: 0.5 mg via INTRAVENOUS
  Filled 2023-02-19: qty 1

## 2023-02-19 MED ORDER — ONDANSETRON HCL 4 MG/2ML IJ SOLN
INTRAMUSCULAR | Status: DC | PRN
  Administered 2023-02-19: 4 mg via INTRAVENOUS

## 2023-02-19 MED ORDER — CEFAZOLIN SODIUM-DEXTROSE 2-4 GM/100ML-% IV SOLN
2.0000 g | INTRAVENOUS | Status: AC
  Administered 2023-02-19: 2 g via INTRAVENOUS
  Filled 2023-02-19: qty 100

## 2023-02-19 MED ORDER — MIDAZOLAM HCL 2 MG/2ML IJ SOLN
INTRAMUSCULAR | Status: AC
  Filled 2023-02-19: qty 2

## 2023-02-19 MED ORDER — METOCLOPRAMIDE HCL 5 MG PO TABS: 5.0000 mg | ORAL_TABLET | Freq: Three times a day (TID) | ORAL | Status: DC | PRN

## 2023-02-19 MED ORDER — HYDROCODONE-ACETAMINOPHEN 5-325 MG PO TABS
1.0000 | ORAL_TABLET | ORAL | Status: DC | PRN
  Administered 2023-02-20 – 2023-02-23 (×5): 2 via ORAL
  Administered 2023-02-23: 1 via ORAL
  Administered 2023-02-23 – 2023-02-25 (×4): 2 via ORAL
  Filled 2023-02-19: qty 2
  Filled 2023-02-19: qty 1
  Filled 2023-02-19 (×8): qty 2

## 2023-02-19 MED ORDER — CEFAZOLIN SODIUM-DEXTROSE 2-4 GM/100ML-% IV SOLN
2.0000 g | Freq: Four times a day (QID) | INTRAVENOUS | Status: AC
  Administered 2023-02-19 (×2): 2 g via INTRAVENOUS
  Filled 2023-02-19 (×2): qty 100

## 2023-02-19 MED ORDER — HYDROMORPHONE HCL 1 MG/ML IJ SOLN
0.2500 mg | INTRAMUSCULAR | Status: DC | PRN
  Administered 2023-02-19 – 2023-02-21 (×5): 0.25 mg via INTRAVENOUS
  Filled 2023-02-19 (×5): qty 0.5

## 2023-02-19 MED ORDER — PROPOFOL 10 MG/ML IV BOLUS
INTRAVENOUS | Status: AC
Start: 2023-02-19 — End: ?
  Filled 2023-02-19: qty 20

## 2023-02-19 MED ORDER — PROPOFOL 1000 MG/100ML IV EMUL
INTRAVENOUS | Status: AC
  Filled 2023-02-19: qty 100

## 2023-02-19 MED ORDER — SODIUM CHLORIDE 0.9 % IR SOLN
Status: DC | PRN
  Administered 2023-02-19: 3000 mL

## 2023-02-19 MED ORDER — PROPOFOL 500 MG/50ML IV EMUL
INTRAVENOUS | Status: DC | PRN
  Administered 2023-02-19: 50 ug/kg/min via INTRAVENOUS

## 2023-02-19 MED ORDER — ONDANSETRON HCL 4 MG/2ML IJ SOLN
INTRAMUSCULAR | Status: AC
Start: 2023-02-19 — End: ?
  Filled 2023-02-19: qty 2

## 2023-02-19 SURGICAL SUPPLY — 47 items
BAG COUNTER SPONGE SURGICOUNT (BAG) ×1 IMPLANT
BENZOIN TINCTURE PRP APPL 2/3 (GAUZE/BANDAGES/DRESSINGS) ×1 IMPLANT
BLADE CLIPPER SURG (BLADE) IMPLANT
BLADE SAW SGTL 18X1.27X75 (BLADE) ×1 IMPLANT
COVER SURGICAL LIGHT HANDLE (MISCELLANEOUS) ×1 IMPLANT
CUP SECTOR GRIPTON 50MM (Cup) IMPLANT
DRAPE C-ARM 42X72 X-RAY (DRAPES) ×1 IMPLANT
DRAPE STERI IOBAN 125X83 (DRAPES) ×1 IMPLANT
DRAPE U-SHAPE 47X51 STRL (DRAPES) ×3 IMPLANT
DRSG AQUACEL AG ADV 3.5X 4 (GAUZE/BANDAGES/DRESSINGS) IMPLANT
DRSG AQUACEL AG ADV 3.5X10 (GAUZE/BANDAGES/DRESSINGS) ×1 IMPLANT
DURAPREP 26ML APPLICATOR (WOUND CARE) ×1 IMPLANT
ELECT BLADE 4.0 EZ CLEAN MEGAD (MISCELLANEOUS) ×1 IMPLANT
ELECT BLADE 6.5 EXT (BLADE) IMPLANT
ELECT REM PT RETURN 9FT ADLT (ELECTROSURGICAL) ×1 IMPLANT
ELECTRODE BLDE 4.0 EZ CLN MEGD (MISCELLANEOUS) ×1 IMPLANT
ELECTRODE REM PT RTRN 9FT ADLT (ELECTROSURGICAL) ×1 IMPLANT
FACESHIELD WRAPAROUND (MASK) ×2 IMPLANT
FACESHIELD WRAPAROUND OR TEAM (MASK) ×2 IMPLANT
GLOVE BIOGEL PI IND STRL 8 (GLOVE) ×2 IMPLANT
GLOVE ECLIPSE 8.0 STRL XLNG CF (GLOVE) ×1 IMPLANT
GLOVE ORTHO TXT STRL SZ7.5 (GLOVE) ×2 IMPLANT
GOWN STRL REUS W/ TWL LRG LVL3 (GOWN DISPOSABLE) ×2 IMPLANT
GOWN STRL REUS W/ TWL XL LVL3 (GOWN DISPOSABLE) ×2 IMPLANT
HEAD FEM STD 32X+1 STRL (Hips) IMPLANT
KIT BASIN OR (CUSTOM PROCEDURE TRAY) ×1 IMPLANT
KIT TURNOVER KIT B (KITS) ×1 IMPLANT
LINER ACET PNNCL PLUS4 NEUTRAL (Hips) IMPLANT
MANIFOLD NEPTUNE II (INSTRUMENTS) ×1 IMPLANT
NS IRRIG 1000ML POUR BTL (IV SOLUTION) ×1 IMPLANT
PACK TOTAL JOINT (CUSTOM PROCEDURE TRAY) ×1 IMPLANT
PAD ARMBOARD 7.5X6 YLW CONV (MISCELLANEOUS) ×1 IMPLANT
PINNACLE PLUS 4 NEUTRAL (Hips) ×1 IMPLANT
SET HNDPC FAN SPRY TIP SCT (DISPOSABLE) ×1 IMPLANT
STAPLER VISISTAT 35W (STAPLE) IMPLANT
STEM FEM SZ3 STD ACTIS (Stem) IMPLANT
STRIP CLOSURE SKIN 1/2X4 (GAUZE/BANDAGES/DRESSINGS) ×2 IMPLANT
SUT ETHIBOND NAB CT1 #1 30IN (SUTURE) ×1 IMPLANT
SUT MNCRL AB 4-0 PS2 18 (SUTURE) IMPLANT
SUT VIC AB 0 CT1 27XBRD ANBCTR (SUTURE) ×1 IMPLANT
SUT VIC AB 1 CT1 27XBRD ANBCTR (SUTURE) ×1 IMPLANT
SUT VIC AB 2-0 CT1 TAPERPNT 27 (SUTURE) ×1 IMPLANT
TOWEL GREEN STERILE (TOWEL DISPOSABLE) ×1 IMPLANT
TOWEL GREEN STERILE FF (TOWEL DISPOSABLE) ×1 IMPLANT
TRAY CATH INTERMITTENT SS 16FR (CATHETERS) IMPLANT
TRAY FOLEY W/BAG SLVR 16FR ST (SET/KITS/TRAYS/PACK) IMPLANT
WATER STERILE IRR 1000ML POUR (IV SOLUTION) ×2 IMPLANT

## 2023-02-19 NOTE — Op Note (Signed)
Operative Note  Date of operation: 02/19/2023 Preoperative diagnosis: Right hip displaced femoral neck fracture initial encounter Postoperative diagnosis: Same  Procedure: Right direct anterior total hip arthroplasty  Implants: Implant Name Type Inv. Item Serial No. Manufacturer Lot No. LRB No. Used Action  CUP SECTOR GRIPTON - [id number] Cup CUP SECTOR GRIPTON  DEPUY [employer]TH[employer] [id number] Right 1 Implanted  PINNACLE PLUS 4 NEUTRAL - [id number] Hips PINNACLE PLUS 4 NEUTRAL  DEPUY ORTHOPAEDICS [id number] Right 1 Implanted  STEM FEM SZ3 STD ACTIS - [id number] Stem STEM FEM SZ3 STD ACTIS  DEPUY ORTHOPAEDICS [id number] Right 1 Implanted  HEAD FEM STD 32X+1 STRL - [id number] Hips HEAD FEM STD 32X+1 STRL  DEPUY ORTHOPAEDICS [id number] Right 1 Implanted   Surgeon: Vanita Panda. Magnus Ivan, MD Assistant: Rexene Edison, PA-C  Anesthesia: Spinal Antibiotics: IV Ancef EBL: 50 cc Complications: None  Indications: The patient is a 78 year old female who was admitted to the hospital earlier this morning after sustaining mechanical fall at home.  She was found to have a right hip femoral neck fracture that was displaced.  She was graciously admitted to the medicine service and now presents for definitive treatment of her right hip fracture.  We have recommended a right total hip arthroplasty.  We actually replaced her left hip back in December after a mechanical fall in which she sustained a left displaced hip femoral neck fracture.  She has done well but we were concerned about her balance and coordination and had recommended she only get around with assist device such as a walker.  She said this was just accidental fall when she tripped awkwardly.  She denies any loss consciousness or head injury.  Having had surgery on her left hip just a few months ago she is fully aware what surgery involves her right hip.  I did discuss the risk and benefits of the surgery with her and talk to her son about this as  well.  Procedure description: After informed consent was obtained and the appropriate right hip was marked, the patient was brought to the operating room and turned to her side on the stretcher where spinal anesthesia was obtained.  A Foley catheter already in place.  Next traction boots were placed on both her feet and she was placed supine on the Hana fracture table with a perineal post and placed in both legs and inline skeletal traction devices no traction applied.  Her right operative hip and pelvis were assessed radiographically.  The right hip was then prepped and draped with DuraPrep and sterile drapes.  A timeout was called and she identifies correct patient correct right hip.  An incision was then made just inferior and posterior to the ASIS and carried slightly obliquely down the leg.  Dissection was carried down to the tensor fascia lata muscle and the tensor fascia was then divided longitudinally to proceed with a direct interposed the hip.  Circumflex vessels were identified and cauterized.  The hip capsule identified and opened up in L-type format finding a hemarthrosis and a displaced femoral neck fracture.  A corkscrew guide was placed in the femoral head the femoral head was removed in entirety.  There was arthritic changes as well.  A freshening femoral neck cut was made just distal to the fracture of the femoral neck and proximal to the lesser trochanter.  This was made with an oscillating saw and completed with an osteotome.  Remnants of the acetabular labrum and other debris were removed and a bent Hohmann was  placed over the medial acetabular rim.  Reaming was then initiated from a size 43 reamer and stepwise increments going up to a size 49 reamer with all reamers placed under direct visualization and the last reamer also placed under direct fluoroscopy in order to obtain the depth reaming, the inclination and anteversion.  We then placed the real DePuy sector GRIPTION acetabular component  size 50 without difficulty followed by 32+4 polythene liner.  Attention was then turned to the femur.  With the right leg externally rotated to 120 degrees, extended and adducted, a Mueller retractor was placed medially and a Hohmann directed was placed behind the greater trochanter.  The lateral joint capsule was released and a box cutting osteotome uses the femoral canal.  Broaching was initiated using the Actis broaching system from a size 0 going to a size 3.  This actually correlated with her other side as well when we replaced this back in December.  We trialed a standard offset femoral neck and a 32+1 trial hip ball.  The right leg was brought over and up and with traction and internal rotation reduced in the pelvis.  Even though the offset did not look the same radiographically his other side I think this is more of a radiographic anomaly.  The hip was very tight and it took some traction and rotation and dislocated the hip.  We are pleased with leg length.  We are also pleased with stability.  We then dislocated the hip and the trial components.  We placed the real Actis femoral component with standard offset size 3 and the real 32+1 metal hip ball and again reduces in the acetabulum and we are pleased with stability this as well as radiographic assessment.  The soft tissue was then irrigated with normal saline solution.  The joint capsule was closed with interrupted #1 Ethibond suture followed by #1 Vicryl close the tensor fascia.  0 Vicryl is used to close the deep tissue and 2-0 Vicryl was used to close subcutaneous tissue.  The skin was closed with staples.  An Aquacel dressing was applied.  The patient was taken off the Hana table and taken to recovery room in stable condition.  Rexene Edison, PA-C did assist during the entire case and beginning to end and his assistance was crucial and medically necessary for soft tissue management and retraction, helping guide implant placement and a layered closure of  the wound.

## 2023-02-19 NOTE — Progress Notes (Signed)
Right hip fx , s/p R total hip arthroplasty 2/13   02/19/23 1619  TOC Brief Assessment  Insurance and Status Reviewed  Patient has primary care physician Yes  Home environment has been reviewed From home with son.  Prior level of function: PTA required min assist with ADL's pt states.  Prior/Current Home Services No current home services  Social Drivers of Health Review SDOH reviewed no interventions necessary  Readmission risk has been reviewed No  Transition of care needs transition of care needs identified, TOC will continue to follow   Pt already has RW and BSC. @ home. PT/OT  evaluations pending. Pt states if SNF need presents she would be agreeable. [facility]  TOC team following and will assist with needs... Gae Gallop RN,BSNCM [phone]

## 2023-02-19 NOTE — Anesthesia Postprocedure Evaluation (Signed)
Anesthesia Post Note  Patient: Holly Jimenez  Procedure(s) Performed: TOTAL HIP ARTHROPLASTY ANTERIOR APPROACH (Right: Hip)     Patient location during evaluation: PACU Anesthesia Type: Spinal Level of consciousness: oriented and awake and alert Pain management: pain level controlled Vital Signs Assessment: post-procedure vital signs reviewed and stable Respiratory status: spontaneous breathing, respiratory function stable and patient connected to nasal cannula oxygen Cardiovascular status: blood pressure returned to baseline and stable Postop Assessment: no headache, no backache, no apparent nausea or vomiting, spinal receding and patient able to bend at knees Anesthetic complications: no  No notable events documented.  Last Vitals:  Vitals:   02/19/23 1245 02/19/23 1300  BP: (!) 122/58 116/84  Pulse: 77 81  Resp: 10 16  Temp:    SpO2: 96% 96%    Last Pain:  Vitals:   02/19/23 1245  TempSrc:   PainSc: 0-No pain                 [name],W. EDMOND

## 2023-02-19 NOTE — Consult Note (Signed)
 Reason for Consult:Right hip fx Referring Physician: Dorcas Carrow Time called: [id number] Time at bedside: 0907   Holly Jimenez is an 78 y.o. female.  HPI: Holly Jimenez slipped on some ice at home and fell yesterday. She had immediate right hip pain and could not get up. She was brought to the ED where x-rays showed a right hip fx and orthopedic surgery was consulted. She lives at home alone and ambulates with the aid of a RW.  Past Medical History:  Diagnosis Date   Abnormal LFTs    Allergic rhinitis    Anemia    Anxiety    Arthritis    Chronic back pain    Chronic headaches    Chronic kidney disease, unspecified    DDD (degenerative disc disease)    GERD (gastroesophageal reflux disease)    Headache    Hypercalcemia    Hyperkalemia    Hyperlipidemia    Hypertension    KIDNEY SPECIALIST TOOK OFF BENICAR   IBS (irritable bowel syndrome)    Lupus    Mixed connective tissue disease (HCC)    Nausea    Neuropathy    Peptic ulcer disease    Raynaud's disease    Small kidney    left   Vitamin D deficiency disease     Past Surgical History:  Procedure Laterality Date   ABDOMINAL HYSTERECTOMY  1975   APPENDECTOMY  1972   FOOT SURGERY     LEFT   KYPHOPLASTY Bilateral 03/08/2014   Procedure: Thoracic nine Kyphoplasty;  Surgeon: Coletta Memos, MD;  Location: [facility];  Service: Neurosurgery;  Laterality: Bilateral;  Thoracic nine Kyphoplasty   LIPOMA EXCISION     back   TONSILLECTOMY     TOTAL HIP ARTHROPLASTY Left 12/26/2022   Procedure: TOTAL HIP ARTHROPLASTY ANTERIOR APPROACH;  Surgeon: Kathryne Hitch, MD;  Location: [facility] OR;  Service: Orthopedics;  Laterality: Left;   WRIST GANGLION EXCISION     left    Family History  Problem Relation Age of Onset   Ovarian cancer Sister    Colon cancer Sister    Heart disease Paternal Grandfather    Heart disease Maternal Grandfather    Kidney disease Brother    Kidney disease Mother    Hypertension Mother     Kidney disease Maternal Grandmother    Diabetes Paternal Grandmother    Asthma Father    Suicidality Father    Alcoholism Father     Social History:  reports that she quit smoking about 22 years ago. Her smoking use included cigarettes. She started smoking about 47 years ago. She has a 25 pack-year smoking history. She has never used smokeless tobacco. She reports current alcohol use. She reports that she does not use drugs.  Allergies:  Allergies  Allergen Reactions   Sulfa Antibiotics     Other reaction(s): Other (See Comments)  Other Reaction(s): Other (See Comments)   Sulfonamide Derivatives Hives   Methocarbamol     Other reaction(s): nightmares   Pancrelipase (Lip-Prot-Amyl)     Other reaction(s): indigestion   Sulfacetamide Sodium-Sulfur     Other reaction(s): swelling    Medications: I have reviewed the patient's current medications.  Results for orders placed or performed during the hospital encounter of 02/19/23 (from the past 48 hours)  CBC with Differential     Status: Abnormal   Collection Time: 02/19/23  1:30 AM  Result Value Ref Range   WBC 10.6 (H) 4.0 - 10.5 K/uL   RBC  3.54 (L) 3.87 - 5.11 MIL/uL   Hemoglobin 9.5 (L) 12.0 - 15.0 g/dL   HCT 91.4 (L) 78.2 - 95.6 %   MCV 89.8 80.0 - 100.0 fL   MCH 26.8 26.0 - 34.0 pg   MCHC 29.9 (L) 30.0 - 36.0 g/dL   RDW 21.3 08.6 - 57.8 %   Platelets 285 150 - 400 K/uL   nRBC 0.0 0.0 - 0.2 %   Neutrophils Relative % 73 %   Neutro Abs 7.9 (H) 1.7 - 7.7 K/uL   Lymphocytes Relative 20 %   Lymphs Abs 2.1 0.7 - 4.0 K/uL   Monocytes Relative 5 %   Monocytes Absolute 0.5 0.1 - 1.0 K/uL   Eosinophils Relative 1 %   Eosinophils Absolute 0.1 0.0 - 0.5 K/uL   Basophils Relative 1 %   Basophils Absolute 0.1 0.0 - 0.1 K/uL   Immature Granulocytes 0 %   Abs Immature Granulocytes 0.02 0.00 - 0.07 K/uL    Comment: Performed at [facility], [address] [place], [place] [address]  Basic metabolic panel     Status:  Abnormal   Collection Time: 02/19/23  1:30 AM  Result Value Ref Range   Sodium 138 135 - 145 mmol/L   Potassium 3.8 3.5 - 5.1 mmol/L   Chloride 107 98 - 111 mmol/L   CO2 20 (L) 22 - 32 mmol/L   Glucose, Bld 93 70 - 99 mg/dL    Comment: Glucose reference range applies only to samples taken after fasting for at least 8 hours.   BUN 29 (H) 8 - 23 mg/dL   Creatinine, Ser 9.52 (H) 0.44 - 1.00 mg/dL   Calcium 9.2 8.9 - 84.1 mg/dL   GFR, Estimated 29 (L) >60 mL/min    Comment: (NOTE) Calculated using the CKD-EPI Creatinine Equation (2021)    Anion gap 11 5 - 15    Comment: Performed at [facility], [address] [place], [place] [address]  Type and screen [facility]     Status: None   Collection Time: 02/19/23  3:32 AM  Result Value Ref Range   ABO/RH(D) A POS    Antibody Screen NEG    Sample Expiration      02/22/2023,2359 Performed at [facility], [address], [place], [place] [address]   CBC     Status: Abnormal   Collection Time: 02/19/23  3:42 AM  Result Value Ref Range   WBC 9.8 4.0 - 10.5 K/uL   RBC 3.57 (L) 3.87 - 5.11 MIL/uL   Hemoglobin 9.6 (L) 12.0 - 15.0 g/dL   HCT 53.6 (L) 64.4 - 03.4 %   MCV 90.2 80.0 - 100.0 fL   MCH 26.9 26.0 - 34.0 pg   MCHC 29.8 (L) 30.0 - 36.0 g/dL   RDW 74.2 59.5 - 63.8 %   Platelets 277 150 - 400 K/uL   nRBC 0.0 0.0 - 0.2 %    Comment: Performed at [facility], [address] [place], [place] [address]  Basic metabolic panel     Status: Abnormal   Collection Time: 02/19/23  3:42 AM  Result Value Ref Range   Sodium 138 135 - 145 mmol/L   Potassium 3.8 3.5 - 5.1 mmol/L   Chloride 106 98 - 111 mmol/L   CO2 20 (L) 22 - 32 mmol/L   Glucose, Bld 89 70 - 99 mg/dL    Comment: Glucose reference range applies only  to samples taken after fasting for at least 8 hours.   BUN 27 (H) 8 - 23 mg/dL   Creatinine, Ser 1.61 (H) 0.44 - 1.00 mg/dL   Calcium 9.2 8.9 - 09.6 mg/dL   GFR, Estimated 33 (L) >60  mL/min    Comment: (NOTE) Calculated using the CKD-EPI Creatinine Equation (2021)    Anion gap 12 5 - 15    Comment: Performed at [facility], [address], [place], [place] [address]    Chest Portable 1 View Result Date: 02/19/2023 CLINICAL DATA:  Admission for hip fracture EXAM: PORTABLE CHEST 1 VIEW COMPARISON:  10/12/2013 FINDINGS: Stable cardiomediastinal silhouette. Aortic atherosclerotic calcification. Bronchial wall thickening and diffuse interstitial coarsening. No focal pneumonia. No pleural effusion or pneumothorax. No displaced rib fractures. IMPRESSION: Bronchial wall thickening and diffuse interstitial coarsening which may be due to bronchitis. Electronically Signed   By: Minerva Fester M.D.   On: 02/19/2023 03:56   DG Hip Unilat W or Wo Pelvis 1 View Right Result Date: 02/19/2023 CLINICAL DATA:  Status post fall. EXAM: DG HIP (WITH OR WITHOUT PELVIS) 1V RIGHT COMPARISON:  February 12, 2023 FINDINGS: There is attacked total left hip replacement. An acute fracture deformity is seen extending through the neck of the proximal right femur. There is no evidence of dislocation. A stable deformity of indeterminate age is seen involving the right inferior pubic ramus. Advanced degenerative changes are noted involving the right hip in the form of joint space narrowing and acetabular sclerosis. IMPRESSION: 1. Acute fracture of the proximal right femur. 2. Stable deformity of the right inferior pubic ramus of indeterminate age. 3. Advanced degenerative changes of the right hip. 4. Intact total left hip replacement. Electronically Signed   By: Aram Candela M.D.   On: 02/19/2023 01:53    Review of Systems  HENT:  Negative for ear discharge, ear pain, hearing loss and tinnitus.   Eyes:  Negative for photophobia and pain.  Respiratory:  Negative for cough and shortness of breath.   Cardiovascular:  Negative for chest pain.  Gastrointestinal:  Negative for abdominal pain, nausea and  vomiting.  Genitourinary:  Negative for dysuria, flank pain, frequency and urgency.  Musculoskeletal:  Positive for arthralgias (Right hip). Negative for back pain, myalgias and neck pain.  Neurological:  Negative for dizziness and headaches.  Hematological:  Does not bruise/bleed easily.  Psychiatric/Behavioral:  The patient is not nervous/anxious.    Blood pressure (!) 144/59, pulse 92, temperature 98.2 F (36.8 C), temperature source Oral, resp. rate 18, height 5\' 1"  (1.549 m), weight 40.8 kg, SpO2 97%. Physical Exam Constitutional:      General: She is not in acute distress.    Appearance: She is well-developed. She is not diaphoretic.  HENT:     Head: Normocephalic and atraumatic.  Eyes:     General: No scleral icterus.       Right eye: No discharge.        Left eye: No discharge.     Conjunctiva/sclera: Conjunctivae normal.  Cardiovascular:     Rate and Rhythm: Normal rate and regular rhythm.  Pulmonary:     Effort: Pulmonary effort is normal. No respiratory distress.  Musculoskeletal:     Cervical back: Normal range of motion.     Comments: RLE No traumatic wounds, ecchymosis, or rash  Mild TTP hip  No knee or ankle effusion  Knee stable to varus/ valgus and anterior/posterior stress  Sens DPN, SPN, TN intact  Motor EHL,  ext, flex, evers 5/5  DP 2+, PT 1+, No significant edema  Skin:    General: Skin is warm and dry.  Neurological:     Mental Status: She is alert.  Psychiatric:        Mood and Affect: Mood normal.        Behavior: Behavior normal.     Assessment/Plan: Right hip fx -- Plan THA today with Dr. Magnus Ivan. Please keep NPO.    Freeman Caldron, PA-C Orthopedic Surgery [phone] 02/19/2023, 9:12 AM

## 2023-02-19 NOTE — ED Triage Notes (Signed)
Patient coming from home fell at 1900 and her walker fell on top of her. Took a nap at 2100 and just woke up at 0100 tonight in severe pain. Patient took 5 mg of oxy after the fall. EMS gave 100 mcg of fent. Obvious deformity of R hip on seen with shortening of R leg. No head injury. Denies LOC.   HR- 83 BP- 132/70

## 2023-02-19 NOTE — Anesthesia Preprocedure Evaluation (Signed)
Anesthesia Evaluation  Patient identified by MRN, date of birth, ID band Patient awake    Reviewed: Allergy & Precautions, H&P , NPO status , Patient's Chart, lab work & pertinent test results  Airway Mallampati: I  TM Distance: >3 FB Neck ROM: Full    Dental no notable dental hx. (+) Edentulous Upper, Edentulous Lower, Dental Advisory Given   Pulmonary neg pulmonary ROS, former smoker   Pulmonary exam normal breath sounds clear to auscultation       Cardiovascular hypertension, Pt. on medications  Rhythm:Regular Rate:Normal     Neuro/Psych  Headaches  Anxiety        GI/Hepatic Neg liver ROS, PUD,GERD  Medicated,,  Endo/Other  negative endocrine ROS    Renal/GU Renal InsufficiencyRenal disease  negative genitourinary   Musculoskeletal  (+) Arthritis , Osteoarthritis,    Abdominal   Peds  Hematology  (+) Blood dyscrasia, anemia   Anesthesia Other Findings   Reproductive/Obstetrics negative OB ROS                             Anesthesia Physical Anesthesia Plan  ASA: 3  Anesthesia Plan: Spinal   Post-op Pain Management: Ofirmev IV (intra-op)*   Induction: Intravenous  PONV Risk Score and Plan: 3 and Ondansetron and Propofol infusion  Airway Management Planned: Natural Airway and Simple Face Mask  Additional Equipment:   Intra-op Plan:   Post-operative Plan:   Informed Consent: I have reviewed the patients History and Physical, chart, labs and discussed the procedure including the risks, benefits and alternatives for the proposed anesthesia with the patient or authorized representative who has indicated his/her understanding and acceptance.     Dental advisory given  Plan Discussed with: CRNA  Anesthesia Plan Comments:        Anesthesia Quick Evaluation

## 2023-02-19 NOTE — Anesthesia Procedure Notes (Signed)
Spinal  Patient location during procedure: OR Start time: 02/19/2023 10:15 AM End time: 02/19/2023 10:20 AM Reason for block: surgical anesthesia Staffing Performed: anesthesiologist  Anesthesiologist: Gaynelle Adu, MD Performed by: Gaynelle Adu, MD Authorized by: Gaynelle Adu, MD   Preanesthetic Checklist Completed: patient identified, IV checked, risks and benefits discussed, surgical consent, monitors and equipment checked, pre-op evaluation and timeout performed Spinal Block Patient position: left lateral decubitus Prep: DuraPrep Patient monitoring: cardiac monitor, continuous pulse ox and blood pressure Approach: left paramedian Location: L3-4 Injection technique: single-shot Needle Needle type: Quincke  Needle gauge: 22 G Needle length: 9 cm Assessment Sensory level: T8 Events: CSF return Additional Notes Functioning IV was confirmed and monitors were applied. Sterile prep and drape, including hand hygiene and sterile gloves were used. The patient was positioned and the spine was prepped. The skin was anesthetized with lidocaine.  Free flow of clear CSF was obtained prior to injecting local anesthetic into the CSF.  The spinal needle aspirated freely following injection.  The needle was carefully withdrawn.  The patient tolerated the procedure well.

## 2023-02-19 NOTE — Progress Notes (Signed)
    Durable Medical Equipment  (From admission, onward)           Start     Ordered   02/19/23 1611  DME 3 n 1  Once       Comments: Bedside commode , confine to one room   02/19/23 1611   02/19/23 1439  DME Walker rolling  Once       Question Answer Comment  Walker: With 5 Inch Wheels   Patient needs a walker to treat with the following condition Status post total replacement of right hip      02/19/23 1438

## 2023-02-19 NOTE — Progress Notes (Signed)
Orthopedic Tech Progress Note Patient Details:  Holly Jimenez 10-26-1945 [id number]  Patient ID: [name], female   DOB: Jan 24, 1945, 78 y.o.   MRN: [id number] Pt does not meet criteria for ohf. Pt must be under 70 to get ohf. Trinna Post 02/19/2023, 6:59 AM

## 2023-02-19 NOTE — Progress Notes (Signed)
Orthopedic Note  Received consult call overnight about established patient with right femoral neck fracture.  I saw the patient this morning.  She is complaining of right hip pain.  She does not have any pain elsewhere.  She has tenderness to palpation over the right thigh and hip region.  She has pain with logroll on that side.  Gross deformity with shortening on the right lower extremity when compared to the left lower extremity.  She has no tenderness to palpation over the remainder of her extremities.  EHL/TA/GSC intact bilaterally.  Feet warm and well-perfused.  AIN/PIN/IO intact in bilateral upper extremities.  No tenderness to palpation over the upper extremities.  Palpable radial pulses bilaterally.  She is a patient of Dr. Eliberto Ivory.  She is going to work to get her on the OR schedule for THA.  Further notes to follow with plan.  London Sheer, MD Orthopedic Surgeon

## 2023-02-19 NOTE — ED Notes (Signed)
ED TO INPATIENT HANDOFF REPORT  ED Nurse Name and Phone #: Trinna Post, RN   S Name/Age/Gender Holly Jimenez 78 y.o. female Room/Bed: 019C/019C  Code Status   Code Status: Full Code  Home/SNF/Other Home Patient oriented to: self, place, time, and situation Is this baseline? Yes   Triage Complete: Triage complete  Chief Complaint Hip fracture [facility]) [S72.009A]  Triage Note Patient coming from home fell at 1900 and her walker fell on top of her. Took a nap at 2100 and just woke up at 0100 tonight in severe pain. Patient took 5 mg of oxy after the fall. EMS gave 100 mcg of fent. Obvious deformity of R hip on seen with shortening of R leg. No head injury. Denies LOC.   HR- 83 BP- 132/70     Allergies Allergies  Allergen Reactions   Sulfa Antibiotics     Other reaction(s): Other (See Comments)  Other Reaction(s): Other (See Comments)   Sulfonamide Derivatives Hives   Methocarbamol     Other reaction(s): nightmares   Pancrelipase (Lip-Prot-Amyl)     Other reaction(s): indigestion   Sulfacetamide Sodium-Sulfur     Other reaction(s): swelling    Level of Care/Admitting Diagnosis ED Disposition     ED Disposition  Admit   Condition  --   Comment  Hospital Area: [facility] [[id number]]  Level of Care: Telemetry Medical [104]  May admit patient to [facility] or [facility] if equivalent level of care is available:: No  Covid Evaluation: Confirmed COVID Negative  Diagnosis: Hip fracture [facility]) [[id number]]  Admitting Physician: Lurline Del [[id number]]  Attending Physician: Lurline Del [[id number]]  Certification:: I certify this patient will need inpatient services for at least 2 midnights  Expected Medical Readiness: 02/25/2023          B Medical/Surgery History Past Medical History:  Diagnosis Date   Abnormal LFTs    Allergic rhinitis    Anemia    Anxiety    Arthritis    Chronic back pain    Chronic headaches    Chronic  kidney disease, unspecified    DDD (degenerative disc disease)    GERD (gastroesophageal reflux disease)    Headache    Hypercalcemia    Hyperkalemia    Hyperlipidemia    Hypertension    KIDNEY SPECIALIST TOOK OFF BENICAR   IBS (irritable bowel syndrome)    Lupus    Mixed connective tissue disease (HCC)    Nausea    Neuropathy    Peptic ulcer disease    Raynaud's disease    Small kidney    left   Vitamin D deficiency disease    Past Surgical History:  Procedure Laterality Date   ABDOMINAL HYSTERECTOMY  1975   APPENDECTOMY  1972   FOOT SURGERY     LEFT   KYPHOPLASTY Bilateral 03/08/2014   Procedure: Thoracic nine Kyphoplasty;  Surgeon: Coletta Memos, MD;  Location: [facility];  Service: Neurosurgery;  Laterality: Bilateral;  Thoracic nine Kyphoplasty   LIPOMA EXCISION     back   TONSILLECTOMY     TOTAL HIP ARTHROPLASTY Left 12/26/2022   Procedure: TOTAL HIP ARTHROPLASTY ANTERIOR APPROACH;  Surgeon: Kathryne Hitch, MD;  Location: [facility] OR;  Service: Orthopedics;  Laterality: Left;   WRIST GANGLION EXCISION     left     A IV Location/Drains/Wounds Patient Lines/Drains/Airways Status     Active Line/Drains/Airways     Name Placement date Placement time Site Days  Peripheral IV 02/19/23 20 G Distal;Left;Posterior Forearm 02/19/23  0110  Forearm  less than 1   Peripheral IV 02/19/23 20 G Left;Posterior Hand 02/19/23  0145  Hand  less than 1   Urethral Catheter Alex, RN Non-latex 16 Fr. 02/19/23  0548  Non-latex  less than 1            Intake/Output Last 24 hours  Intake/Output Summary (Last 24 hours) at 02/19/2023 0549 Last data filed at 02/19/2023 0327 Gross per 24 hour  Intake 1000 ml  Output --  Net 1000 ml    Labs/Imaging Results for orders placed or performed during the hospital encounter of 02/19/23 (from the past 48 hours)  CBC with Differential     Status: Abnormal   Collection Time: 02/19/23  1:30 AM  Result Value Ref Range   WBC 10.6 (H)  4.0 - 10.5 K/uL   RBC 3.54 (L) 3.87 - 5.11 MIL/uL   Hemoglobin 9.5 (L) 12.0 - 15.0 g/dL   HCT 16.1 (L) 09.6 - 04.5 %   MCV 89.8 80.0 - 100.0 fL   MCH 26.8 26.0 - 34.0 pg   MCHC 29.9 (L) 30.0 - 36.0 g/dL   RDW 40.9 81.1 - 91.4 %   Platelets 285 150 - 400 K/uL   nRBC 0.0 0.0 - 0.2 %   Neutrophils Relative % 73 %   Neutro Abs 7.9 (H) 1.7 - 7.7 K/uL   Lymphocytes Relative 20 %   Lymphs Abs 2.1 0.7 - 4.0 K/uL   Monocytes Relative 5 %   Monocytes Absolute 0.5 0.1 - 1.0 K/uL   Eosinophils Relative 1 %   Eosinophils Absolute 0.1 0.0 - 0.5 K/uL   Basophils Relative 1 %   Basophils Absolute 0.1 0.0 - 0.1 K/uL   Immature Granulocytes 0 %   Abs Immature Granulocytes 0.02 0.00 - 0.07 K/uL    Comment: Performed at [facility], [address], [place], [place] [address]  Basic metabolic panel     Status: Abnormal   Collection Time: 02/19/23  1:30 AM  Result Value Ref Range   Sodium 138 135 - 145 mmol/L   Potassium 3.8 3.5 - 5.1 mmol/L   Chloride 107 98 - 111 mmol/L   CO2 20 (L) 22 - 32 mmol/L   Glucose, Bld 93 70 - 99 mg/dL    Comment: Glucose reference range applies only to samples taken after fasting for at least 8 hours.   BUN 29 (H) 8 - 23 mg/dL   Creatinine, Ser 6.21 (H) 0.44 - 1.00 mg/dL   Calcium 9.2 8.9 - 30.8 mg/dL   GFR, Estimated 29 (L) >60 mL/min    Comment: (NOTE) Calculated using the CKD-EPI Creatinine Equation (2021)    Anion gap 11 5 - 15    Comment: Performed at [facility], [address], [place], [place] [address]  Type and screen [facility]     Status: None   Collection Time: 02/19/23  3:32 AM  Result Value Ref Range   ABO/RH(D) A POS    Antibody Screen NEG    Sample Expiration      02/22/2023,2359 Performed at [facility], [address], [place], [place] [address]   CBC     Status: Abnormal   Collection Time: 02/19/23  3:42 AM  Result Value Ref Range   WBC 9.8 4.0 - 10.5 K/uL   RBC 3.57 (L) 3.87 - 5.11 MIL/uL    Hemoglobin 9.6 (  L) 12.0 - 15.0 g/dL   HCT 40.9 (L) 81.1 - 91.4 %   MCV 90.2 80.0 - 100.0 fL   MCH 26.9 26.0 - 34.0 pg   MCHC 29.8 (L) 30.0 - 36.0 g/dL   RDW 78.2 95.6 - 21.3 %   Platelets 277 150 - 400 K/uL   nRBC 0.0 0.0 - 0.2 %    Comment: Performed at [facility], [address], [place], [place] [address]  Basic metabolic panel     Status: Abnormal   Collection Time: 02/19/23  3:42 AM  Result Value Ref Range   Sodium 138 135 - 145 mmol/L   Potassium 3.8 3.5 - 5.1 mmol/L   Chloride 106 98 - 111 mmol/L   CO2 20 (L) 22 - 32 mmol/L   Glucose, Bld 89 70 - 99 mg/dL    Comment: Glucose reference range applies only to samples taken after fasting for at least 8 hours.   BUN 27 (H) 8 - 23 mg/dL   Creatinine, Ser 8.46 (H) 0.44 - 1.00 mg/dL   Calcium 9.2 8.9 - 96.2 mg/dL   GFR, Estimated 33 (L) >60 mL/min    Comment: (NOTE) Calculated using the CKD-EPI Creatinine Equation (2021)    Anion gap 12 5 - 15    Comment: Performed at [facility], [address] [place], [place] [address]   Chest Portable 1 View Result Date: 02/19/2023 CLINICAL DATA:  Admission for hip fracture EXAM: PORTABLE CHEST 1 VIEW COMPARISON:  10/12/2013 FINDINGS: Stable cardiomediastinal silhouette. Aortic atherosclerotic calcification. Bronchial wall thickening and diffuse interstitial coarsening. No focal pneumonia. No pleural effusion or pneumothorax. No displaced rib fractures. IMPRESSION: Bronchial wall thickening and diffuse interstitial coarsening which may be due to bronchitis. Electronically Signed   By: Minerva Fester M.D.   On: 02/19/2023 03:56   DG Hip Unilat W or Wo Pelvis 1 View Right Result Date: 02/19/2023 CLINICAL DATA:  Status post fall. EXAM: DG HIP (WITH OR WITHOUT PELVIS) 1V RIGHT COMPARISON:  February 12, 2023 FINDINGS: There is attacked total left hip replacement. An acute fracture deformity is seen extending through the neck of the proximal right femur. There is no evidence of  dislocation. A stable deformity of indeterminate age is seen involving the right inferior pubic ramus. Advanced degenerative changes are noted involving the right hip in the form of joint space narrowing and acetabular sclerosis. IMPRESSION: 1. Acute fracture of the proximal right femur. 2. Stable deformity of the right inferior pubic ramus of indeterminate age. 3. Advanced degenerative changes of the right hip. 4. Intact total left hip replacement. Electronically Signed   By: Aram Candela M.D.   On: 02/19/2023 01:53    Pending Labs Unresulted Labs (From admission, onward)    None       Vitals/Pain Today's Vitals   02/19/23 0445 02/19/23 0457 02/19/23 0500 02/19/23 0539  BP:   (!) 142/68   Pulse: 88 89 88   Resp: 12 13 11    Temp:   98.9 F (37.2 C)   TempSrc:   Oral   SpO2: 96% 94% 98%   Weight:      Height:      PainSc:    7     Isolation Precautions No active isolations  Medications Medications  HYDROcodone-acetaminophen (NORCO/VICODIN) 5-325 MG per tablet 1-2 tablet (has no administration in time range)  morphine (PF) 2 MG/ML injection 0.5 mg (0.5 mg Intravenous Given 02/19/23 0539)  docusate sodium (COLACE) capsule 100  mg (has no administration in time range)  0.9 %  sodium chloride infusion ( Intravenous New Bag/Given 02/19/23 0334)  ceFAZolin (ANCEF) IVPB 2g/100 mL premix (has no administration in time range)  tranexamic acid (CYKLOKAPRON) IVPB 1,000 mg (has no administration in time range)  fentaNYL (SUBLIMAZE) injection 50 mcg (50 mcg Intravenous Given 02/19/23 0120)  fentaNYL (SUBLIMAZE) injection 50 mcg (50 mcg Intravenous Given 02/19/23 0224)  lactated ringers bolus 1,000 mL (0 mLs Intravenous Stopped 02/19/23 0327)    Mobility non-ambulatory     Focused Assessments     R Recommendations: See Admitting Provider Note  Report given to:   Additional Notes:

## 2023-02-19 NOTE — Transfer of Care (Signed)
Immediate Anesthesia Transfer of Care Note  Patient: Holly Jimenez  Procedure(s) Performed: TOTAL HIP ARTHROPLASTY ANTERIOR APPROACH (Right: Hip)  Patient Location: PACU  Anesthesia Type:Spinal  Level of Consciousness: awake, alert , oriented, and patient cooperative  Airway & Oxygen Therapy: Patient Spontanous Breathing and Patient connected to nasal cannula oxygen  Post-op Assessment: Report given to RN and Post -op Vital signs reviewed and stable  Post vital signs: Reviewed and stable  Last Vitals:  Vitals Value Taken Time  BP 102/49 02/19/23 1136  Temp 98.2   Pulse 86 02/19/23 1140  Resp 14 02/19/23 1140  SpO2 98 % 02/19/23 1140  Vitals shown include unfiled device data.  Last Pain:  Vitals:   02/19/23 0915  TempSrc:   PainSc: 10-Worst pain ever         Complications: No notable events documented.

## 2023-02-19 NOTE — Progress Notes (Signed)
Initial Nutrition Assessment  DOCUMENTATION CODES:  Underweight  INTERVENTION:  Recommend advancement of diet after surgery to regular Ensure Enlive po TID, each supplement provides 350 kcal and 20 grams of protein. MVI with minerals daily  NUTRITION DIAGNOSIS:  Inadequate oral intake related to decreased appetite as evidenced by  (underweight BMI).  GOAL:  Patient will meet greater than or equal to 90% of their needs  MONITOR:  PO intake, Supplement acceptance, Diet advancement, Labs, Weight trends, Skin  REASON FOR ASSESSMENT:  Consult Hip fracture protocol  ASSESSMENT:  Pt with hx of HLD, HTN, GERD, Lupus, PUD, IBS, CKD IIIb, and osteoarthritis presented to ED after a fall at home. Imaging in ED showed acute right proximal femur fracture.  Noted that pt is status post left total hip arthroplasty 12/26/22   2/13 - Op, direct anterior total hip arthroplasty right side  Pt out of room at the time of assessment, in OR for right hip repair. No family in room to obtain a hx. No meals recorded as pt arrived early this AM and has been NPO for surgery. Current weight appears stated, but if accurate pt has had severe weight loss of 15.9% x 6 months. Will follow-up in person for hx and physical exam.  Admit / Current weight: 40.8 kg    Nutritionally Relevant Medications: Scheduled Meds:  docusate sodium  100 mg Oral BID   pantoprazole  40 mg Oral Daily   Continuous Infusions:  sodium chloride 50 mL/hr at 02/19/23 0334   ceFAZolin (ANCEF) IV     Labs Reviewed: BUN 27, creatinine 1.57 CBG ranges from 89-93 mg/dL over the last 24 hours HgbA1c 5.5% (05/2019)  NUTRITION - FOCUSED PHYSICAL EXAM: Defer to in-person assessment  Diet Order:   Diet Order             Diet NPO time specified Except for: Sips with Meds  Diet effective now                  EDUCATION NEEDS:  No education needs have been identified at this time  Skin:  Skin Assessment: Reviewed RN  Assessment  Last BM:  unsure  Height:  Ht Readings from Last 1 Encounters:  02/19/23 5\' 1"  (1.549 m)   Weight:  Wt Readings from Last 1 Encounters:  02/19/23 40.8 kg    Ideal Body Weight:  47.7 kg  BMI:  Body mass index is 17.01 kg/m.  Estimated Nutritional Needs:  Kcal:  1400-1600 kcal/d Protein:  70-90g/d Fluid:  >/=1.5L/d    Greig Castilla, RD, LDN Registered Dietitian II Please reach out via secure chat Weekend on-call pager # available in [facility]

## 2023-02-19 NOTE — Progress Notes (Signed)
OT Cancellation Note  Patient Details Name: Holly Jimenez MRN: [id number] DOB: January 11, 1945   Cancelled Treatment:    Reason Eval/Treat Not Completed: Patient at procedure or test/ unavailable (in OR). Pt going for THA today per ortho note. Will continue to follow for evaluation after surgery.   [name] [name] 02/19/2023, 11:08 AM  [name] OTR/L Acute Rehabilitation Services Office: [phone]

## 2023-02-19 NOTE — Progress Notes (Signed)
Pt received from ED; alert and oriented x3-4; foley catheter placed in ED; NPO for possible procedure today; patient report to be endorsed to dayshift RN.

## 2023-02-19 NOTE — Progress Notes (Signed)
Patient ID: Holly Jimenez, female   DOB: 04-07-1945, 79 y.o.   MRN: [id number] I was just called about the this patient being at [facility] and admitted to the emergency room earlier this morning after mechanical fall in which she sustained a right hip displaced femoral neck fracture.  I actually replaced her left hip back in December just a few months ago after mechanical fall in which she fractured that left hip.  Recommendations for treating this right hip fracture would be at a direct anterior total hip arthroplasty on the right side.  She has been n.p.o. and we have tentatively put her on the operating room schedule for late this morning.  However this depends on timing in the OR.  Worst case scenario is having to delay her surgery until tomorrow afternoon.

## 2023-02-19 NOTE — Progress Notes (Signed)
PT Cancellation Note  Patient Details Name: Holly Jimenez MRN: [id number] DOB: 03/14/1945   Cancelled Treatment:    Reason Eval/Treat Not Completed: Medical issues which prohibited therapy (Pt going for THA today per ortho note. Will evaluate after surgery.)   Bevelyn Buckles 02/19/2023, 8:46 AM Jacobey Gura M,PT Acute Rehab Services [phone]

## 2023-02-19 NOTE — ED Notes (Signed)
Patient's heels elevated with pillows and ice applied to R hip

## 2023-02-19 NOTE — Plan of Care (Signed)

## 2023-02-19 NOTE — H&P (Addendum)
History and Physical    Holly Jimenez:[id number] DOB: 02-11-1945 DOA: 02/19/2023  PCP: Irena Reichmann, DO  Patient coming from: NH  I have personally briefly reviewed patient's old medical records in [facility]  Chief Complaint: hip pain s/p fall  HPI: Holly Jimenez is a 78 y.o. female with medical history significant of Raynaud's disease, CKD stage IIIb, DDD, GERD, osteoarthritis, hypertension, peptic ulcer disease ,status post left total hip arthroplasty 12/26/2022. Patient now presents to ED s/p fall at [facility] with hip pain. Patient currently somnolent after being medications. States she was attempting to get her medications when she fell.  She notes no fever/chills/ sob/ chest pain /n or vomiting.   ED Course:  Vitals:  Afeb, bp 129/98, hr 84, rr 22 sat 91 % /88 s/p medication IN ED on  evaluation patient was found to have acute right proximal femur fracture. Patient case was discussed with Ortho( Dr Christell Constant) who will see patient in am   EKG: nsr LVH/LAD  XRAY /hip IMPRESSION: 1. Acute fracture of the proximal right femur. 2. Stable deformity of the right inferior pubic ramus of indeterminate age. 3. Advanced degenerative changes of the right hip. 4. Intact total left hip replacement.  Labs: Wbc 10.6, Hgb 9.5 at baseline, plt 285 Na 138, K 3.8, CL 107, bicarb 20 cr 1.78 (1.57) Review of Systems: As per HPI otherwise 10 point review of systems negative.   Past Medical History:  Diagnosis Date   Abnormal LFTs    Allergic rhinitis    Anemia    Anxiety    Arthritis    Chronic back pain    Chronic headaches    Chronic kidney disease, unspecified    DDD (degenerative disc disease)    GERD (gastroesophageal reflux disease)    Headache    Hypercalcemia    Hyperkalemia    Hyperlipidemia    Hypertension    KIDNEY SPECIALIST TOOK OFF BENICAR   IBS (irritable bowel syndrome)    Lupus    Mixed connective tissue disease (HCC)    Nausea     Neuropathy    Peptic ulcer disease    Raynaud's disease    Small kidney    left   Vitamin D deficiency disease     Past Surgical History:  Procedure Laterality Date   ABDOMINAL HYSTERECTOMY  1975   APPENDECTOMY  1972   FOOT SURGERY     LEFT   KYPHOPLASTY Bilateral 03/08/2014   Procedure: Thoracic nine Kyphoplasty;  Surgeon: Coletta Memos, MD;  Location: [facility];  Service: Neurosurgery;  Laterality: Bilateral;  Thoracic nine Kyphoplasty   LIPOMA EXCISION     back   TONSILLECTOMY     TOTAL HIP ARTHROPLASTY Left 12/26/2022   Procedure: TOTAL HIP ARTHROPLASTY ANTERIOR APPROACH;  Surgeon: Kathryne Hitch, MD;  Location: [facility] OR;  Service: Orthopedics;  Laterality: Left;   WRIST GANGLION EXCISION     left     reports that she quit smoking about 22 years ago. Her smoking use included cigarettes. She started smoking about 47 years ago. She has a 25 pack-year smoking history. She has never used smokeless tobacco. She reports current alcohol use. She reports that she does not use drugs.  Allergies  Allergen Reactions   Sulfa Antibiotics     Other reaction(s): Other (See Comments)  Other Reaction(s): Other (See Comments)   Sulfonamide Derivatives Hives   Methocarbamol     Other reaction(s): nightmares   Pancrelipase (Lip-Prot-Amyl)  Other reaction(s): indigestion   Sulfacetamide Sodium-Sulfur     Other reaction(s): swelling    Family History  Problem Relation Age of Onset   Ovarian cancer Sister    Colon cancer Sister    Heart disease Paternal Grandfather    Heart disease Maternal Grandfather    Kidney disease Brother    Kidney disease Mother    Hypertension Mother    Kidney disease Maternal Grandmother    Diabetes Paternal Grandmother    Asthma Father    Suicidality Father    Alcoholism Father     Prior to Admission medications   Medication Sig Start Date End Date Taking? Authorizing Provider  acetaminophen (TYLENOL) 500 MG tablet Take 2 tablets (1,000 mg  total) by mouth every 8 (eight) hours. 12/30/22   Lorin Glass, MD  amLODipine (NORVASC) 5 MG tablet Take 5 mg by mouth 2 (two) times daily. 06/21/20   [provider]  Leda Min 5-2.5-18.5 LF-MCG/0.5 injection  08/07/21   [provider]  cetirizine (ZYRTEC) 5 MG tablet Take 5 mg by mouth daily as needed for allergies.     [provider]  cholecalciferol (VITAMIN D) 1000 UNITS tablet Take 1,000 Units by mouth daily.    [provider]  clotrimazole-betamethasone (LOTRISONE) cream Apply topically. 02/23/18   [provider]  diclofenac Sodium (VOLTAREN) 1 % GEL Apply topically. 02/23/18   [provider]  estradiol (ESTRACE) 0.1 MG/GM vaginal cream  12/15/17   [provider]  ferrous gluconate (FERGON) 240 (27 FE) MG tablet Take 240 mg by mouth 2 (two) times daily.    [provider]  hydroxychloroquine (PLAQUENIL) 200 MG tablet Take 200 mg by mouth 2 (two) times daily.     [provider]  ketoconazole (NIZORAL) 2 % cream Apply 1 application topically daily as needed (infection treatment).     [provider]  Lactobacillus Rhamnosus, GG, (RA PROBIOTIC DIGESTIVE CARE) CAPS Take 1 capsule by mouth daily.    [provider]  Lidocaine 4 % PTCH Apply 1 patch topically daily as needed. 12 hours on, 12 hours off 09/22/19   Raulkar, Drema Pry, MD  losartan (COZAAR) 50 MG tablet Take 50 mg by mouth in the morning and at bedtime. 11/02/19   [provider]  Multiple Vitamin (MULTIVITAMIN WITH MINERALS) TABS tablet Take 1 tablet by mouth daily.    [provider]  Nutritional Supplements (FEEDING SUPPLEMENT, NEPRO CARB STEADY,) LIQD Take 237 mLs by mouth 2 (two) times daily between meals. 12/30/22   Lorin Glass, MD  omeprazole (PRILOSEC) 20 MG capsule Take 20 mg by mouth 2 (two) times daily.  09/17/11   [provider]  oxybutynin (DITROPAN) 5 MG tablet Take 5 mg by mouth 2 (two) times  daily. 10/17/19   [provider]  oxyCODONE (OXY IR/ROXICODONE) 5 MG immediate release tablet Take 1 tablet (5 mg total) by mouth every 6 (six) hours as needed for moderate pain (pain score 4-6) or breakthrough pain. 02/16/23   Kathryne Hitch, MD  PFIZER-BIONTECH COVID-19 VACC 30 MCG/0.3ML injection  02/03/20   [provider]  PHENADOZ 25 MG suppository Place 25 mg rectally every 6 (six) hours as needed for nausea or vomiting.  01/12/14   [provider]  polyethylene glycol (MIRALAX / GLYCOLAX) 17 g packet Take 17 g by mouth daily as needed for mild constipation. 12/30/22   Lorin Glass, MD  predniSONE (DELTASONE) 5 MG tablet Take 5 mg by mouth daily. continuous  [provider]  pregabalin (LYRICA) 75 MG capsule Take 75 mg by mouth daily. 04/28/22   [provider]  pyridOXINE (VITAMIN B-6) 100 MG tablet Take 100 mg by mouth daily.    [provider]  [facility] injection  07/04/20   [provider]  Tetrahydrozoline HCl (VISINE OP) Place 2 drops into both eyes as needed (for dry eyes).    [provider]  vitamin B-12 (CYANOCOBALAMIN) 100 MCG tablet     [provider]  vitamin B-12 (CYANOCOBALAMIN) 1000 MCG tablet Take 1,000 mcg by mouth daily.    [provider]    Physical Exam: Vitals:   02/19/23 0200 02/19/23 0204 02/19/23 0215 02/19/23 0230  BP: (!) 141/111  (!) 142/81 (!) 148/54  Pulse:  81 80 78  Resp: 19 20 12 10   Temp:      TempSrc:      SpO2:  97% 97% 96%  Weight:      Height:        Constitutional: NAD, calm, comfortable Vitals:   02/19/23 0200 02/19/23 0204 02/19/23 0215 02/19/23 0230  BP: (!) 141/111  (!) 142/81 (!) 148/54  Pulse:  81 80 78  Resp: 19 20 12 10   Temp:      TempSrc:      SpO2:  97% 97% 96%  Weight:      Height:       Eyes:eomi,pupils equal, lids and conjunctivae normal ENMT: Mucous membranes are dry Neck: normal, supple, no masses, no  thyromegaly Respiratory: clear to auscultation bilaterally, no wheezing, no crackles. Normal respiratory effort. No accessory muscle use.  Cardiovascular: Regular rate and rhythm, no murmurs / rubs / gallops. No extremity edema. 2+ pedal pulses.  Abdomen: no tenderness, no masses palpated. No hepatosplenomegaly. Bowel sounds positive.  Musculoskeletal: no clubbing / cyanosis. Right leg short than left /pain with ROM,no contractures. Normal muscle tone.  Skin: no rashes, lesions, ulcers. No induration Neurologic: CN 2-12 grossly intact. Sensation intact Strength 5/5 in all 4.  Psychiatric: Normal judgment and insight. Alert and oriented x 3. Normal mood.    Labs on Admission: I have personally reviewed following labs and imaging studies  CBC: Recent Labs  Lab 02/19/23 0130  WBC 10.6*  NEUTROABS 7.9*  HGB 9.5*  HCT 31.8*  MCV 89.8  PLT 285   Basic Metabolic Panel: Recent Labs  Lab 02/19/23 0130  NA 138  K 3.8  CL 107  CO2 20*  GLUCOSE 93  BUN 29*  CREATININE 1.78*  CALCIUM 9.2   GFR: Estimated Creatinine Clearance: 16.5 mL/min (A) (by C-G formula based on SCr of 1.78 mg/dL (H)). Liver Function Tests: No results for input(s): "AST", "ALT", "ALKPHOS", "BILITOT", "PROT", "ALBUMIN" in the last 168 hours. No results for input(s): "LIPASE", "AMYLASE" in the last 168 hours. No results for input(s): "AMMONIA" in the last 168 hours. Coagulation Profile: No results for input(s): "INR", "PROTIME" in the last 168 hours. Cardiac Enzymes: No results for input(s): "CKTOTAL", "CKMB", "CKMBINDEX", "TROPONINI" in the last 168 hours. BNP (last 3 results) No results for input(s): "PROBNP" in the last 8760 hours. HbA1C: No results for input(s): "HGBA1C" in the last 72 hours. CBG: No results for input(s): "GLUCAP" in the last 168 hours. Lipid Profile: No results for input(s): "CHOL", "HDL", "LDLCALC", "TRIG", "CHOLHDL", "LDLDIRECT" in the last 72 hours. Thyroid Function Tests: No  results for input(s): "TSH", "T4TOTAL", "FREET4", "T3FREE", "THYROIDAB" in the last 72 hours. Anemia Panel: No results for input(s): "VITAMINB12", "FOLATE", "  FERRITIN", "TIBC", "IRON", "RETICCTPCT" in the last 72 hours. Urine analysis:    Component Value Date/Time   COLORURINE YELLOW 12/25/2022 0530   APPEARANCEUR CLEAR 12/25/2022 0530   LABSPEC 1.012 12/25/2022 0530   PHURINE 6.0 12/25/2022 0530   GLUCOSEU NEGATIVE 12/25/2022 0530   HGBUR NEGATIVE 12/25/2022 0530   BILIRUBINUR NEGATIVE 12/25/2022 0530   KETONESUR NEGATIVE 12/25/2022 0530   PROTEINUR NEGATIVE 12/25/2022 0530   NITRITE NEGATIVE 12/25/2022 0530   LEUKOCYTESUR NEGATIVE 12/25/2022 0530    Radiological Exams on Admission: DG Hip Unilat W or Wo Pelvis 1 View Right Result Date: 02/19/2023 CLINICAL DATA:  Status post fall. EXAM: DG HIP (WITH OR WITHOUT PELVIS) 1V RIGHT COMPARISON:  February 12, 2023 FINDINGS: There is attacked total left hip replacement. An acute fracture deformity is seen extending through the neck of the proximal right femur. There is no evidence of dislocation. A stable deformity of indeterminate age is seen involving the right inferior pubic ramus. Advanced degenerative changes are noted involving the right hip in the form of joint space narrowing and acetabular sclerosis. IMPRESSION: 1. Acute fracture of the proximal right femur. 2. Stable deformity of the right inferior pubic ramus of indeterminate age. 3. Advanced degenerative changes of the right hip. 4. Intact total left hip replacement. Electronically Signed   By: Aram Candela M.D.   On: 02/19/2023 01:53    EKG: Independently reviewed. See above  Assessment/Plan  Acute fracture of the proximal right femur s/p fall -admit to med tele -place on hip protocol - pain management per protocol/smallest effective dosing   Hypoxemia -occurred after  iv pain medication   - resolved with 2L [place]  -f/u on cxr to be complete -wean O2 as able   Mild AKI   on CKD3b -hold nephrotoxic medications  - continue with gentle ivfs  -strict I/o    Raynaud's disease -continue plaquenil   GERD -ppi   Hypertension -stable Losartan once cr stable  -prn hydralazine  Status post left total hip arthroplasty 12/26/2022   Patient is low risk for low risk procedure and is cleared for surgery   DVT prophylaxis: heparin Code Status: full/ as discussed per patient wishes in event of cardiac arrest  Family Communication: none at bedside Disposition Plan: patient  expected to be admitted greater than 2 midnights   Consults called: Dr Moore/ Dr Rayburn Ma Admission status: med tele   Lurline Del MD [facility]   If 7PM-7AM, please contact night-coverage [email or web address] Password [facility]  02/19/2023, 2:55 AM

## 2023-02-19 NOTE — ED Provider Notes (Signed)
[place] EMERGENCY DEPARTMENT AT [facility] Provider Note   CSN: [id number] Arrival date & time: 02/19/23  0107     History  Chief Complaint  Patient presents with   Hip Pain    Right    RAKIYAH ESCH is a 78 y.o. female.  78 yo F w/ fall tonight of unclear etiology but worsening pain since then and unable to ambulate so called EMS and brought here for further eval. H/o L hip arthroplasty w/ [facility] a few months ago.    Hip Pain       Home Medications Prior to Admission medications   Medication Sig Start Date End Date Taking? Authorizing Provider  acetaminophen (TYLENOL) 500 MG tablet Take 2 tablets (1,000 mg total) by mouth every 8 (eight) hours. 12/30/22   Lorin Glass, MD  amLODipine (NORVASC) 5 MG tablet Take 5 mg by mouth 2 (two) times daily. 06/21/20   [provider]  Leda Min 5-2.5-18.5 LF-MCG/0.5 injection  08/07/21   [provider]  cetirizine (ZYRTEC) 5 MG tablet Take 5 mg by mouth daily as needed for allergies.     [provider]  cholecalciferol (VITAMIN D) 1000 UNITS tablet Take 1,000 Units by mouth daily.    [provider]  clotrimazole-betamethasone (LOTRISONE) cream Apply topically. 02/23/18   [provider]  diclofenac Sodium (VOLTAREN) 1 % GEL Apply topically. 02/23/18   [provider]  estradiol (ESTRACE) 0.1 MG/GM vaginal cream  12/15/17   [provider]  ferrous gluconate (FERGON) 240 (27 FE) MG tablet Take 240 mg by mouth 2 (two) times daily.    [provider]  hydroxychloroquine (PLAQUENIL) 200 MG tablet Take 200 mg by mouth 2 (two) times daily.     [provider]  ketoconazole (NIZORAL) 2 % cream Apply 1 application topically daily as needed (infection treatment).     [provider]  Lactobacillus Rhamnosus, GG, (RA PROBIOTIC DIGESTIVE CARE) CAPS Take 1 capsule by mouth daily.    [provider]  Lidocaine 4 % PTCH Apply 1  patch topically daily as needed. 12 hours on, 12 hours off 09/22/19   Raulkar, Drema Pry, MD  losartan (COZAAR) 50 MG tablet Take 50 mg by mouth in the morning and at bedtime. 11/02/19   [provider]  Multiple Vitamin (MULTIVITAMIN WITH MINERALS) TABS tablet Take 1 tablet by mouth daily.    [provider]  Nutritional Supplements (FEEDING SUPPLEMENT, NEPRO CARB STEADY,) LIQD Take 237 mLs by mouth 2 (two) times daily between meals. 12/30/22   Lorin Glass, MD  omeprazole (PRILOSEC) 20 MG capsule Take 20 mg by mouth 2 (two) times daily.  09/17/11   [provider]  oxybutynin (DITROPAN) 5 MG tablet Take 5 mg by mouth 2 (two) times daily. 10/17/19   [provider]  oxyCODONE (OXY IR/ROXICODONE) 5 MG immediate release tablet Take 1 tablet (5 mg total) by mouth every 6 (six) hours as needed for moderate pain (pain score 4-6) or breakthrough pain. 02/16/23   Kathryne Hitch, MD  PFIZER-BIONTECH COVID-19 VACC 30 MCG/0.3ML injection  02/03/20   [provider]  PHENADOZ 25 MG suppository Place 25 mg rectally every 6 (six) hours as needed for nausea or vomiting.  01/12/14   [provider]  polyethylene glycol (MIRALAX / GLYCOLAX) 17 g packet Take 17 g by mouth daily as needed for mild constipation. 12/30/22   Lorin Glass, MD  predniSONE (DELTASONE) 5 MG tablet Take 5 mg by  mouth daily. continuous    [provider]  pregabalin (LYRICA) 75 MG capsule Take 75 mg by mouth daily. 04/28/22   [provider]  pyridOXINE (VITAMIN B-6) 100 MG tablet Take 100 mg by mouth daily.    [provider]  [facility] injection  07/04/20   [provider]  Tetrahydrozoline HCl (VISINE OP) Place 2 drops into both eyes as needed (for dry eyes).    [provider]  vitamin B-12 (CYANOCOBALAMIN) 100 MCG tablet     [provider]  vitamin B-12 (CYANOCOBALAMIN) 1000 MCG tablet Take 1,000 mcg by mouth daily.    [provider]      Allergies    Sulfa antibiotics, Sulfonamide derivatives, Methocarbamol, Pancrelipase (lip-prot-amyl), and Sulfacetamide sodium-sulfur    Review of Systems   Review of Systems  Physical Exam Updated Vital Signs BP (!) 141/111   Pulse 81   Temp 98.5 F (36.9 C) (Oral)   Resp 20   Ht 5\' 1"  (1.549 m)   Wt 40.8 kg   SpO2 97%   BMI 17.01 kg/m  Physical Exam Vitals and nursing note reviewed.  Constitutional:      Appearance: She is well-developed.  HENT:     Head: Normocephalic and atraumatic.  Cardiovascular:     Rate and Rhythm: Normal rate and regular rhythm.  Pulmonary:     Effort: No respiratory distress.     Breath sounds: No stridor.  Abdominal:     General: There is no distension.  Musculoskeletal:     Cervical back: Normal range of motion.     Comments: Right leg shortened and outwardly rotated.  Skin:    General: Skin is warm and dry.  Neurological:     General: No focal deficit present.     Mental Status: She is alert.     ED Results / Procedures / Treatments   Labs (all labs ordered are listed, but only abnormal results are displayed) Labs Reviewed  CBC WITH DIFFERENTIAL/PLATELET - Abnormal; Notable for the following components:      Result Value   WBC 10.6 (*)    RBC 3.54 (*)    Hemoglobin 9.5 (*)    HCT 31.8 (*)    MCHC 29.9 (*)    Neutro Abs 7.9 (*)    All other components within normal limits  BASIC METABOLIC PANEL - Abnormal; Notable for the following components:   CO2 20 (*)    BUN 29 (*)    Creatinine, Ser 1.78 (*)    GFR, Estimated 29 (*)    All other components within normal limits    EKG None  Radiology DG Hip Unilat W or Wo Pelvis 1 View Right Result Date: 02/19/2023 CLINICAL DATA:  Status post fall. EXAM: DG HIP (WITH OR WITHOUT PELVIS) 1V RIGHT COMPARISON:  February 12, 2023 FINDINGS: There is attacked total left hip replacement. An acute fracture deformity is seen extending through the neck of the proximal  right femur. There is no evidence of dislocation. A stable deformity of indeterminate age is seen involving the right inferior pubic ramus. Advanced degenerative changes are noted involving the right hip in the form of joint space narrowing and acetabular sclerosis. IMPRESSION: 1. Acute fracture of the proximal right femur. 2. Stable deformity of the right inferior pubic ramus of indeterminate age. 3. Advanced degenerative changes of the right hip. 4. Intact total left hip replacement. Electronically Signed   By: Aram Candela M.D.   On: 02/19/2023 01:53  Procedures Procedures    Medications Ordered in ED Medications  fentaNYL (SUBLIMAZE) injection 50 mcg (50 mcg Intravenous Given 02/19/23 0120)    ED Course/ Medical Decision Making/ A&P                                 Medical Decision Making Amount and/or Complexity of Data Reviewed Labs: ordered. Radiology: ordered. ECG/medicine tests: ordered.  Risk Prescription drug management. Decision regarding hospitalization.  Likely hip fracture. Xr pordered. Meds ordered.   Personally viewed and interpreted  xr with right hip fracture.  Mild AKI - fluids given.  Hip fx on rads read - d/w Dr. Christell Constant - npo and decide later in AM, will consult TRH.  TRH to admit.   Final Clinical Impression(s) / ED Diagnoses Final diagnoses:  None    Rx / DC Orders ED Discharge Orders     None         Maurie Olesen, Barbara Cower, MD 02/20/23 0222

## 2023-02-20 ENCOUNTER — Encounter (HOSPITAL_COMMUNITY): Payer: Self-pay | Admitting: Orthopaedic Surgery

## 2023-02-20 DIAGNOSIS — S72001A Fracture of unspecified part of neck of right femur, initial encounter for closed fracture: Secondary | ICD-10-CM | POA: Diagnosis not present

## 2023-02-20 LAB — CBC
HCT: 26.2 % — ABNORMAL LOW (ref 36.0–46.0)
Hemoglobin: 8.1 g/dL — ABNORMAL LOW (ref 12.0–15.0)
MCH: 27.4 pg (ref 26.0–34.0)
MCHC: 30.9 g/dL (ref 30.0–36.0)
MCV: 88.5 fL (ref 80.0–100.0)
Platelets: 223 10*3/uL (ref 150–400)
RBC: 2.96 MIL/uL — ABNORMAL LOW (ref 3.87–5.11)
RDW: 13.5 % (ref 11.5–15.5)
WBC: 11.2 10*3/uL — ABNORMAL HIGH (ref 4.0–10.5)
nRBC: 0 % (ref 0.0–0.2)

## 2023-02-20 MED ORDER — PREGABALIN 75 MG PO CAPS
75.0000 mg | ORAL_CAPSULE | Freq: Every day | ORAL | Status: DC
  Administered 2023-02-20 – 2023-02-25 (×6): 75 mg via ORAL
  Filled 2023-02-20 (×6): qty 1

## 2023-02-20 MED ORDER — LOSARTAN POTASSIUM 50 MG PO TABS
50.0000 mg | ORAL_TABLET | Freq: Every day | ORAL | Status: DC
  Administered 2023-02-22: 50 mg via ORAL
  Filled 2023-02-20 (×6): qty 1

## 2023-02-20 MED ORDER — ENSURE ENLIVE PO LIQD
237.0000 mL | Freq: Three times a day (TID) | ORAL | Status: DC
  Administered 2023-02-20 – 2023-02-25 (×11): 237 mL via ORAL

## 2023-02-20 MED ORDER — HYDROXYCHLOROQUINE SULFATE 200 MG PO TABS
200.0000 mg | ORAL_TABLET | Freq: Two times a day (BID) | ORAL | Status: DC
  Administered 2023-02-20 – 2023-02-25 (×11): 200 mg via ORAL
  Filled 2023-02-20 (×11): qty 1

## 2023-02-20 MED ORDER — VITAMIN D 25 MCG (1000 UNIT) PO TABS
1000.0000 [IU] | ORAL_TABLET | Freq: Every day | ORAL | Status: DC
  Administered 2023-02-20 – 2023-02-25 (×6): 1000 [IU] via ORAL
  Filled 2023-02-20 (×6): qty 1

## 2023-02-20 MED ORDER — ASPIRIN 81 MG PO CHEW: 81.0000 mg | CHEWABLE_TABLET | Freq: Two times a day (BID) | ORAL | 0 refills | Status: AC

## 2023-02-20 MED ORDER — NAPHAZOLINE-PHENIRAMINE 0.025-0.3 % OP SOLN: 2.0000 [drp] | Freq: Four times a day (QID) | OPHTHALMIC | Status: DC | PRN

## 2023-02-20 MED ORDER — PREDNISONE 5 MG PO TABS
5.0000 mg | ORAL_TABLET | Freq: Every day | ORAL | Status: DC
Start: 2023-02-20 — End: 2023-02-25
  Administered 2023-02-20 – 2023-02-25 (×6): 5 mg via ORAL
  Filled 2023-02-20 (×6): qty 1

## 2023-02-20 MED ORDER — VITAMIN B-6 100 MG PO TABS
100.0000 mg | ORAL_TABLET | Freq: Every day | ORAL | Status: DC
  Administered 2023-02-20 – 2023-02-25 (×6): 100 mg via ORAL
  Filled 2023-02-20 (×6): qty 1

## 2023-02-20 MED ORDER — RISAQUAD PO CAPS
1.0000 | ORAL_CAPSULE | Freq: Every day | ORAL | Status: DC
  Administered 2023-02-21 – 2023-02-25 (×5): 1 via ORAL
  Filled 2023-02-20 (×5): qty 1

## 2023-02-20 MED ORDER — FERROUS GLUCONATE 324 (38 FE) MG PO TABS
324.0000 mg | ORAL_TABLET | Freq: Two times a day (BID) | ORAL | Status: DC
  Administered 2023-02-20 – 2023-02-25 (×11): 324 mg via ORAL
  Filled 2023-02-20 (×11): qty 1

## 2023-02-20 MED ORDER — ADULT MULTIVITAMIN W/MINERALS CH
1.0000 | ORAL_TABLET | Freq: Every day | ORAL | Status: DC
  Administered 2023-02-20 – 2023-02-25 (×6): 1 via ORAL
  Filled 2023-02-20 (×7): qty 1

## 2023-02-20 MED ORDER — MIRTAZAPINE 15 MG PO TABS
15.0000 mg | ORAL_TABLET | Freq: Every day | ORAL | Status: DC
  Administered 2023-02-20 – 2023-02-24 (×5): 15 mg via ORAL
  Filled 2023-02-20 (×5): qty 1

## 2023-02-20 MED ORDER — HYDROCODONE-ACETAMINOPHEN 5-325 MG PO TABS: 1.0000 | ORAL_TABLET | Freq: Four times a day (QID) | ORAL | 0 refills | Status: DC | PRN

## 2023-02-20 MED ORDER — VITAMIN B-12 1000 MCG PO TABS
1000.0000 ug | ORAL_TABLET | Freq: Every day | ORAL | Status: DC
  Administered 2023-02-20 – 2023-02-25 (×6): 1000 ug via ORAL
  Filled 2023-02-20 (×6): qty 1

## 2023-02-20 NOTE — Plan of Care (Signed)

## 2023-02-20 NOTE — Discharge Instructions (Signed)

## 2023-02-20 NOTE — Progress Notes (Signed)
PROGRESS NOTE    Yaretsi Humphres Jarrell-Peace  XBJ:[id number] DOB: 1945-06-11 DOA: 02/19/2023 PCP: Irena Reichmann, DO    Brief Narrative:  78 year old with history of Raynaud's disease, CKD stage IIIb, GERD, osteoarthritis, hypertension, left total hip 12/2022 presented with mechanical fall and right hip pain.  She was found to have acute right proximal femoral fracture.  Underwent ORIF 2/13.  Subjective: Patient seen and examined.  Denies any complaints as her pain is controlled with injectable Dilaudid today.  Patient does anticipate to go to rehab.  She lives alone at home. Assessment & Plan:   Closed traumatic right hip fracture: Status post ORIF Dr. Magnus Ivan 2/13-anterior approach total hip arthroplasty. Weightbearing as tolerated Pain management, oral and IV opiates today. DVT prophylaxis, aspirin 81 mg twice daily. Work with PT OT today.  Refer to SNF for rehab.  Anemia of acute blood loss: chronic underlying anemia.  Baseline hemoglobin 10-11. Hemoglobin 8.1.  Presented with hemoglobin 9.5.  Expected blood loss.  Also with chronic anemia.  Currently no evidence of active bleeding.  Monitor.  Will prescribe iron on discharge.  Chronic medical history including CKD stage IIIb: At about baseline.  Creatinine 1.57.  Will recheck tomorrow morning. Raynaud's disease: On Plaquenil.  Maintenance prednisone 5 mg daily.  Continue.  Continued.  Patient also on chronic pain management with Lyrica.  This is continued. GERD: On PPI. Hypertension: On losartan at home.  Resume today.    DVT prophylaxis: SCDs Start: 02/19/23 1439 SCDs Start: 02/19/23 0315   Code Status: Full code Family Communication: None at the bedside Disposition Plan: Status is: Inpatient Remains inpatient appropriate because: Immediate postop     Consultants:  Orthopedics  Procedures:  ORIF right hip  Antimicrobials:  None     Objective: Vitals:   02/19/23 1345 02/19/23 1419 02/19/23 1929 02/20/23  0802  BP: (!) 106/40 (!) 110/51 (!) 114/40 (!) 140/59  Pulse: 73 76 71 81  Resp: 13 17 18 14   Temp:  99.5 F (37.5 C) 99.1 F (37.3 C) 98.2 F (36.8 C)  TempSrc:  Oral  Oral  SpO2: 94% 93% 94% 97%  Weight:      Height:        Intake/Output Summary (Last 24 hours) at 02/20/2023 1325 Last data filed at 02/20/2023 0800 Gross per 24 hour  Intake 982.28 ml  Output 350 ml  Net 632.28 ml   Filed Weights   02/19/23 0114 02/19/23 0900  Weight: 40.8 kg 40.8 kg    Examination:  General exam: Appears calm and comfortable  Frail and debilitated.  Looks comfortable.  Alert awake and oriented.  Interactive.  Moves all extremities.  Right hip limited due to immediate postop. Respiratory system: Clear to auscultation. Respiratory effort normal. Cardiovascular system: S1 & S2 heard, RRR.  Gastrointestinal system: soft , nontender. Central nervous system: Alert and oriented. No focal neurological deficits. Extremities: Symmetric 5 x 5 power. Skin: Right lateral thigh incision clean and dry.  Incisions intact.   Data Reviewed: I have personally reviewed following labs and imaging studies  CBC: Recent Labs  Lab 02/19/23 0130 02/19/23 0342 02/20/23 0558  WBC 10.6* 9.8 11.2*  NEUTROABS 7.9*  --   --   HGB 9.5* 9.6* 8.1*  HCT 31.8* 32.2* 26.2*  MCV 89.8 90.2 88.5  PLT 285 277 223   Basic Metabolic Panel: Recent Labs  Lab 02/19/23 0130 02/19/23 0342  NA 138 138  K 3.8 3.8  CL 107 106  CO2 20* 20*  GLUCOSE  93 89  BUN 29* 27*  CREATININE 1.78* 1.57*  CALCIUM 9.2 9.2   GFR: Estimated Creatinine Clearance: 18.7 mL/min (A) (by C-G formula based on SCr of 1.57 mg/dL (H)). Liver Function Tests: No results for input(s): "AST", "ALT", "ALKPHOS", "BILITOT", "PROT", "ALBUMIN" in the last 168 hours. No results for input(s): "LIPASE", "AMYLASE" in the last 168 hours. No results for input(s): "AMMONIA" in the last 168 hours. Coagulation Profile: No results for input(s): "INR",  "PROTIME" in the last 168 hours. Cardiac Enzymes: No results for input(s): "CKTOTAL", "CKMB", "CKMBINDEX", "TROPONINI" in the last 168 hours. BNP (last 3 results) No results for input(s): "PROBNP" in the last 8760 hours. HbA1C: No results for input(s): "HGBA1C" in the last 72 hours. CBG: No results for input(s): "GLUCAP" in the last 168 hours. Lipid Profile: No results for input(s): "CHOL", "HDL", "LDLCALC", "TRIG", "CHOLHDL", "LDLDIRECT" in the last 72 hours. Thyroid Function Tests: No results for input(s): "TSH", "T4TOTAL", "FREET4", "T3FREE", "THYROIDAB" in the last 72 hours. Anemia Panel: No results for input(s): "VITAMINB12", "FOLATE", "FERRITIN", "TIBC", "IRON", "RETICCTPCT" in the last 72 hours. Sepsis Labs: No results for input(s): "PROCALCITON", "LATICACIDVEN" in the last 168 hours.  Recent Results (from the past 240 hours)  Surgical pcr screen     Status: None   Collection Time: 02/19/23  8:41 AM   Specimen: Nasal Mucosa; Nasal Swab  Result Value Ref Range Status   MRSA, PCR NEGATIVE NEGATIVE Final   Staphylococcus aureus NEGATIVE NEGATIVE Final    Comment: (NOTE) The Xpert SA Assay (FDA approved for NASAL specimens in patients 46 years of age and older), is one component of a comprehensive surveillance program. It is not intended to diagnose infection nor to guide or monitor treatment. Performed at [facility], [address] [place], [place] [address]          Radiology Studies: DG HIP UNILAT WITH PELVIS 1V RIGHT Result Date: 02/19/2023 CLINICAL DATA:  Elective surgery. EXAM: DG HIP (WITH OR WITHOUT PELVIS) 1V RIGHT COMPARISON:  None Available. FINDINGS: Three fluoroscopic spot views of the pelvis and right hip obtained in the operating room. Images during hip arthroplasty. Fluoroscopy time 19.8 seconds. Dose 1.3 mGy. IMPRESSION: Procedural fluoroscopy during right hip arthroplasty. Electronically Signed   By: Narda Rutherford M.D.   On: 02/19/2023 13:02    DG Pelvis Portable Result Date: 02/19/2023 CLINICAL DATA:  Status post right hip replacement. EXAM: PORTABLE PELVIS 1-2 VIEWS COMPARISON:  None Available. FINDINGS: Right hip arthroplasty in expected alignment. No periprosthetic lucency or fracture. Recent postsurgical change includes air and edema in the soft tissues. Lateral skin staples in place. Previous left hip arthroplasty. IMPRESSION: Right hip arthroplasty without immediate postoperative complication. Electronically Signed   By: Narda Rutherford M.D.   On: 02/19/2023 13:01   DG C-Arm 1-60 Min-No Report Result Date: 02/19/2023 Fluoroscopy was utilized by the requesting physician.  No radiographic interpretation.   Chest Portable 1 View Result Date: 02/19/2023 CLINICAL DATA:  Admission for hip fracture EXAM: PORTABLE CHEST 1 VIEW COMPARISON:  10/12/2013 FINDINGS: Stable cardiomediastinal silhouette. Aortic atherosclerotic calcification. Bronchial wall thickening and diffuse interstitial coarsening. No focal pneumonia. No pleural effusion or pneumothorax. No displaced rib fractures. IMPRESSION: Bronchial wall thickening and diffuse interstitial coarsening which may be due to bronchitis. Electronically Signed   By: Minerva Fester M.D.   On: 02/19/2023 03:56   DG Hip Unilat W or Wo Pelvis 1 View Right Result Date: 02/19/2023 CLINICAL DATA:  Status post fall. EXAM: DG HIP (  WITH OR WITHOUT PELVIS) 1V RIGHT COMPARISON:  February 12, 2023 FINDINGS: There is attacked total left hip replacement. An acute fracture deformity is seen extending through the neck of the proximal right femur. There is no evidence of dislocation. A stable deformity of indeterminate age is seen involving the right inferior pubic ramus. Advanced degenerative changes are noted involving the right hip in the form of joint space narrowing and acetabular sclerosis. IMPRESSION: 1. Acute fracture of the proximal right femur. 2. Stable deformity of the right inferior pubic ramus of  indeterminate age. 3. Advanced degenerative changes of the right hip. 4. Intact total left hip replacement. Electronically Signed   By: Aram Candela M.D.   On: 02/19/2023 01:53        Scheduled Meds:  aspirin  81 mg Oral BID   cholecalciferol  1,000 Units Oral Daily   cyanocobalamin  1,000 mcg Oral Daily   docusate sodium  100 mg Oral BID   feeding supplement  237 mL Oral TID BM   ferrous gluconate  240 mg Oral BID   hydroxychloroquine  200 mg Oral BID   losartan  50 mg Oral Daily   mirtazapine  15 mg Oral QHS   multivitamin with minerals  1 tablet Oral Daily   oxybutynin  5 mg Oral BID   pantoprazole  40 mg Oral Daily   predniSONE  5 mg Oral Daily   pregabalin  75 mg Oral Daily   pyridOXINE  100 mg Oral Daily   RA Probiotic Digestive Care  1 capsule Oral Daily   Continuous Infusions:  sodium chloride 75 mL/hr at 02/19/23 2133     LOS: 1 day    Time spent: 35 minutes    Dorcas Carrow, MD [facility]

## 2023-02-20 NOTE — Progress Notes (Signed)
Subjective: 1 Day Post-Op Procedure(s) (LRB): TOTAL HIP ARTHROPLASTY ANTERIOR APPROACH (Right) Patient reports pain as moderate.    Objective: Vital signs in last 24 hours: Temp:  [98.2 F (36.8 C)-99.1 F (37.3 C)] 98.2 F (36.8 C) (02/14 1416) Pulse Rate:  [71-81] 78 (02/14 1416) Resp:  [14-18] 16 (02/14 1416) BP: (114-140)/(40-59) 138/48 (02/14 1416) SpO2:  [86 %-97 %] 86 % (02/14 1416)  Intake/Output from previous day: 02/13 0701 - 02/14 0700 In: 1465.8 [I.V.:1165.8; IV Piggyback:300] Out: 605 [Urine:530; Blood:75] Intake/Output this shift: Total I/O In: 240 [P.O.:240] Out: -   Recent Labs    02/19/23 0130 02/19/23 0342 02/20/23 0558  HGB 9.5* 9.6* 8.1*   Recent Labs    02/19/23 0342 02/20/23 0558  WBC 9.8 11.2*  RBC 3.57* 2.96*  HCT 32.2* 26.2*  PLT 277 223   Recent Labs    02/19/23 0130 02/19/23 0342  NA 138 138  K 3.8 3.8  CL 107 106  CO2 20* 20*  BUN 29* 27*  CREATININE 1.78* 1.57*  GLUCOSE 93 89  CALCIUM 9.2 9.2   No results for input(s): "LABPT", "INR" in the last 72 hours.  Sensation intact distally Intact pulses distally Dorsiflexion/Plantar flexion intact Incision: dressing C/D/I   Assessment/Plan: 1 Day Post-Op Procedure(s) (LRB): TOTAL HIP ARTHROPLASTY ANTERIOR APPROACH (Right) Up with therapy Will likely need short-term SNF placement like she had after her left hip fracture this past December.    Holly Jimenez 02/20/2023, 3:56 PM

## 2023-02-20 NOTE — Evaluation (Signed)
Occupational Therapy Evaluation Patient Details Name: Holly Jimenez MRN: [id number] DOB: 05-Dec-1945 Today's Date: 02/20/2023   History of Present Illness   Pt is a 78 y/o F s/p R direct anterior total hip arthroplasty s/p fall. PMH includes Raynaud's disease, CKD stage IIIb, DDD, GERD, osteoarthritis, hypertension, peptic ulcer disease ,status post left total hip arthroplasty 12/26/2022     Clinical Impressions Patient admitted for above and presents with problem list below.  PTA she reports having a caregiver 3x/week for 2 hours, who assists with IADLs, showers but pt is able to manage dressing and mobilizes with RW with modified independence. She is limited by pain today, able to reposition in bed with min-mod assist but ultimately declines OOB as she is resistive to movement when attempted.  She requires setup to total assist for ADLs.  Based on performance today, believe patient will best benefit from continued OT services acutely and after dc at inpatient setting with <3hrs/day to optimize independence, safety with ADLs and mobility.       If plan is discharge home, recommend the following:   Two people to help with walking and/or transfers;A lot of help with bathing/dressing/bathroom;Assistance with cooking/housework;Help with stairs or ramp for entrance     Functional Status Assessment   Patient has had a recent decline in their functional status and demonstrates the ability to make significant improvements in function in a reasonable and predictable amount of time.     Equipment Recommendations   Other (comment) (defer)     Recommendations for Other Services         Precautions/Restrictions   Precautions Precautions: Fall Recall of Precautions/Restrictions: Intact Restrictions Weight Bearing Restrictions Per Provider Order: Yes RLE Weight Bearing Per Provider Order: Weight bearing as tolerated     Mobility Bed Mobility Overal bed mobility:  Needs Assistance Bed Mobility: Rolling Rolling: Mod assist         General bed mobility comments: min assist towards L side, mod assist towards R side to roll and change bed pad.  Mod assist to pull self up in bed.  Attempted movement towards EOB but pt resists movement as soon as attempted.    Transfers                   General transfer comment: deferred      Balance                                           ADL either performed or assessed with clinical judgement   ADL Overall ADL's : Needs assistance/impaired     Grooming: Set up;Bed level           Upper Body Dressing : Set up;Bed level   Lower Body Dressing: Total assistance;Bed level     Toilet Transfer Details (indicate cue type and reason): deferred         Functional mobility during ADLs: Moderate assistance General ADL Comments: limited to bed level, pt resisting movement to EOB after voiced she wanted to try to get OOB for lunch     Vision   Vision Assessment?: No apparent visual deficits     Perception         Praxis         Pertinent Vitals/Pain Pain Assessment Pain Assessment: Faces Faces Pain Scale: Hurts whole lot Pain Location: R hip Pain Descriptors / Indicators: Discomfort,  Grimacing, Sore, Operative site guarding Pain Intervention(s): Limited activity within patient's tolerance, Monitored during session, Repositioned, Patient requesting pain meds-RN notified     Extremity/Trunk Assessment Upper Extremity Assessment Upper Extremity Assessment: Generalized weakness   Lower Extremity Assessment Lower Extremity Assessment: Defer to PT evaluation RLE: Unable to fully assess due to pain   Cervical / Trunk Assessment Cervical / Trunk Assessment: Kyphotic   Communication Communication Communication: No apparent difficulties   Cognition Arousal: Alert Behavior During Therapy: WFL for tasks assessed/performed Cognition: No apparent impairments                                Following commands: Intact       Cueing  General Comments   Cueing Techniques: Verbal cues      Exercises     Shoulder Instructions      Home Living Family/patient expects to be discharged to:: Private residence Living Arrangements: Alone Available Help at Discharge: Available PRN/intermittently (caregiver 2 hours/day for 3 days a week) Type of Home: House Home Access: Stairs to enter [employer] of Steps: 3 Entrance Stairs-Rails: Right Home Layout: One level     Bathroom Shower/Tub: Chief Strategy Officer: Standard Bathroom Accessibility: Yes   Home Equipment: Cane - single point;Rolling Walker (2 wheels);Shower seat;BSC/3in1   Additional Comments: pt reports being mod I with RW and was having HHPT prior to admission (had completed HHOT)      Prior Functioning/Environment Prior Level of Function : Needs assist             Mobility Comments: using RW ADLs Comments: caregiver- meals, light housework, showers; pt able to self dress    OT Problem List: Decreased strength;Decreased activity tolerance;Impaired balance (sitting and/or standing);Pain;Decreased knowledge of use of DME or AE;Decreased knowledge of precautions   OT Treatment/Interventions: Self-care/ADL training;Therapeutic exercise;DME and/or AE instruction;Therapeutic activities;Cognitive remediation/compensation;Patient/family education;Balance training      OT Goals(Current goals can be found in the care plan section)   Acute Rehab OT Goals Patient Stated Goal: home OT Goal Formulation: With patient Time For Goal Achievement: 03/06/23 Potential to Achieve Goals: Good   OT Frequency:  Min 1X/week    Co-evaluation              AM-PAC OT "6 Clicks" Daily Activity     Outcome Measure Help from another person eating meals?: None Help from another person taking care of personal grooming?: A Little Help from another person  toileting, which includes using toliet, bedpan, or urinal?: A Lot Help from another person bathing (including washing, rinsing, drying)?: A Lot Help from another person to put on and taking off regular upper body clothing?: A Little Help from another person to put on and taking off regular lower body clothing?: Total 6 Click Score: 15   End of Session Nurse Communication: Mobility status;Patient requests pain meds  Activity Tolerance: Patient limited by pain Patient left: in bed;with call bell/phone within reach;with bed alarm set  OT Visit Diagnosis: Other abnormalities of gait and mobility (R26.89);Muscle weakness (generalized) (M62.81);Pain;History of falling (Z91.81) Pain - Right/Left: Right Pain - part of body: Hip                Time: 1206-1230 OT Time Calculation (min): 24 min Charges:  OT General Charges $OT Visit: 1 Visit OT Evaluation $OT Eval Moderate Complexity: 1 Mod OT Treatments $Self Care/Home Management : 8-22 mins  Lorene Dy B, OT Acute  Rehabilitation Services Office [phone]   Chancy Milroy 02/20/2023, 1:36 PM

## 2023-02-20 NOTE — Evaluation (Signed)
 Physical Therapy Evaluation Patient Details Name: Holly Jimenez MRN: [id number] DOB: 03-05-1945 Today's Date: 02/20/2023  History of Present Illness  Pt is a 78 y/o F s/p R direct anterior total hip arthroplasty s/p fall. PMH includes Raynaud's disease, CKD stage IIIb, DDD, GERD, osteoarthritis, hypertension, peptic ulcer disease ,status post left total hip arthroplasty 12/26/2022  Clinical Impression  Received pt semi-reclined in bed and agreeable to attempt to participate in PT eval despite 10/10 pain (premedicated). Pt required max A for bed mobility with HOB elevated and use of bed features with cues for sequencing. Pt with posterior lean in sitting (suspect due to pain) and required max A for sitting balance. Stood from EOB with RW and mod A, but pt unable to advance BLEs forward to take steps. Pt able to side step to R HOB with RW and min A and returned to supine. Pt crying, yelling, and moaning in pain throughout session. Pt reports living alone in 1 level home with 3 STE and R handrail. Pt reports having "no one" to assist at home, only an aide for 2 hours 3 days/week. Recommend continued PT services in less intensive setting <3hrs/day to work on strength, balance, endurance, and pain management. Acute PT to cont to follow.       If plan is discharge home, recommend the following: A lot of help with walking and/or transfers;A lot of help with bathing/dressing/bathroom;Assistance with cooking/housework;Assist for transportation;Help with stairs or ramp for entrance   Can travel by private vehicle   No    Equipment Recommendations Other (comment) (TBD in next venue)  Recommendations for Other Services       Functional Status Assessment Patient has had a recent decline in their functional status and demonstrates the ability to make significant improvements in function in a reasonable and predictable amount of time.     Precautions / Restrictions Precautions Precautions:  Fall Restrictions Weight Bearing Restrictions Per Provider Order: No      Mobility  Bed Mobility Overal bed mobility: Needs Assistance Bed Mobility: Rolling, Supine to Sit, Sit to Supine Rolling: Min assist   Supine to sit: Max assist, HOB elevated, Used rails Sit to supine: Max assist   General bed mobility comments: Pt required cues for sequencing with rolling and for hand placement on bedrail. Pt required assist for BLE management and trunk control and ultilized helicopter method to transition to EOB. Pt unable to tolerate hip flexion and pushing posteriorly due to pain, requiring max A for sitting balance.    Transfers Overall transfer level: Needs assistance Equipment used: Rolling walker (2 wheels) Transfers: Sit to/from Stand Sit to Stand: Mod assist           General transfer comment: Stood from EOB with RW and mod A, but unable to advance and step forward due to pain in R hip. Performed R lateral side stepping to [facility] with RW and min A.    Ambulation/Gait Ambulation/Gait assistance: Min assist Gait Distance (Feet): 4 Feet Assistive device: Rolling walker (2 wheels) Gait Pattern/deviations: Step-to pattern Gait velocity: decreased Gait velocity interpretation: <1.31 ft/sec, indicative of household ambulator   General Gait Details: side stepping to [facility]  Stairs            Wheelchair Mobility     Tilt Bed    Modified Rankin (Stroke Patients Only)       Balance Overall balance assessment: Needs assistance Sitting-balance support: Feet supported, Bilateral upper extremity supported Sitting balance-Leahy Scale: Poor Sitting balance -  Comments: pt pushing posteriorly due to pain and required max A for sitting balance Postural control: Posterior lean Standing balance support: Bilateral upper extremity supported, During functional activity, Reliant on assistive device for balance (RW) Standing balance-Leahy Scale: Poor Standing balance comment: pt  required min A for static and dynamic standing balance - limited by pain in R hip                             Pertinent Vitals/Pain Pain Assessment Pain Assessment: Faces Faces Pain Scale: Hurts worst Pain Location: R hip Pain Descriptors / Indicators: Crying, Discomfort, Grimacing, Guarding, Moaning, Sore Pain Intervention(s): Limited activity within patient's tolerance, Monitored during session, Premedicated before session, Repositioned, Ice applied    Home Living Family/patient expects to be discharged to:: Private residence Living Arrangements: Alone Available Help at Discharge: Available PRN/intermittently (pt has caregiver that she hires for 2 hours/day for 3 days/week) Type of Home: House Home Access: Stairs to enter Entrance Stairs-Rails: Right Entrance Stairs-Number of Steps: 3   Home Layout: One level Home Equipment: Cane - single Librarian, academic (2 wheels);Shower seat;BSC/3in1 Additional Comments: pt reports being mod I with RW and was having HHPT prior to admission    Prior Function Prior Level of Function : Independent/Modified Independent                     Extremity/Trunk Assessment   Upper Extremity Assessment Upper Extremity Assessment: Defer to OT evaluation    Lower Extremity Assessment Lower Extremity Assessment: Generalized weakness;RLE deficits/detail RLE: Unable to fully assess due to pain    Cervical / Trunk Assessment Cervical / Trunk Assessment: Kyphotic  Communication   Communication Communication: No apparent difficulties    Cognition Arousal: Alert Behavior During Therapy: WFL for tasks assessed/performed   PT - Cognitive impairments: No apparent impairments                         Following commands: Intact       Cueing Cueing Techniques: Verbal cues     General Comments General comments (skin integrity, edema, etc.): pt crying and moaning with mobility due to severe pain.    Exercises      Assessment/Plan    PT Assessment Patient needs continued PT services  PT Problem List Decreased strength;Decreased range of motion;Decreased activity tolerance;Decreased balance;Decreased mobility;Decreased coordination;Decreased safety awareness;Decreased knowledge of use of DME;Decreased cognition;Pain       PT Treatment Interventions DME instruction;Neuromuscular re-education;Gait training;Stair training;Patient/family education;Functional mobility training;Wheelchair mobility training;Therapeutic activities;Manual techniques;Therapeutic exercise;Modalities;Balance training    PT Goals (Current goals can be found in the Care Plan section)  Acute Rehab PT Goals Patient Stated Goal: to go to another rehab facility PT Goal Formulation: With patient Time For Goal Achievement: 03/06/23 Potential to Achieve Goals: Good    Frequency Min 1X/week     Co-evaluation               AM-PAC PT "6 Clicks" Mobility  Outcome Measure Help needed turning from your back to your side while in a flat bed without using bedrails?: A Lot Help needed moving from lying on your back to sitting on the side of a flat bed without using bedrails?: A Lot Help needed moving to and from a bed to a chair (including a wheelchair)?: A Lot Help needed standing up from a chair using your arms (e.g., wheelchair or bedside chair)?: A Lot Help needed  to walk in hospital room?: A Lot Help needed climbing 3-5 steps with a railing? : Total 6 Click Score: 11    End of Session Equipment Utilized During Treatment: Gait belt Activity Tolerance: Patient limited by pain Patient left: in bed;with call bell/phone within reach;with bed alarm set Nurse Communication: Mobility status PT Visit Diagnosis: Unsteadiness on feet (R26.81);Other abnormalities of gait and mobility (R26.89);Repeated falls (R29.6);Muscle weakness (generalized) (M62.81);History of falling (Z91.81);Pain Pain - Right/Left: Right Pain - part of body:  Hip    Time: 1610-9604 PT Time Calculation (min) (ACUTE ONLY): 23 min   Charges:   PT Evaluation $PT Eval Moderate Complexity: 1 Mod PT Treatments $Therapeutic Activity: 8-22 mins PT General Charges $$ ACUTE PT VISIT: 1 Visit         Blima Rich PT, DPT [facility] Zaunegger 02/20/2023, 12:16 PM

## 2023-02-20 NOTE — Progress Notes (Signed)
Transition of Care [facility]) - CAGE-AID Screening   Patient Details  Name: Holly Jimenez MRN: [id number] Date of Birth: 19-Feb-1945   Hewitt Shorts, RN Trauma Response Nurse Phone Number: [phone] 02/20/2023, 1:37 PM    CAGE-AID Screening:    Have You Ever Felt You Ought to Cut Down on Your Drinking or Drug Use?: No Have People Annoyed You By Critizing Your Drinking Or Drug Use?: No Have You Felt Bad Or Guilty About Your Drinking Or Drug Use?: No Have You Ever Had a Drink or Used Drugs First Thing In The Morning to Steady Your Nerves or to Get Rid of a Hangover?: No CAGE-AID Score: 0  Substance Abuse Education Offered: (S) No (Denies alcohol use, services declined)

## 2023-02-21 DIAGNOSIS — S72001A Fracture of unspecified part of neck of right femur, initial encounter for closed fracture: Secondary | ICD-10-CM | POA: Diagnosis not present

## 2023-02-21 LAB — CBC
HCT: 31.2 % — ABNORMAL LOW (ref 36.0–46.0)
Hemoglobin: 9.4 g/dL — ABNORMAL LOW (ref 12.0–15.0)
MCH: 26.6 pg (ref 26.0–34.0)
MCHC: 30.1 g/dL (ref 30.0–36.0)
MCV: 88.1 fL (ref 80.0–100.0)
Platelets: 225 10*3/uL (ref 150–400)
RBC: 3.54 MIL/uL — ABNORMAL LOW (ref 3.87–5.11)
RDW: 13.7 % (ref 11.5–15.5)
WBC: 10.7 10*3/uL — ABNORMAL HIGH (ref 4.0–10.5)
nRBC: 0 % (ref 0.0–0.2)

## 2023-02-21 LAB — BASIC METABOLIC PANEL
Anion gap: 9 (ref 5–15)
BUN: 20 mg/dL (ref 8–23)
CO2: 21 mmol/L — ABNORMAL LOW (ref 22–32)
Calcium: 9 mg/dL (ref 8.9–10.3)
Chloride: 107 mmol/L (ref 98–111)
Creatinine, Ser: 1.21 mg/dL — ABNORMAL HIGH (ref 0.44–1.00)
GFR, Estimated: 46 mL/min — ABNORMAL LOW (ref >60)
Glucose, Bld: 92 mg/dL (ref 70–99)
Potassium: 5 mmol/L (ref 3.5–5.1)
Sodium: 137 mmol/L (ref 135–145)

## 2023-02-21 NOTE — [place] FL2 (Signed)
 [place] MEDICA[place] FL2 LEVEL OF CARE FORM     IDENTIFICATION  Patient Name: Holly Jimenez Birthdate: 1945/09/05 Sex: female Admission Date (Current Location): 02/19/2023  [facility] and [place] Number:  Producer, television/film/video and Address:  [facility], [address] [place], [place] [address]      Provider Number: [id number]  Attending Physician Name and Address:  Dorcas Carrow, MD  Relative Name and Phone Number:       Current Level of Care: Hospital Recommended Level of Care: Skilled Nursing Facility Prior Approval Number:    Date Approved/Denied:   PASRR Number: [id number]  Discharge Plan: SNF    Current Diagnoses: Patient Active Problem List   Diagnosis Date Noted   Hip fracture (HCC) 02/19/2023   Closed fracture of neck of right femur (HCC) 12/25/2022   Protein-calorie malnutrition, severe 12/25/2022   Acute pain of left hip 12/24/2022   CKD stage 3b, GFR 30-44 ml/min (HCC) 12/24/2022   Abnormal TSH 05/31/2019   Gait abnormality 05/30/2019   Paresthesia 05/30/2019   Low back pain without sciatica 05/30/2019   Subdural hematoma (HCC) 11/05/2016   Compression fracture of body of thoracic vertebra (HCC) 03/08/2014   Neuropathy 02/16/2014   Ulcer of the stomach and intestine 02/16/2014   Raynaud disease 02/16/2014   Abnormal ECG 02/16/2014   Essential hypertension 02/16/2014   Abnormal EKG 02/16/2014   NONSPECIFIC ABN FINDING RAD & OTH EXAM GI TRACT 08/22/2008    Orientation RESPIRATION BLADDER Height & Weight     Self, Time, Place, Situation  Normal Continent Weight: 90 lb (40.8 kg) Height:  5\' 1"  (154.9 cm)  BEHAVIORAL SYMPTOMS/MOOD NEUROLOGICAL BOWEL NUTRITION STATUS      Continent Diet (see dc summary)  AMBULATORY STATUS COMMUNICATION OF NEEDS Skin   Extensive Assist Verbally Other (Comment) (Incision - Hip Right)                       Personal Care Assistance Level of Assistance  Feeding, Dressing, Bathing  Bathing Assistance: Maximum assistance Feeding assistance: Limited assistance Dressing Assistance: Maximum assistance     Functional Limitations Info  Sight, Speech, Hearing Sight Info: Adequate Hearing Info: Adequate Speech Info: Adequate    SPECIAL CARE FACTORS FREQUENCY  PT (By licensed PT), OT (By licensed OT)     PT Frequency: 5x week OT Frequency: 5x week            Contractures Contractures Info: Not present    Additional Factors Info  Code Status, Allergies Code Status Info: Full Allergies Info: Sulfa Antibiotics, Sulfonamide Derivatives, Methocarbamol, Pancrelipase (Lip-prot-amyl), Sulfacetamide Sodium-sulfur           Current Medications (02/21/2023):  This is the current hospital active medication list Current Facility-Administered Medications  Medication Dose Route Frequency Provider Last Rate Last Admin   acetaminophen (TYLENOL) tablet 325-650 mg  325-650 mg Oral Q6H PRN Kathryne Hitch, MD       acidophilus (RISAQUAD) capsule 1 capsule  1 capsule Oral Daily Dorcas Carrow, MD       alum & mag hydroxide-simeth (MAALOX/MYLANTA) 200-200-20 MG/5ML suspension 30 mL  30 mL Oral Q4H PRN Kathryne Hitch, MD       aspirin chewable tablet 81 mg  81 mg Oral BID Kathryne Hitch, MD   81 mg at 02/20/23 2248   cholecalciferol (VITAMIN D3) 25 MCG (1000 UNIT) tablet 1,000 Units  1,000 Units Oral Daily Dorcas Carrow, MD   1,000 Units  at 02/20/23 1535   cyanocobalamin (VITAMIN B12) tablet 1,000 mcg  1,000 mcg Oral Daily Dorcas Carrow, MD   1,000 mcg at 02/20/23 1535   diphenhydrAMINE (BENADRYL) 12.5 MG/5ML elixir 12.5-25 mg  12.5-25 mg Oral Q4H PRN Kathryne Hitch, MD       docusate sodium (COLACE) capsule 100 mg  100 mg Oral BID Kathryne Hitch, MD   100 mg at 02/20/23 2248   feeding supplement (ENSURE ENLIVE / ENSURE PLUS) liquid 237 mL  237 mL Oral TID BM Dorcas Carrow, MD   237 mL at 02/20/23 0953   ferrous gluconate (FERGON)  tablet 324 mg  324 mg Oral BID Dorcas Carrow, MD   324 mg at 02/20/23 2248   hydrALAZINE (APRESOLINE) injection 10 mg  10 mg Intravenous Q6H PRN Kathryne Hitch, MD       HYDROcodone-acetaminophen (NORCO) 7.5-325 MG per tablet 1-2 tablet  1-2 tablet Oral Q4H PRN Kathryne Hitch, MD   2 tablet at 02/19/23 1759   HYDROcodone-acetaminophen (NORCO/VICODIN) 5-325 MG per tablet 1-2 tablet  1-2 tablet Oral Q4H PRN Kathryne Hitch, MD   2 tablet at 02/21/23 5409   HYDROmorphone (DILAUDID) injection 0.25 mg  0.25 mg Intravenous Q2H PRN Anthoney Harada, NP   0.25 mg at 02/20/23 2100   hydroxychloroquine (PLAQUENIL) tablet 200 mg  200 mg Oral BID Dorcas Carrow, MD   200 mg at 02/20/23 2248   losartan (COZAAR) tablet 50 mg  50 mg Oral Daily Ghimire, Lyndel Safe, MD       menthol-cetylpyridinium (CEPACOL) lozenge 3 mg  1 lozenge Oral PRN Kathryne Hitch, MD       Or   phenol (CHLORASEPTIC) mouth spray 1 spray  1 spray Mouth/Throat PRN Kathryne Hitch, MD       metoCLOPramide (REGLAN) tablet 5-10 mg  5-10 mg Oral Q8H PRN Kathryne Hitch, MD       Or   metoCLOPramide (REGLAN) injection 5-10 mg  5-10 mg Intravenous Q8H PRN Kathryne Hitch, MD       mirtazapine (REMERON) tablet 15 mg  15 mg Oral QHS Dorcas Carrow, MD   15 mg at 02/20/23 2248   multivitamin with minerals tablet 1 tablet  1 tablet Oral Daily Dorcas Carrow, MD   1 tablet at 02/20/23 0953   ondansetron (ZOFRAN) tablet 4 mg  4 mg Oral Q6H PRN Kathryne Hitch, MD       Or   ondansetron [facility]) injection 4 mg  4 mg Intravenous Q6H PRN Kathryne Hitch, MD       oxybutynin (DITROPAN) tablet 5 mg  5 mg Oral BID Kathryne Hitch, MD   5 mg at 02/20/23 2248   pantoprazole (PROTONIX) EC tablet 40 mg  40 mg Oral Daily Kathryne Hitch, MD   40 mg at 02/20/23 0945   polyethylene glycol (MIRALAX / GLYCOLAX) packet 17 g  17 g Oral Daily PRN Kathryne Hitch, MD   17 g at  02/20/23 2254   predniSONE (DELTASONE) tablet 5 mg  5 mg Oral Daily Dorcas Carrow, MD   5 mg at 02/20/23 1535   pregabalin (LYRICA) capsule 75 mg  75 mg Oral Daily Dorcas Carrow, MD   75 mg at 02/20/23 1535   pyridOXINE (VITAMIN B6) tablet 100 mg  100 mg Oral Daily Dorcas Carrow, MD   100 mg at 02/20/23 1534     Discharge Medications: Please see discharge summary for a list of discharge  medications.  Relevant Imaging Results:  Relevant Lab Results:   Additional Information SS# [id number] [place], [place]

## 2023-02-21 NOTE — Progress Notes (Signed)
 O2 sats 89% on room air at rest, improved to 97% on 2L [place]. No c/o SOB or cough, lung sounds clear. Encouraged IS, notified MD.

## 2023-02-21 NOTE — Plan of Care (Signed)

## 2023-02-21 NOTE — Progress Notes (Signed)
 PROGRESS NOTE    Holly Jimenez  XBJ:[id number] DOB: 11-30-1945 DOA: 02/19/2023 PCP: Irena Reichmann, DO    Brief Narrative:  78 year old with history of Raynaud's disease, CKD stage IIIb, GERD, osteoarthritis, hypertension, left total hip 12/2022 presented with mechanical fall and right hip pain.  She was found to have acute right proximal femoral fracture.  Underwent ORIF 2/13.  Subjective:  Patient seen and examined.  Low-grade temperature noted in the morning vitals.  Patient denies any cough cold chills or URI symptoms.  Pain is controlled.  Tylenol given.  Will monitor.   Assessment & Plan:   Closed traumatic right hip fracture: Status post ORIF Dr. Magnus Ivan 2/13-anterior approach total hip arthroplasty. Weightbearing as tolerated Pain management, oral opiates and Tylenol. DVT prophylaxis, aspirin 81 mg twice daily. Work with PT OT today.  Refer to SNF for rehab.  Anemia of acute blood loss: chronic underlying anemia.  Baseline hemoglobin 10-11. Hemoglobin 8.1.  Presented with hemoglobin 9.5.  Expected blood loss.  Also with chronic anemia.  Currently no evidence of active bleeding.  Monitor.  Will prescribe iron on discharge.  Chronic medical history including CKD stage IIIb: At about baseline.  Creatinine improved to 1.2. Raynaud's disease: On Plaquenil.  Maintenance prednisone 5 mg daily.  Continue.  Continued.  Patient also on chronic pain management with Lyrica.  This is continued. GERD: On PPI. Hypertension: On losartan at home.  Resume.  Is stable.    DVT prophylaxis: SCDs Start: 02/19/23 1439 SCDs Start: 02/19/23 0315   Code Status: Full code Family Communication: None at the bedside Disposition Plan: Status is: Inpatient Remains inpatient appropriate because: Postop.  Stable for SNF placement.     Consultants:  Orthopedics  Procedures:  ORIF right hip  Antimicrobials:  None     Objective: Vitals:   02/21/23 0600 02/21/23 0819  02/21/23 1156 02/21/23 1200  BP: (!) 153/56 (!) 153/64 (!) 129/55   Pulse: 78 79 73   Resp: 16 17 18    Temp: 98.9 F (37.2 C) (!) 101.7 F (38.7 C) 98.6 F (37 C)   TempSrc: Oral Oral Oral   SpO2: 98% 90% 91% 95%  Weight:      Height:        Intake/Output Summary (Last 24 hours) at 02/21/2023 1241 Last data filed at 02/20/2023 1700 Gross per 24 hour  Intake 480 ml  Output --  Net 480 ml   Filed Weights   02/19/23 0114 02/19/23 0900  Weight: 40.8 kg 40.8 kg    Examination:  General exam: Appears calm and comfortable.  Frail. Respiratory system: Clear to auscultation. Respiratory effort normal. Cardiovascular system: S1 & S2 heard, RRR.  Gastrointestinal system: soft , nontender. Central nervous system: Alert and oriented. No focal neurological deficits. Extremities: Symmetric 5 x 5 power. Skin: Right lateral thigh incision clean and dry.  Incisions intact.   Data Reviewed: I have personally reviewed following labs and imaging studies  CBC: Recent Labs  Lab 02/19/23 0130 02/19/23 0342 02/20/23 0558 02/21/23 0550  WBC 10.6* 9.8 11.2* 10.7*  NEUTROABS 7.9*  --   --   --   HGB 9.5* 9.6* 8.1* 9.4*  HCT 31.8* 32.2* 26.2* 31.2*  MCV 89.8 90.2 88.5 88.1  PLT 285 277 223 225   Basic Metabolic Panel: Recent Labs  Lab 02/19/23 0130 02/19/23 0342 02/21/23 0550  NA 138 138 137  K 3.8 3.8 5.0  CL 107 106 107  CO2 20* 20* 21*  GLUCOSE 93 89 92  BUN 29* 27* 20  CREATININE 1.78* 1.57* 1.21*  CALCIUM 9.2 9.2 9.0   GFR: Estimated Creatinine Clearance: 24.3 mL/min (A) (by C-G formula based on SCr of 1.21 mg/dL (H)). Liver Function Tests: No results for input(s): "AST", "ALT", "ALKPHOS", "BILITOT", "PROT", "ALBUMIN" in the last 168 hours. No results for input(s): "LIPASE", "AMYLASE" in the last 168 hours. No results for input(s): "AMMONIA" in the last 168 hours. Coagulation Profile: No results for input(s): "INR", "PROTIME" in the last 168 hours. Cardiac  Enzymes: No results for input(s): "CKTOTAL", "CKMB", "CKMBINDEX", "TROPONINI" in the last 168 hours. BNP (last 3 results) No results for input(s): "PROBNP" in the last 8760 hours. HbA1C: No results for input(s): "HGBA1C" in the last 72 hours. CBG: No results for input(s): "GLUCAP" in the last 168 hours. Lipid Profile: No results for input(s): "CHOL", "HDL", "LDLCALC", "TRIG", "CHOLHDL", "LDLDIRECT" in the last 72 hours. Thyroid Function Tests: No results for input(s): "TSH", "T4TOTAL", "FREET4", "T3FREE", "THYROIDAB" in the last 72 hours. Anemia Panel: No results for input(s): "VITAMINB12", "FOLATE", "FERRITIN", "TIBC", "IRON", "RETICCTPCT" in the last 72 hours. Sepsis Labs: No results for input(s): "PROCALCITON", "LATICACIDVEN" in the last 168 hours.  Recent Results (from the past 240 hours)  Surgical pcr screen     Status: None   Collection Time: 02/19/23  8:41 AM   Specimen: Nasal Mucosa; Nasal Swab  Result Value Ref Range Status   MRSA, PCR NEGATIVE NEGATIVE Final   Staphylococcus aureus NEGATIVE NEGATIVE Final    Comment: (NOTE) The Xpert SA Assay (FDA approved for NASAL specimens in patients 23 years of age and older), is one component of a comprehensive surveillance program. It is not intended to diagnose infection nor to guide or monitor treatment. Performed at [facility], [address] [place], [place] [address]          Radiology Studies: No results found.       Scheduled Meds:  acidophilus  1 capsule Oral Daily   aspirin  81 mg Oral BID   cholecalciferol  1,000 Units Oral Daily   cyanocobalamin  1,000 mcg Oral Daily   docusate sodium  100 mg Oral BID   feeding supplement  237 mL Oral TID BM   ferrous gluconate  324 mg Oral BID   hydroxychloroquine  200 mg Oral BID   losartan  50 mg Oral Daily   mirtazapine  15 mg Oral QHS   multivitamin with minerals  1 tablet Oral Daily   oxybutynin  5 mg Oral BID   pantoprazole  40 mg Oral Daily    predniSONE  5 mg Oral Daily   pregabalin  75 mg Oral Daily   pyridOXINE  100 mg Oral Daily   Continuous Infusions:     LOS: 2 days    Time spent: 35 minutes    Dorcas Carrow, MD [facility]

## 2023-02-21 NOTE — Progress Notes (Signed)
   02/21/23 0819  Assess: MEWS Score  Temp (!) 101.7 F (38.7 C)  BP (!) 153/64  MAP (mmHg) 89  Pulse Rate 79  Resp 17  SpO2 90 %  O2 Device Room Air  Assess: MEWS Score  MEWS Temp 2  MEWS Systolic 0  MEWS Pulse 0  MEWS RR 0  MEWS LOC 0  MEWS Score 2  MEWS Score Color Yellow  Assess: if the MEWS score is Yellow or Red  Were vital signs accurate and taken at a resting state? Yes  Does the patient meet 2 or more of the SIRS criteria? No  MEWS guidelines implemented  Yes, yellow  Treat  MEWS Interventions Considered administering scheduled or prn medications/treatments as ordered  Take Vital Signs  Increase Vital Sign Frequency  Yellow: Q2hr x1, continue Q4hrs until patient remains green for 12hrs  Escalate  MEWS: Escalate Yellow: Discuss with charge nurse and consider notifying provider and/or RRT  Notify: Charge Nurse/RN  Name of Charge Nurse/RN Notified Angelito, RN  Provider Notification  Provider Name/Title Ghimire, MD  Date Provider Notified 02/21/23  Time Provider Notified [phone]  Method of Notification Page (secure chatr)  Notification Reason Change in status (new fever)  Provider response See new orders  Assess: SIRS CRITERIA  SIRS Temperature  1  SIRS Respirations  0  SIRS Pulse 0  SIRS WBC 0  SIRS Score Sum  1   New fever, encouraged incentive spirometer and gave 650 acetaminophen. Notified MD

## 2023-02-21 NOTE — TOC Initial Note (Signed)
 Transition of Care [facility]) - Initial/Assessment Note    Patient Details  Name: Holly Jimenez MRN: [id number] Date of Birth: 1945-01-23  Transition of Care [facility] CM/SW Contact:    Michaela Corner, LCSWA Phone Number: 02/21/2023, 9:21 AM  Clinical Narrative:     CSW spoke with pt and introduced self/role. CSW asked pt about PT recs for STR. Pt states she would  like to go to [facility] (1st choice) or [facility] (2nd choice). CSW asked pt if it is okay to fax her out to other facilities to give her other options in the event [facility] cannot offer a bed. Pt stated it is okay but she really wants to go to either of those facilities. CSW will fax pt out at this time.   TOC will continue to follow.   Expected Discharge Plan: Skilled Nursing Facility Barriers to Discharge: Continued Medical Work up, SNF Pending bed offer, Insurance Authorization   Patient Goals and CMS Choice Patient states their goals for this hospitalization and ongoing recovery are:: Go to SNF STR          Expected Discharge Plan and Services In-house Referral: Clinical Social Work     Living arrangements for the past 2 months: Single Family Home                                      Prior Living Arrangements/Services Living arrangements for the past 2 months: Single Family Home Lives with:: Self Patient language and need for interpreter reviewed:: Yes Do you feel safe going back to the place where you live?: Yes      Need for Family Participation in Patient Care: No (Comment) Care giver support system in place?: Yes (comment)   Criminal Activity/Legal Involvement Pertinent to Current Situation/Hospitalization: No - Comment as needed  Activities of Daily Living   ADL Screening (condition at time of admission) Independently performs ADLs?: Yes (appropriate for developmental age) Is the patient deaf or have difficulty hearing?: No Does the patient have difficulty seeing, even when  wearing glasses/contacts?: No Does the patient have difficulty concentrating, remembering, or making decisions?: No  Permission Sought/Granted                  Emotional Assessment Appearance:: Appears stated age Attitude/Demeanor/Rapport: Engaged Affect (typically observed): Calm, Pleasant Orientation: : Oriented to Self, Oriented to Place, Oriented to  Time, Oriented to Situation Alcohol / Substance Use: Not Applicable Psych Involvement: No (comment)  Admission diagnosis:  Hip fracture (HCC) [S72.009A] AKI (acute kidney injury) (HCC) [N17.9] Closed fracture of right hip, initial encounter (HCC) [S72.001A] Patient Active Problem List   Diagnosis Date Noted   Hip fracture (HCC) 02/19/2023   Closed fracture of neck of right femur (HCC) 12/25/2022   Protein-calorie malnutrition, severe 12/25/2022   Acute pain of left hip 12/24/2022   CKD stage 3b, GFR 30-44 ml/min (HCC) 12/24/2022   Abnormal TSH 05/31/2019   Gait abnormality 05/30/2019   Paresthesia 05/30/2019   Low back pain without sciatica 05/30/2019   Subdural hematoma (HCC) 11/05/2016   Compression fracture of body of thoracic vertebra (HCC) 03/08/2014   Neuropathy 02/16/2014   Ulcer of the stomach and intestine 02/16/2014   Raynaud disease 02/16/2014   Abnormal ECG 02/16/2014   Essential hypertension 02/16/2014   Abnormal EKG 02/16/2014   NONSPECIFIC ABN FINDING RAD & OTH EXAM GI TRACT 08/22/2008   PCP:  Thomasena Edis,  Annabelle Harman, DO Pharmacy:   [facility] - [place], [place] - [address] [place] [address] Phone: [phone] Fax: [phone]  [id number] [place], [place] - [address] AT [facility] [address] [place] [address] Phone: [phone] Fax: [phone]  [id number] [place], [place] - [address] [place] [address] Phone: [phone] Fax: [phone]     Social  Drivers of Health (SDOH) Social History: SDOH Screenings   Food Insecurity: No Food Insecurity (02/19/2023)  Housing: Low Risk  (02/19/2023)  Transportation Needs: No Transportation Needs (02/19/2023)  Utilities: Not At Risk (02/19/2023)  Depression (PHQ2-9): Low Risk  (10/23/2022)  Social Connections: Moderately Isolated (02/19/2023)  Tobacco Use: Medium Risk (02/19/2023)   SDOH Interventions:     Readmission Risk Interventions     No data to display

## 2023-02-21 NOTE — Progress Notes (Signed)
 Physical Therapy Treatment Patient Details Name: Holly Jimenez MRN: [id number] DOB: 15-Aug-1945 Today's Date: 02/21/2023   History of Present Illness Pt is a 78 y/o F s/p R direct anterior total hip arthroplasty s/p fall. PMH includes Raynaud's disease, CKD stage IIIb, DDD, GERD, osteoarthritis, hypertension, Lupus, peptic ulcer disease, status post left total hip arthroplasty 12/26/2022.    PT Comments  Pt received in supine after RN premedicated her for pain, pt pleasantly agreeable to therapy session. She demonstrates improved tolerance for transfer and pre-gait training at bedside this date, but still limited due to pain in RLE and needing up to [facility] for simulated step pivot transfer and min to modA for STS and bed mobility with Stedy vs RW. RN/NT instructed on safety for return transfer and pt agreeable to sit up in chair for 1-2 hours at end of session. Pt will continue to benefit from skilled rehab in a post acute setting < 3 hours per day to maximize functional gains before returning home.     If plan is discharge home, recommend the following: A lot of help with walking and/or transfers;A lot of help with bathing/dressing/bathroom;Assistance with cooking/housework;Assist for transportation;Help with stairs or ramp for entrance   Can travel by private vehicle     No  Equipment Recommendations  Other (comment) (TBD in next venue, currently she would need wheelchair, hospital bed)    Recommendations for Other Services       Precautions / Restrictions Precautions Precautions: Fall Recall of Precautions/Restrictions: Intact Restrictions Weight Bearing Restrictions Per Provider Order: Yes RLE Weight Bearing Per Provider Order: Weight bearing as tolerated     Mobility  Bed Mobility Overal bed mobility: Needs Assistance Bed Mobility: Rolling, Sidelying to Sit Rolling: Contact guard assist Sidelying to sit: Mod assist, HOB elevated, Used rails       General bed  mobility comments: CGA to roll toward her L side and up to modA to fully raise trunk and scoot hips forward to foot flat. Fair balance once LLE supported by floor.    Transfers Overall transfer level: Needs assistance Equipment used: Rolling walker (2 wheels), Ambulation equipment used Transfers: Sit to/from Stand, Bed to chair/wheelchair/BSC Sit to Stand: Min assist, From elevated surface, Via lift equipment, Mod assist   Step pivot transfers: Mod assist       General transfer comment: EOB>recliner via Stedy, minA from elevated bed and Stedy flaps for STS. From recliner, modA for sit<>stand to RW due to guarding/pain and to stabilize upon standing. Forward/backward and lateral steps in front of chair ~30ft with RW but limited due to pain and pt having difficulty accepting weight through RLE so defer gait progression after that. Transfer via Lift Equipment: Stedy  Ambulation/Gait                   Stairs             Wheelchair Mobility     Tilt Bed    Modified Rankin (Stroke Patients Only)       Balance Overall balance assessment: Needs assistance Sitting-balance support: Feet supported, Bilateral upper extremity supported Sitting balance-Leahy Scale: Poor Sitting balance - Comments: minA initially but once scooted forward more and using BUE for support, CGA to Supervision at EOB.   Standing balance support: Bilateral upper extremity supported, During functional activity, Reliant on assistive device for balance Standing balance-Leahy Scale: Poor Standing balance comment: RW/Stedy and external support  Communication Communication Communication: No apparent difficulties  Cognition Arousal: Alert Behavior During Therapy: WFL for tasks assessed/performed   PT - Cognitive impairments: No apparent impairments                       PT - Cognition Comments: Pt pleasantly cooperative and following commands  well. Following commands: Intact      Cueing Cueing Techniques: Verbal cues  Exercises Other Exercises Other Exercises: supine RLE AROM: ankle pumps, quad sets (bil), heel slides, hip ab/adduction x5-10 reps ea Other Exercises: STS x 2 from chair for BLE strengthening    General Comments General comments (skin integrity, edema, etc.): SpO2 90% on RA at rest in chair (pt recently weaned then had 2L replaced, RN agreeable to PTA trying to wean), with pursed-lip breathing SpO2 improved to 95% but then drops when pt resting in chair when not cued for deep breaths. RN notified pt placed back on 2L/min [place] at end of session. Pt up in recliner with chair alarm on for safety, lunch and beverages set up for her for comfort, RN/NT aware pt wearing pull-up briefs due to hx urinary incontinence at baseline and purewick was from previous day so it was removed.      Pertinent Vitals/Pain Pain Assessment Pain Assessment: 0-10 Faces Pain Scale: Hurts even more Pain Location: R hip with weight bearing, decreased some at rest Pain Descriptors / Indicators: Discomfort, Grimacing, Sore, Operative site guarding Pain Intervention(s): Limited activity within patient's tolerance, Monitored during session, Premedicated before session, Repositioned, Ice applied    Home Living                          Prior Function            PT Goals (current goals can now be found in the care plan section) Acute Rehab PT Goals PT Goal Formulation: With patient Time For Goal Achievement: 03/06/23 Progress towards PT goals: Progressing toward goals    Frequency    Min 1X/week      PT Plan      Co-evaluation              AM-PAC PT "6 Clicks" Mobility   Outcome Measure  Help needed turning from your back to your side while in a flat bed without using bedrails?: A Little Help needed moving from lying on your back to sitting on the side of a flat bed without using bedrails?: A Lot Help needed  moving to and from a bed to a chair (including a wheelchair)?: A Lot Help needed standing up from a chair using your arms (e.g., wheelchair or bedside chair)?: A Lot Help needed to walk in hospital room?: Total (<60ft) Help needed climbing 3-5 steps with a railing? : Total 6 Click Score: 11    End of Session Equipment Utilized During Treatment: Gait belt;Oxygen (weaned to RA then 2L O2 [place] replaced at end of session due to SpO2 90% on RA) Activity Tolerance: Patient limited by pain;Patient tolerated treatment well Patient left: with call bell/phone within reach;in chair;with chair alarm set;Other (comment) (pt set up to eat lunch) Nurse Communication: Mobility status;Need for lift equipment;Other (comment);Precautions (purewick removed; pt needs new bed linens placed, SpO2 readings may need longer O2 extension cord) PT Visit Diagnosis: Unsteadiness on feet (R26.81);Other abnormalities of gait and mobility (R26.89);Repeated falls (R29.6);Muscle weakness (generalized) (M62.81);History of falling (Z91.81);Pain Pain - Right/Left: Right Pain - part of body: Hip  Time: 1256-1330 PT Time Calculation (min) (ACUTE ONLY): 34 min  Charges:    $Therapeutic Exercise: 8-22 mins $Therapeutic Activity: 8-22 mins PT General Charges $$ ACUTE PT VISIT: 1 Visit                     Advit Trethewey P., PTA Acute Rehabilitation Services Secure Chat Preferred 9a-5:30pm Office: [phone]    Dorathy Kinsman [facility] 02/21/2023, 2:18 PM

## 2023-02-22 DIAGNOSIS — S72001A Fracture of unspecified part of neck of right femur, initial encounter for closed fracture: Secondary | ICD-10-CM | POA: Diagnosis not present

## 2023-02-22 NOTE — Plan of Care (Signed)

## 2023-02-22 NOTE — Progress Notes (Signed)
 PROGRESS NOTE    Holly Jimenez  VWU:[id number] DOB: 1945/09/28 DOA: 02/19/2023 PCP: Irena Reichmann, DO    Brief Narrative:  78 year old with history of Raynaud's disease, CKD stage IIIb, GERD, osteoarthritis, hypertension, left total hip 12/2022 presented with mechanical fall and right hip pain.  She was found to have acute right proximal femoral fracture.  Underwent ORIF 2/13.  Subjective:  No new events.  Pain is controlled.   Assessment & Plan:   Closed traumatic right hip fracture: Status post ORIF Dr. Magnus Ivan 2/13-anterior approach total hip arthroplasty. Weightbearing as tolerated Pain management, oral opiates and Tylenol. DVT prophylaxis, aspirin 81 mg twice daily. Work with PT OT today.  Refer to SNF for rehab.  Anemia of acute blood loss: chronic underlying anemia.  Baseline hemoglobin 10-11. Hemoglobin 9.4.  Presented with hemoglobin 9.5.  Expected blood loss.  Also with chronic anemia.  Currently no evidence of active bleeding.  Monitor.  Will prescribe iron on discharge.  Chronic medical history including CKD stage IIIb: At about baseline.  Creatinine improved to 1.2. Raynaud's disease: On Plaquenil.  Maintenance prednisone 5 mg daily.  Continue.  Continued.  Patient also on chronic pain management with Lyrica.  This is continued. GERD: On PPI. Hypertension: On losartan at home.  Resume.  Is stable.    DVT prophylaxis: SCDs Start: 02/19/23 1439 SCDs Start: 02/19/23 0315   Code Status: Full code Family Communication: None at the bedside Disposition Plan: Status is: Inpatient Remains inpatient appropriate because: Postop.  Stable for SNF placement.     Consultants:  Orthopedics  Procedures:  ORIF right hip  Antimicrobials:  None     Objective: Vitals:   02/21/23 1200 02/21/23 1928 02/22/23 0417 02/22/23 0758  BP:  (!) 140/61 131/60 (!) 151/46  Pulse:  77 65 72  Resp:  18 16 16   Temp:  98 F (36.7 C) 97.8 F (36.6 C) 98.7 F (37.1  C)  TempSrc:  Oral Oral Oral  SpO2: 95% 93% 93% 99%  Weight:      Height:        Intake/Output Summary (Last 24 hours) at 02/22/2023 1259 Last data filed at 02/22/2023 0300 Gross per 24 hour  Intake 240 ml  Output 700 ml  Net -460 ml   Filed Weights   02/19/23 0114 02/19/23 0900  Weight: 40.8 kg 40.8 kg    Examination:  General exam: Appears calm and comfortable.  On room air. Respiratory system: Clear to auscultation. Respiratory effort normal. Cardiovascular system: S1 & S2 heard, RRR.  Gastrointestinal system: soft , nontender. Central nervous system: Alert and oriented. No focal neurological deficits. Extremities: Symmetric 5 x 5 power. Skin: Right lateral thigh incision clean and dry.  Incisions intact.   Data Reviewed: I have personally reviewed following labs and imaging studies  CBC: Recent Labs  Lab 02/19/23 0130 02/19/23 0342 02/20/23 0558 02/21/23 0550  WBC 10.6* 9.8 11.2* 10.7*  NEUTROABS 7.9*  --   --   --   HGB 9.5* 9.6* 8.1* 9.4*  HCT 31.8* 32.2* 26.2* 31.2*  MCV 89.8 90.2 88.5 88.1  PLT 285 277 223 225   Basic Metabolic Panel: Recent Labs  Lab 02/19/23 0130 02/19/23 0342 02/21/23 0550  NA 138 138 137  K 3.8 3.8 5.0  CL 107 106 107  CO2 20* 20* 21*  GLUCOSE 93 89 92  BUN 29* 27* 20  CREATININE 1.78* 1.57* 1.21*  CALCIUM 9.2 9.2 9.0   GFR: Estimated Creatinine Clearance: 24.3 mL/min (A) (  by C-G formula based on SCr of 1.21 mg/dL (H)). Liver Function Tests: No results for input(s): "AST", "ALT", "ALKPHOS", "BILITOT", "PROT", "ALBUMIN" in the last 168 hours. No results for input(s): "LIPASE", "AMYLASE" in the last 168 hours. No results for input(s): "AMMONIA" in the last 168 hours. Coagulation Profile: No results for input(s): "INR", "PROTIME" in the last 168 hours. Cardiac Enzymes: No results for input(s): "CKTOTAL", "CKMB", "CKMBINDEX", "TROPONINI" in the last 168 hours. BNP (last 3 results) No results for input(s): "PROBNP" in the  last 8760 hours. HbA1C: No results for input(s): "HGBA1C" in the last 72 hours. CBG: No results for input(s): "GLUCAP" in the last 168 hours. Lipid Profile: No results for input(s): "CHOL", "HDL", "LDLCALC", "TRIG", "CHOLHDL", "LDLDIRECT" in the last 72 hours. Thyroid Function Tests: No results for input(s): "TSH", "T4TOTAL", "FREET4", "T3FREE", "THYROIDAB" in the last 72 hours. Anemia Panel: No results for input(s): "VITAMINB12", "FOLATE", "FERRITIN", "TIBC", "IRON", "RETICCTPCT" in the last 72 hours. Sepsis Labs: No results for input(s): "PROCALCITON", "LATICACIDVEN" in the last 168 hours.  Recent Results (from the past 240 hours)  Surgical pcr screen     Status: None   Collection Time: 02/19/23  8:41 AM   Specimen: Nasal Mucosa; Nasal Swab  Result Value Ref Range Status   MRSA, PCR NEGATIVE NEGATIVE Final   Staphylococcus aureus NEGATIVE NEGATIVE Final    Comment: (NOTE) The Xpert SA Assay (FDA approved for NASAL specimens in patients 69 years of age and older), is one component of a comprehensive surveillance program. It is not intended to diagnose infection nor to guide or monitor treatment. Performed at [facility], [address] [place], [place] [address]          Radiology Studies: No results found.       Scheduled Meds:  acidophilus  1 capsule Oral Daily   aspirin  81 mg Oral BID   cholecalciferol  1,000 Units Oral Daily   cyanocobalamin  1,000 mcg Oral Daily   docusate sodium  100 mg Oral BID   feeding supplement  237 mL Oral TID BM   ferrous gluconate  324 mg Oral BID   hydroxychloroquine  200 mg Oral BID   losartan  50 mg Oral Daily   mirtazapine  15 mg Oral QHS   multivitamin with minerals  1 tablet Oral Daily   oxybutynin  5 mg Oral BID   pantoprazole  40 mg Oral Daily   predniSONE  5 mg Oral Daily   pregabalin  75 mg Oral Daily   pyridOXINE  100 mg Oral Daily   Continuous Infusions:     LOS: 3 days    Time spent: 35  minutes    Dorcas Carrow, MD [facility]

## 2023-02-23 DIAGNOSIS — S72001A Fracture of unspecified part of neck of right femur, initial encounter for closed fracture: Secondary | ICD-10-CM | POA: Diagnosis not present

## 2023-02-23 NOTE — Progress Notes (Signed)
 PROGRESS NOTE    Holly Jimenez  YNW:[id number] DOB: [birth year] DOA: 02/19/2023 PCP: Irena Reichmann, DO    Brief Narrative:  78 year old with history of Raynaud's disease, CKD stage IIIb, GERD, osteoarthritis, hypertension, left total hip 12/2022 presented with mechanical fall and right hip pain.  She was found to have acute right proximal femoral fracture.  Underwent ORIF 2/13.  Subjective:  Patient seen and examined.  No overnight events.  Pain is controlled.  On room air now.   Assessment & Plan:   Closed traumatic right hip fracture: Status post ORIF Dr. Magnus Ivan 2/13-anterior approach total hip arthroplasty. Weightbearing as tolerated Pain management, oral opiates and Tylenol. DVT prophylaxis, aspirin 81 mg twice daily. Work with PT OT.  Refer to SNF for rehab.  Anemia of acute blood loss: chronic underlying anemia.  Baseline hemoglobin 10-11. Hemoglobin 9.4.  Presented with hemoglobin 9.5.  Expected blood loss.  Also with chronic anemia.  Currently no evidence of active bleeding.  Monitor.  Will prescribe iron on discharge.  Chronic medical history including CKD stage IIIb: At about baseline.  Creatinine improved to 1.2. Raynaud's disease: On Plaquenil.  Maintenance prednisone 5 mg daily.  Continue.  Continued.  Patient also on chronic pain management with Lyrica.  This is continued. GERD: On PPI. Hypertension: On losartan at home.  Stable.    DVT prophylaxis: SCDs Start: 02/19/23 1439 SCDs Start: 02/19/23 0315   Code Status: Full code Family Communication: None at the bedside Disposition Plan: Status is: Inpatient Remains inpatient appropriate because: Postop.  Stable for SNF placement.     Consultants:  Orthopedics  Procedures:  ORIF right hip  Antimicrobials:  None     Objective: Vitals:   02/22/23 1358 02/22/23 1953 02/23/23 0419 02/23/23 0754  BP: 123/60 (!) 133/50 (!) 141/58 (!) 128/52  Pulse: 66 70 71 70  Resp: 18 16 16 16   Temp:  98.4 F (36.9 C) 98.9 F (37.2 C)  98.6 F (37 C)  TempSrc: Oral Oral  Oral  SpO2: 96% 93% 92% 96%  Weight:      Height:        Intake/Output Summary (Last 24 hours) at 02/23/2023 1128 Last data filed at 02/23/2023 0943 Gross per 24 hour  Intake 360 ml  Output 1600 ml  Net -1240 ml   Filed Weights   02/19/23 0114 02/19/23 0900  Weight: 40.8 kg 40.8 kg    Examination:  General exam: Looks fairly comfortable. Respiratory system: Clear to auscultation. Respiratory effort normal. Cardiovascular system: S1 & S2 heard, RRR.  Gastrointestinal system: soft , nontender. Central nervous system: Alert and oriented. No focal neurological deficits. Extremities: Symmetric 5 x 5 power. Skin: Right lateral thigh incision clean and dry.  Incisions intact.   Data Reviewed: I have personally reviewed following labs and imaging studies  CBC: Recent Labs  Lab 02/19/23 0130 02/19/23 0342 02/20/23 0558 02/21/23 0550  WBC 10.6* 9.8 11.2* 10.7*  NEUTROABS 7.9*  --   --   --   HGB 9.5* 9.6* 8.1* 9.4*  HCT 31.8* 32.2* 26.2* 31.2*  MCV 89.8 90.2 88.5 88.1  PLT 285 277 223 225   Basic Metabolic Panel: Recent Labs  Lab 02/19/23 0130 02/19/23 0342 02/21/23 0550  NA 138 138 137  K 3.8 3.8 5.0  CL 107 106 107  CO2 20* 20* 21*  GLUCOSE 93 89 92  BUN 29* 27* 20  CREATININE 1.78* 1.57* 1.21*  CALCIUM 9.2 9.2 9.0   GFR: Estimated Creatinine Clearance: 24.3  mL/min (A) (by C-G formula based on SCr of 1.21 mg/dL (H)). Liver Function Tests: No results for input(s): "AST", "ALT", "ALKPHOS", "BILITOT", "PROT", "ALBUMIN" in the last 168 hours. No results for input(s): "LIPASE", "AMYLASE" in the last 168 hours. No results for input(s): "AMMONIA" in the last 168 hours. Coagulation Profile: No results for input(s): "INR", "PROTIME" in the last 168 hours. Cardiac Enzymes: No results for input(s): "CKTOTAL", "CKMB", "CKMBINDEX", "TROPONINI" in the last 168 hours. BNP (last 3 results) No  results for input(s): "PROBNP" in the last 8760 hours. HbA1C: No results for input(s): "HGBA1C" in the last 72 hours. CBG: No results for input(s): "GLUCAP" in the last 168 hours. Lipid Profile: No results for input(s): "CHOL", "HDL", "LDLCALC", "TRIG", "CHOLHDL", "LDLDIRECT" in the last 72 hours. Thyroid Function Tests: No results for input(s): "TSH", "T4TOTAL", "FREET4", "T3FREE", "THYROIDAB" in the last 72 hours. Anemia Panel: No results for input(s): "VITAMINB12", "FOLATE", "FERRITIN", "TIBC", "IRON", "RETICCTPCT" in the last 72 hours. Sepsis Labs: No results for input(s): "PROCALCITON", "LATICACIDVEN" in the last 168 hours.  Recent Results (from the past 240 hours)  Surgical pcr screen     Status: None   Collection Time: 02/19/23  8:41 AM   Specimen: Nasal Mucosa; Nasal Swab  Result Value Ref Range Status   MRSA, PCR NEGATIVE NEGATIVE Final   Staphylococcus aureus NEGATIVE NEGATIVE Final    Comment: (NOTE) The Xpert SA Assay (FDA approved for NASAL specimens in patients 37 years of age and older), is one component of a comprehensive surveillance program. It is not intended to diagnose infection nor to guide or monitor treatment. Performed at [facility], [address] [place], [place] [address]          Radiology Studies: No results found.       Scheduled Meds:  acidophilus  1 capsule Oral Daily   aspirin  81 mg Oral BID   cholecalciferol  1,000 Units Oral Daily   cyanocobalamin  1,000 mcg Oral Daily   docusate sodium  100 mg Oral BID   feeding supplement  237 mL Oral TID BM   ferrous gluconate  324 mg Oral BID   hydroxychloroquine  200 mg Oral BID   losartan  50 mg Oral Daily   mirtazapine  15 mg Oral QHS   multivitamin with minerals  1 tablet Oral Daily   oxybutynin  5 mg Oral BID   pantoprazole  40 mg Oral Daily   predniSONE  5 mg Oral Daily   pregabalin  75 mg Oral Daily   pyridOXINE  100 mg Oral Daily   Continuous Infusions:     LOS:  4 days    Time spent: 35 minutes    Dorcas Carrow, MD [facility]

## 2023-02-23 NOTE — TOC Progression Note (Addendum)
 Transition of Care [facility]) - Progression Note    Patient Details  Name: Holly Jimenez MRN: [id number] Date of Birth: 1945-05-25  Transition of Care [facility]) CM/SW Contact  Lorri Frederick, LCSW Phone Number: 02/23/2023, 11:08 AM  Clinical Narrative:   CSW discussed bed offers with pt.  She is aware she will be in copay days and not sure how she will afford this.  She reports she lives alone and does not have extra support at home.  She is also asking for a response from [facility].  CSW reached out to [facility] to review.   1410: CSW has not gotten response from [facility].  CSW spoke with pt again, she does want to move forward with SNF and she does have the funds to pay copays in advance.  Her first choice would be [facility].    1510: [facility] can offer bed, will determine how much copay is owed.  Pt is agreeable to this.   SNF auth request--message left with [facility].     Expected Discharge Plan: Skilled Nursing Facility Barriers to Discharge: Continued Medical Work up, SNF Pending bed offer, English as a second language teacher  Expected Discharge Plan and Services In-house Referral: Clinical Social Work     Living arrangements for the past 2 months: Single Family Home                                       Social Determinants of Health (SDOH) Interventions SDOH Screenings   Food Insecurity: No Food Insecurity (02/19/2023)  Housing: Low Risk  (02/19/2023)  Transportation Needs: No Transportation Needs (02/19/2023)  Utilities: Not At Risk (02/19/2023)  Depression (PHQ2-9): Low Risk  (10/23/2022)  Social Connections: Moderately Isolated (02/19/2023)  Tobacco Use: Medium Risk (02/19/2023)    Readmission Risk Interventions     No data to display

## 2023-02-23 NOTE — Progress Notes (Signed)
 Mobility Specialist Progress Note:    02/23/23 1000  Mobility  Activity Transferred from bed to chair;Ambulated with assistance in room  Level of Assistance Minimal assist, patient does 75% or more  Assistive Device Front wheel walker  Distance Ambulated (ft) 6 ft  RLE Weight Bearing Per Provider Order WBAT  Activity Response Tolerated well  Mobility visit 1 Mobility  Mobility Specialist Start Time (ACUTE ONLY) [id number]  Mobility Specialist Stop Time (ACUTE ONLY) 0919  Mobility Specialist Time Calculation (min) (ACUTE ONLY) 14 min   Pt received in bed and agreeable. Able to come EOB w/ MinG and stand w/ minA. No complaints throughout. Pt left in chair with call bell and chair alarm on.  [place] Earlene Plater Mobility Specialist Please contact via Special educational needs teacher or Rehab office at [phone]

## 2023-02-23 NOTE — Plan of Care (Signed)

## 2023-02-23 NOTE — Progress Notes (Signed)
 Physical Therapy Treatment Patient Details Name: Holly Jimenez MRN: [id number] DOB: [birth year] Today's Date: 02/23/2023   History of Present Illness Pt is a 78 y/o F s/p R direct anterior total hip arthroplasty s/p fall. PMH includes Raynaud's disease, CKD stage IIIb, DDD, GERD, osteoarthritis, hypertension, Lupus, peptic ulcer disease, status post left total hip arthroplasty 12/26/2022.    PT Comments  Pt endorses feeling better today, reports moderate R hip pain but improving. Pt ambulatory for 40 ft with use of RW and light steadying assist, accepts good weight on RLE during stance phase of gait. Pt tolerated RTHA exercsies well. Pt progressing well, plan remains appropriate.      If plan is discharge home, recommend the following: Assistance with cooking/housework;Assist for transportation;Help with stairs or ramp for entrance;A little help with walking and/or transfers;A little help with bathing/dressing/bathroom   Can travel by private vehicle        Equipment Recommendations  None recommended by PT    Recommendations for Other Services       Precautions / Restrictions Precautions Precautions: Fall Restrictions Weight Bearing Restrictions Per Provider Order: No RLE Weight Bearing Per Provider Order: Weight bearing as tolerated     Mobility  Bed Mobility Overal bed mobility: Needs Assistance Bed Mobility: Supine to Sit, Sit to Supine     Supine to sit: Min assist Sit to supine: Min assist   General bed mobility comments: assist for RLE progression, trunk lowering, and repositioning in bed    Transfers Overall transfer level: Needs assistance Equipment used: Rolling walker (2 wheels) Transfers: Sit to/from Stand Sit to Stand: Min assist           General transfer comment: assist for rise and steady, cues for hand placement when rising and sitting.    Ambulation/Gait Ambulation/Gait assistance: Min assist Gait Distance (Feet): 40 Feet Assistive  device: Rolling walker (2 wheels) Gait Pattern/deviations: Step-through pattern, Decreased stride length, Trunk flexed Gait velocity: decr     General Gait Details: cues for upright posture, min steadying assist especially during turns   Stairs             Wheelchair Mobility     Tilt Bed    Modified Rankin (Stroke Patients Only)       Balance Overall balance assessment: Needs assistance Sitting-balance support: Feet supported, Bilateral upper extremity supported Sitting balance-Leahy Scale: Fair     Standing balance support: Bilateral upper extremity supported, During functional activity, Reliant on assistive device for balance Standing balance-Leahy Scale: Poor Standing balance comment: reliant on UE support                            Communication Communication Communication: No apparent difficulties  Cognition Arousal: Alert Behavior During Therapy: WFL for tasks assessed/performed   PT - Cognitive impairments: No apparent impairments                         Following commands: Intact      Cueing Cueing Techniques: Verbal cues  Exercises Total Joint Exercises Heel Slides: AAROM, Right, 10 reps, Supine Hip ABduction/ADduction: AAROM, Right, 10 reps, Supine Long Arc Quad: AROM, Right, 10 reps, Seated    General Comments General comments (skin integrity, edema, etc.): VSS on RA      Pertinent Vitals/Pain Pain Assessment Pain Assessment: 0-10 Pain Score: 6  Pain Location: R hip Pain Descriptors / Indicators: Discomfort, Sore, Operative site guarding Pain  Intervention(s): Limited activity within patient's tolerance, Monitored during session, Repositioned    Home Living                          Prior Function            PT Goals (current goals can now be found in the care plan section) Acute Rehab PT Goals PT Goal Formulation: With patient Time For Goal Achievement: 03/06/23 Potential to Achieve Goals:  Good Progress towards PT goals: Progressing toward goals    Frequency    Min 1X/week      PT Plan      Co-evaluation              AM-PAC PT "6 Clicks" Mobility   Outcome Measure  Help needed turning from your back to your side while in a flat bed without using bedrails?: A Little Help needed moving from lying on your back to sitting on the side of a flat bed without using bedrails?: A Little Help needed moving to and from a bed to a chair (including a wheelchair)?: A Little Help needed standing up from a chair using your arms (e.g., wheelchair or bedside chair)?: A Little Help needed to walk in hospital room?: A Little Help needed climbing 3-5 steps with a railing? : A Lot 6 Click Score: 17    End of Session Equipment Utilized During Treatment: Gait belt Activity Tolerance: Patient limited by pain;Patient tolerated treatment well Patient left: with call bell/phone within reach;in bed;with bed alarm set;with SCD's reapplied Nurse Communication: Mobility status PT Visit Diagnosis: Unsteadiness on feet (R26.81);Other abnormalities of gait and mobility (R26.89);Repeated falls (R29.6);Muscle weakness (generalized) (M62.81);History of falling (Z91.81);Pain Pain - Right/Left: Right Pain - part of body: Hip     Time: 0454-0981 PT Time Calculation (min) (ACUTE ONLY): 21 min  Charges:    $Gait Training: 8-22 mins PT General Charges $$ ACUTE PT VISIT: 1 Visit                     [name], PT DPT Acute Rehabilitation Services Secure Chat Preferred  Office [phone]    Brad Mcgaughy E Christain [place] 02/23/2023, 4:40 PM

## 2023-02-23 NOTE — Progress Notes (Signed)
 Patient ID: Holly Jimenez, female   DOB: January 15, 1945, 78 y.o.   MRN: [id number] The patient is awake and alert this morning.  There has been no acute change in her medical status.  Her vital signs are stable.  I did change her right hip dressing and her incision is clean and dry.  From orthopedic standpoint, she can be discharged to skilled nursing when a bed is available.  Attempts to mobilize her can still be made with PT/OT.

## 2023-02-24 DIAGNOSIS — S72001A Fracture of unspecified part of neck of right femur, initial encounter for closed fracture: Secondary | ICD-10-CM | POA: Diagnosis not present

## 2023-02-24 MED ORDER — PREGABALIN 75 MG PO CAPS: 75.0000 mg | ORAL_CAPSULE | Freq: Every day | ORAL | 0 refills | Status: AC

## 2023-02-24 MED ORDER — DOCUSATE SODIUM 100 MG PO CAPS: 100.0000 mg | ORAL_CAPSULE | Freq: Two times a day (BID) | ORAL | Status: AC

## 2023-02-24 NOTE — TOC Progression Note (Addendum)
 Transition of Care [facility]) - Progression Note    Patient Details  Name: Holly Jimenez MRN: [id number] Date of Birth: Nov 23, 1945  Transition of Care [facility]) CM/SW Contact  Lorri Frederick, LCSW Phone Number: 02/24/2023, 2:33 PM  Clinical Narrative:   SNF auth approved, 7 days: [id number], PTAR approved: [id number].  CSW confirmed with [facility]--they can receive pt tomorrow.  They do still need the copays in advance $2996.  Pt informed and will call and pay the copays.     Expected Discharge Plan: Skilled Nursing Facility Barriers to Discharge: Continued Medical Work up, SNF Pending bed offer, Insurance Authorization  Expected Discharge Plan and Services In-house Referral: Clinical Social Work     Living arrangements for the past 2 months: Single Family Home Expected Discharge Date: 02/24/23                                     Social Determinants of Health (SDOH) Interventions SDOH Screenings   Food Insecurity: No Food Insecurity (02/19/2023)  Housing: Low Risk  (02/19/2023)  Transportation Needs: No Transportation Needs (02/19/2023)  Utilities: Not At Risk (02/19/2023)  Depression (PHQ2-9): Low Risk  (10/23/2022)  Social Connections: Moderately Isolated (02/19/2023)  Tobacco Use: Medium Risk (02/19/2023)    Readmission Risk Interventions     No data to display

## 2023-02-24 NOTE — Progress Notes (Signed)
 PROGRESS NOTE    Holly Jimenez  ZOX:[id number] DOB: April 08, 1945 DOA: 02/19/2023 PCP: Irena Reichmann, DO    Brief Narrative:  78 year old with history of Raynaud's disease, CKD stage IIIb, GERD, osteoarthritis, hypertension, left total hip 12/2022 presented with mechanical fall and right hip pain.  She was found to have acute right proximal femoral fracture.  Underwent ORIF 2/13.  Subjective:  Patient seen and examined.  No overnight events.  Pain is controlled.  On room air.  Afebrile.   Assessment & Plan:   Closed traumatic right hip fracture: Status post ORIF Dr. Magnus Ivan 2/13-anterior approach total hip arthroplasty. Weightbearing as tolerated Pain management, oral opiates and Tylenol. DVT prophylaxis, aspirin 81 mg twice daily. Work with PT OT.  Refer to SNF for rehab.  Anemia of acute blood loss: chronic underlying anemia.  Baseline hemoglobin 10-11. Hemoglobin 9.4.  Presented with hemoglobin 9.5.  Expected blood loss.  Also with chronic anemia.  Currently no evidence of active bleeding.  Monitor.  Will prescribe iron on discharge.  Chronic medical history including CKD stage IIIb: At about baseline.  Creatinine improved to 1.2. Raynaud's disease: On Plaquenil.  Maintenance prednisone 5 mg daily.  Continue.  Continued.  Patient also on chronic pain management with Lyrica.  This is continued. GERD: On PPI. Hypertension: On losartan at home.  Stable.    DVT prophylaxis: SCDs Start: 02/19/23 1439 SCDs Start: 02/19/23 0315   Code Status: Full code Family Communication: None at the bedside Disposition Plan: Status is: Inpatient Remains inpatient appropriate because: Postop.  Stable for SNF placement.     Consultants:  Orthopedics  Procedures:  ORIF right hip  Antimicrobials:  None     Objective: Vitals:   02/23/23 1946 02/23/23 1946 02/24/23 0451 02/24/23 0756  BP:  (!) 139/59 (!) 114/51 (!) 108/49  Pulse:  70 66 65  Resp:  16 15 16   Temp:  98.3 F (36.8 C) 98.7 F (37.1 C) 98.3 F (36.8 C) 98.7 F (37.1 C)  TempSrc: Oral Oral Oral Oral  SpO2:  95% 93% 96%  Weight:      Height:        Intake/Output Summary (Last 24 hours) at 02/24/2023 1314 Last data filed at 02/23/2023 1947 Gross per 24 hour  Intake --  Output 450 ml  Net -450 ml   Filed Weights   02/19/23 0114 02/19/23 0900  Weight: 40.8 kg 40.8 kg    Examination:  General exam: Looks comfortable.  On room air. Respiratory system: Clear to auscultation. Respiratory effort normal. Cardiovascular system: S1 & S2 heard, RRR.  Gastrointestinal system: soft , nontender. Central nervous system: Alert and oriented. No focal neurological deficits. Extremities: Symmetric 5 x 5 power. Skin: Right lateral thigh incision clean and dry.  Incisions intact.  Distal neurovascular status intact.   Data Reviewed: I have personally reviewed following labs and imaging studies  CBC: Recent Labs  Lab 02/19/23 0130 02/19/23 0342 02/20/23 0558 02/21/23 0550  WBC 10.6* 9.8 11.2* 10.7*  NEUTROABS 7.9*  --   --   --   HGB 9.5* 9.6* 8.1* 9.4*  HCT 31.8* 32.2* 26.2* 31.2*  MCV 89.8 90.2 88.5 88.1  PLT 285 277 223 225   Basic Metabolic Panel: Recent Labs  Lab 02/19/23 0130 02/19/23 0342 02/21/23 0550  NA 138 138 137  K 3.8 3.8 5.0  CL 107 106 107  CO2 20* 20* 21*  GLUCOSE 93 89 92  BUN 29* 27* 20  CREATININE 1.78* 1.57* 1.21*  CALCIUM 9.2 9.2 9.0   GFR: Estimated Creatinine Clearance: 24.3 mL/min (A) (by C-G formula based on SCr of 1.21 mg/dL (H)). Liver Function Tests: No results for input(s): "AST", "ALT", "ALKPHOS", "BILITOT", "PROT", "ALBUMIN" in the last 168 hours. No results for input(s): "LIPASE", "AMYLASE" in the last 168 hours. No results for input(s): "AMMONIA" in the last 168 hours. Coagulation Profile: No results for input(s): "INR", "PROTIME" in the last 168 hours. Cardiac Enzymes: No results for input(s): "CKTOTAL", "CKMB", "CKMBINDEX",  "TROPONINI" in the last 168 hours. BNP (last 3 results) No results for input(s): "PROBNP" in the last 8760 hours. HbA1C: No results for input(s): "HGBA1C" in the last 72 hours. CBG: No results for input(s): "GLUCAP" in the last 168 hours. Lipid Profile: No results for input(s): "CHOL", "HDL", "LDLCALC", "TRIG", "CHOLHDL", "LDLDIRECT" in the last 72 hours. Thyroid Function Tests: No results for input(s): "TSH", "T4TOTAL", "FREET4", "T3FREE", "THYROIDAB" in the last 72 hours. Anemia Panel: No results for input(s): "VITAMINB12", "FOLATE", "FERRITIN", "TIBC", "IRON", "RETICCTPCT" in the last 72 hours. Sepsis Labs: No results for input(s): "PROCALCITON", "LATICACIDVEN" in the last 168 hours.  Recent Results (from the past 240 hours)  Surgical pcr screen     Status: None   Collection Time: 02/19/23  8:41 AM   Specimen: Nasal Mucosa; Nasal Swab  Result Value Ref Range Status   MRSA, PCR NEGATIVE NEGATIVE Final   Staphylococcus aureus NEGATIVE NEGATIVE Final    Comment: (NOTE) The Xpert SA Assay (FDA approved for NASAL specimens in patients 75 years of age and older), is one component of a comprehensive surveillance program. It is not intended to diagnose infection nor to guide or monitor treatment. Performed at [facility], [address] [place], [place] [address]          Radiology Studies: No results found.       Scheduled Meds:  acidophilus  1 capsule Oral Daily   aspirin  81 mg Oral BID   cholecalciferol  1,000 Units Oral Daily   cyanocobalamin  1,000 mcg Oral Daily   docusate sodium  100 mg Oral BID   feeding supplement  237 mL Oral TID BM   ferrous gluconate  324 mg Oral BID   hydroxychloroquine  200 mg Oral BID   losartan  50 mg Oral Daily   mirtazapine  15 mg Oral QHS   multivitamin with minerals  1 tablet Oral Daily   oxybutynin  5 mg Oral BID   pantoprazole  40 mg Oral Daily   predniSONE  5 mg Oral Daily   pregabalin  75 mg Oral Daily    pyridOXINE  100 mg Oral Daily   Continuous Infusions:     LOS: 5 days    Time spent: 35 minutes    Dorcas Carrow, MD [facility]

## 2023-02-24 NOTE — Progress Notes (Signed)
 Physical Therapy Treatment Patient Details Name: Holly Jimenez MRN: [id number] DOB: [birth year] Today's Date: 02/24/2023   History of Present Illness Pt is a 78 y/o F s/p R direct anterior total hip arthroplasty s/p fall. PMH includes Raynaud's disease, CKD stage IIIb, DDD, GERD, osteoarthritis, hypertension, Lupus, peptic ulcer disease, status post left total hip arthroplasty 12/26/2022.    PT Comments  Pt progressing steadily towards her physical therapy goals. Premedicated prior to session. Pt requiring minimal assist for transfers and ambulating limited hallway distances with a RW and chair follow. Demonstrates RLE weakness, impaired standing balance, decreased activity tolerance, gait abnormalities. Presents as a high fall risk based on decreased gait speed and history of falls. Patient will benefit from continued inpatient follow up therapy, <3 hours/day.   If plan is discharge home, recommend the following: Assistance with cooking/housework;Assist for transportation;Help with stairs or ramp for entrance;A little help with walking and/or transfers;A little help with bathing/dressing/bathroom   Can travel by private vehicle     Yes  Equipment Recommendations  None recommended by PT    Recommendations for Other Services       Precautions / Restrictions Precautions Precautions: Fall Recall of Precautions/Restrictions: Intact Restrictions Weight Bearing Restrictions Per Provider Order: Yes RLE Weight Bearing Per Provider Order: Weight bearing as tolerated     Mobility  Bed Mobility               General bed mobility comments: OOB in chair    Transfers Overall transfer level: Needs assistance Equipment used: Rolling walker (2 wheels) Transfers: Sit to/from Stand Sit to Stand: Min assist           General transfer comment: min assist to power up and steady, cueing for hand placement and safety    Ambulation/Gait Ambulation/Gait assistance: Contact  guard assist Gait Distance (Feet): 120 Feet (80", 40") Assistive device: Rolling walker (2 wheels) Gait Pattern/deviations: Step-through pattern, Decreased stride length, Trunk flexed, Narrow base of support Gait velocity: decreased Gait velocity interpretation: <1.31 ft/sec, indicative of household ambulator   General Gait Details: Verbal cues for wider BOS and increased foot clearance, one seated rest break in between bouts. CGA for safety and stability   Stairs             Wheelchair Mobility     Tilt Bed    Modified Rankin (Stroke Patients Only)       Balance Overall balance assessment: Needs assistance Sitting-balance support: Feet supported, Bilateral upper extremity supported Sitting balance-Leahy Scale: Fair     Standing balance support: Bilateral upper extremity supported, During functional activity Standing balance-Leahy Scale: Poor Standing balance comment: reliant on UE support                            Communication Communication Communication: No apparent difficulties  Cognition Arousal: Alert Behavior During Therapy: WFL for tasks assessed/performed   PT - Cognitive impairments: No apparent impairments                         Following commands: Intact      Cueing Cueing Techniques: Verbal cues  Exercises      General Comments General comments (skin integrity, edema, etc.): VSS on RA      Pertinent Vitals/Pain Pain Assessment Pain Assessment: Faces Faces Pain Scale: Hurts little more Pain Location: R hip Pain Descriptors / Indicators: Discomfort, Sore, Operative site guarding Pain Intervention(s): Limited activity within  patient's tolerance, Monitored during session, Premedicated before session, Ice applied    Home Living                          Prior Function            PT Goals (current goals can now be found in the care plan section) Acute Rehab PT Goals Potential to Achieve Goals:  Good Progress towards PT goals: Progressing toward goals    Frequency    Min 1X/week      PT Plan      Co-evaluation              AM-PAC PT "6 Clicks" Mobility   Outcome Measure  Help needed turning from your back to your side while in a flat bed without using bedrails?: None Help needed moving from lying on your back to sitting on the side of a flat bed without using bedrails?: A Little Help needed moving to and from a bed to a chair (including a wheelchair)?: A Little Help needed standing up from a chair using your arms (e.g., wheelchair or bedside chair)?: A Little Help needed to walk in hospital room?: A Little Help needed climbing 3-5 steps with a railing? : A Lot 6 Click Score: 18    End of Session Equipment Utilized During Treatment: Gait belt Activity Tolerance: Patient tolerated treatment well Patient left: in chair;with call bell/phone within reach;with chair alarm set Nurse Communication: Mobility status PT Visit Diagnosis: Unsteadiness on feet (R26.81);Other abnormalities of gait and mobility (R26.89);Repeated falls (R29.6);Muscle weakness (generalized) (M62.81);History of falling (Z91.81);Pain Pain - Right/Left: Right Pain - part of body: Hip     Time: 1002-1033 PT Time Calculation (min) (ACUTE ONLY): 31 min  Charges:    $Gait Training: 8-22 mins $Therapeutic Activity: 8-22 mins PT General Charges $$ ACUTE PT VISIT: 1 Visit                     Lillia Pauls, PT, DPT Acute Rehabilitation Services Office [phone]    Norval Morton 02/24/2023, 11:32 AM

## 2023-02-24 NOTE — Progress Notes (Signed)
 Occupational Therapy Treatment Patient Details Name: Holly Jimenez MRN: [id number] DOB: 1945-05-08 Today's Date: 02/24/2023   History of present illness Pt is a 78 y/o F s/p R direct anterior total hip arthroplasty s/p fall. PMH includes Raynaud's disease, CKD stage IIIb, DDD, GERD, osteoarthritis, hypertension, Lupus, peptic ulcer disease, status post left total hip arthroplasty 12/26/2022.   OT comments  Pt supine in bed and agreeable to OT.  Bed mobility with supervision given increased time, min assist for transfers and functional mobility using RW to/from bathroom.  Min assist for toileting and setup for grooming.  Requires mod assist for LB dressing.  Progressing well and eager for rehab.  Continue to recommend <3hrs/day inpatient setting at dc. Will follow.       If plan is discharge home, recommend the following:  A little help with walking and/or transfers;A lot of help with bathing/dressing/bathroom;Assistance with cooking/housework;Assist for transportation;Help with stairs or ramp for entrance   Equipment Recommendations  Other (comment)    Recommendations for Other Services      Precautions / Restrictions Precautions Precautions: Fall Recall of Precautions/Restrictions: Intact Restrictions Weight Bearing Restrictions Per Provider Order: Yes RLE Weight Bearing Per Provider Order: Weight bearing as tolerated       Mobility Bed Mobility Overal bed mobility: Needs Assistance Bed Mobility: Supine to Sit     Supine to sit: Supervision     General bed mobility comments: HOB elevated but no physical assist required    Transfers Overall transfer level: Needs assistance Equipment used: Rolling walker (2 wheels) Transfers: Sit to/from Stand Sit to Stand: Min assist           General transfer comment: min assist to power up and steady, cueing for hand placement and safety     Balance Overall balance assessment: Needs assistance Sitting-balance  support: Feet supported, Bilateral upper extremity supported Sitting balance-Leahy Scale: Fair     Standing balance support: Bilateral upper extremity supported, During functional activity Standing balance-Leahy Scale: Poor Standing balance comment: reliant on UE support                           ADL either performed or assessed with clinical judgement   ADL Overall ADL's : Needs assistance/impaired     Grooming: Set up;Sitting           Upper Body Dressing : Supervision/safety;Set up;Sitting   Lower Body Dressing: Moderate assistance;Sit to/from stand   Toilet Transfer: Minimal assistance;Ambulation;Rolling walker (2 wheels);BSC/3in1   Toileting- Clothing Manipulation and Hygiene: Minimal assistance;Sit to/from stand;Sitting/lateral lean       Functional mobility during ADLs: Minimal assistance;Rolling walker (2 wheels)      Extremity/Trunk Assessment              Vision       Perception     Praxis     Communication Communication Communication: No apparent difficulties   Cognition Arousal: Alert Behavior During Therapy: WFL for tasks assessed/performed Cognition: No apparent impairments                               Following commands: Intact        Cueing   Cueing Techniques: Verbal cues  Exercises      Shoulder Instructions       General Comments VSS on RA    Pertinent Vitals/ Pain       Pain Assessment Pain  Assessment: 0-10 Pain Score: 8  Faces Pain Scale: Hurts little more Pain Location: R hip Pain Descriptors / Indicators: Discomfort, Sore, Operative site guarding Pain Intervention(s): Limited activity within patient's tolerance, Monitored during session, Premedicated before session, Repositioned  Home Living                                          Prior Functioning/Environment              Frequency  Min 1X/week        Progress Toward Goals  OT Goals(current goals can  now be found in the care plan section)  Progress towards OT goals: Progressing toward goals  Acute Rehab OT Goals Patient Stated Goal: rehab OT Goal Formulation: With patient Time For Goal Achievement: 03/06/23 Potential to Achieve Goals: Good  Plan      Co-evaluation                 AM-PAC OT "6 Clicks" Daily Activity     Outcome Measure   Help from another person eating meals?: None Help from another person taking care of personal grooming?: A Little Help from another person toileting, which includes using toliet, bedpan, or urinal?: A Little Help from another person bathing (including washing, rinsing, drying)?: A Lot Help from another person to put on and taking off regular upper body clothing?: A Little Help from another person to put on and taking off regular lower body clothing?: A Lot 6 Click Score: 17    End of Session Equipment Utilized During Treatment: Gait belt;Rolling walker (2 wheels)  OT Visit Diagnosis: Other abnormalities of gait and mobility (R26.89);Muscle weakness (generalized) (M62.81);Pain;History of falling (Z91.81) Pain - Right/Left: Right Pain - part of body: Hip   Activity Tolerance Patient tolerated treatment well   Patient Left in chair;with call bell/phone within reach;with chair alarm set   Nurse Communication Mobility status        Time: 8469-6295 OT Time Calculation (min): 18 min  Charges: OT General Charges $OT Visit: 1 Visit OT Treatments $Self Care/Home Management : 8-22 mins  Barry Brunner, OT Acute Rehabilitation Services Office [phone]   Chancy Milroy 02/24/2023, 10:11 AM

## 2023-02-25 DIAGNOSIS — E875 Hyperkalemia: Secondary | ICD-10-CM | POA: Diagnosis not present

## 2023-02-25 DIAGNOSIS — E43 Unspecified severe protein-calorie malnutrition: Secondary | ICD-10-CM | POA: Diagnosis not present

## 2023-02-25 DIAGNOSIS — S72001A Fracture of unspecified part of neck of right femur, initial encounter for closed fracture: Secondary | ICD-10-CM | POA: Diagnosis not present

## 2023-02-25 DIAGNOSIS — S7291XD Unspecified fracture of right femur, subsequent encounter for closed fracture with routine healing: Secondary | ICD-10-CM | POA: Diagnosis not present

## 2023-02-25 DIAGNOSIS — S72001D Fracture of unspecified part of neck of right femur, subsequent encounter for closed fracture with routine healing: Secondary | ICD-10-CM | POA: Diagnosis not present

## 2023-02-25 DIAGNOSIS — Z96641 Presence of right artificial hip joint: Secondary | ICD-10-CM | POA: Diagnosis not present

## 2023-02-25 DIAGNOSIS — I129 Hypertensive chronic kidney disease with stage 1 through stage 4 chronic kidney disease, or unspecified chronic kidney disease: Secondary | ICD-10-CM | POA: Diagnosis not present

## 2023-02-25 DIAGNOSIS — D62 Acute posthemorrhagic anemia: Secondary | ICD-10-CM | POA: Diagnosis not present

## 2023-02-25 DIAGNOSIS — R2689 Other abnormalities of gait and mobility: Secondary | ICD-10-CM | POA: Diagnosis not present

## 2023-02-25 DIAGNOSIS — Z9181 History of falling: Secondary | ICD-10-CM | POA: Diagnosis not present

## 2023-02-25 DIAGNOSIS — M6281 Muscle weakness (generalized): Secondary | ICD-10-CM | POA: Diagnosis not present

## 2023-02-25 DIAGNOSIS — N1832 Chronic kidney disease, stage 3b: Secondary | ICD-10-CM | POA: Diagnosis not present

## 2023-02-25 NOTE — TOC Transition Note (Addendum)
 Transition of Care [facility]) - Discharge Note   Patient Details  Name: Holly Jimenez MRN: [id number] Date of Birth: 03/11/1945  [facility]) CM/SW Contact:  Lorri Frederick, LCSW Phone Number: 02/25/2023, 10:53 AM   Clinical Narrative:   Pt discharging to [facility], room 808p.  RN call [phone].  Pt friend will be transporting, pt will need to be brought out from 5N or DC lounge to the friend's vehicle.    1000: CSW confirmed with [facility] that they can receive pt today.  CSW spoke with pt regarding transportation-pt was able to call friend to provide transport.  Pt still pending BM before she can DC.   Final next level of care: Skilled Nursing Facility Barriers to Discharge: Barriers Resolved   Patient Goals and CMS Choice Patient states their goals for this hospitalization and ongoing recovery are:: Go to SNF STR          Discharge Placement              Patient chooses bed at: [facility] Patient to be transferred to facility by: friend Name of family member notified: unable to reach son or brother    Discharge Plan and Services Additional resources added to the After Visit Summary for   In-house Referral: Clinical Social Work                                   Social Drivers of Health (SDOH) Interventions SDOH Screenings   Food Insecurity: No Food Insecurity (02/19/2023)  Housing: Low Risk  (02/19/2023)  Transportation Needs: No Transportation Needs (02/19/2023)  Utilities: Not At Risk (02/19/2023)  Depression (PHQ2-9): Low Risk  (10/23/2022)  Social Connections: Moderately Isolated (02/19/2023)  Tobacco Use: Medium Risk (02/19/2023)     Readmission Risk Interventions     No data to display

## 2023-02-25 NOTE — Discharge Summary (Signed)
 Physician Discharge Summary  Holly Jimenez RUE:[id number] DOB: 28-Apr-1945 DOA: 02/19/2023  PCP: Irena Reichmann, DO  Admit date: 02/19/2023 Discharge date: 02/25/2023  Admitted From: Home Disposition: Skilled nursing facility  Recommendations for Outpatient Follow-up:  Follow up with PCP in 1-2 weeks Please obtain BMP/CBC in one week Orthopedics to schedule follow-up   Discharge Condition: Stable CODE STATUS: Full code Diet recommendation: Regular diet, nutritional supplements  Discharge summary: 78 year old with history of Raynaud's disease, CKD stage IIIb, GERD, osteoarthritis, hypertension, left total hip 12/2022 presented with mechanical fall and right hip pain.  She was found to have acute right proximal femoral fracture.  Underwent ORIF 2/13.  Stabilized.    Assessment & plan of care:   Closed traumatic right hip fracture: Status post ORIF Dr. Magnus Ivan 2/13-anterior approach total hip arthroplasty. Weightbearing as tolerated Pain management, oral opiates and Tylenol. DVT prophylaxis, aspirin 81 mg twice daily. Work with PT OT.  Refer to SNF for rehab.   Anemia of acute blood loss: chronic underlying anemia.  Baseline hemoglobin 10-11. Hemoglobin 9.4.  Presented with hemoglobin 9.5.  Expected blood loss.  Also with chronic anemia.  Currently no evidence of active bleeding.  Monitor.  Will prescribe iron on discharge. Recheck hemoglobin in 1 week.   CKD stage IIIb: At about baseline.  Creatinine improved to 1.2.  Raynaud's disease: On Plaquenil.  Maintenance prednisone 5 mg daily.  Continue.  Continued.  Patient also on chronic pain management with Lyrica.  This is continued.  GERD: On PPI.  Hypertension: On losartan at home.  Stable.  Patient is medically stable to transfer to a skilled level of care.  Discharge Diagnoses:  Principal Problem:   Hip fracture [facility]) Active Problems:   Closed fracture of neck of right femur [facility])    Discharge  Instructions  Discharge Instructions     Diet - low sodium heart healthy   Complete by: As directed    Increase activity slowly   Complete by: As directed    Increase activity slowly   Complete by: As directed       Allergies as of 02/25/2023       Reactions   Sulfa Antibiotics    Other reaction(s): Other (See Comments) Other Reaction(s): Other (See Comments)   Sulfonamide Derivatives Hives   Methocarbamol    Other reaction(s): nightmares   Pancrelipase (lip-prot-amyl)    Other reaction(s): indigestion   Sulfacetamide Sodium-sulfur    Other reaction(s): swelling        Medication List     STOP taking these medications    amLODipine 5 MG tablet Commonly known as: NORVASC   losartan 50 MG tablet Commonly known as: COZAAR   oxyCODONE 5 MG immediate release tablet Commonly known as: Oxy IR/ROXICODONE       TAKE these medications    acetaminophen 500 MG tablet Commonly known as: TYLENOL Take 2 tablets (1,000 mg total) by mouth every 8 (eight) hours.   ASPERCREME EX Apply 1 Application topically as needed (Pain).   aspirin 81 MG chewable tablet Chew 1 tablet (81 mg total) by mouth 2 (two) times daily.   cetirizine 5 MG tablet Commonly known as: ZYRTEC Take 5 mg by mouth daily.   cholecalciferol 1000 units tablet Commonly known as: VITAMIN D Take 1,000 Units by mouth daily.   clotrimazole-betamethasone cream Commonly known as: LOTRISONE Apply 1 Application topically 2 (two) times daily as needed (Fungus).   cyanocobalamin 1000 MCG tablet Commonly known as: VITAMIN B12 Take 1,000 mcg  by mouth daily.   diclofenac Sodium 1 % Gel Commonly known as: VOLTAREN Apply 4 g topically 4 (four) times daily as needed (Pain).   docusate sodium 100 MG capsule Commonly known as: COLACE Take 1 capsule (100 mg total) by mouth 2 (two) times daily.   estradiol 0.1 MG/GM vaginal cream Commonly known as: ESTRACE Place 1 Applicatorful vaginally daily as needed  (Urinary Urgency).   feeding supplement (NEPRO CARB STEADY) Liqd Take 237 mLs by mouth 2 (two) times daily between meals.   ferrous gluconate 240 (27 FE) MG tablet Commonly known as: FERGON Take 240 mg by mouth 2 (two) times daily.   HYDROcodone-acetaminophen 5-325 MG tablet Commonly known as: NORCO/VICODIN Take 1-2 tablets by mouth every 6 (six) hours as needed for moderate pain (pain score 4-6).   hydroxychloroquine 200 MG tablet Commonly known as: PLAQUENIL Take 200 mg by mouth 2 (two) times daily.   ketoconazole 2 % cream Commonly known as: NIZORAL Apply 1 application topically daily as needed (infection treatment).   lidocaine 4 % Apply 1 patch topically daily as needed. 12 hours on, 12 hours off   mirtazapine 15 MG tablet Commonly known as: REMERON Take 15 mg by mouth at bedtime.   multivitamin with minerals Tabs tablet Take 1 tablet by mouth daily.   omeprazole 20 MG capsule Commonly known as: PRILOSEC Take 20 mg by mouth 2 (two) times daily.   oxybutynin 5 MG tablet Commonly known as: DITROPAN Take 5 mg by mouth 2 (two) times daily.   Phenadoz 25 MG suppository Generic drug: promethazine Place 25 mg rectally every 6 (six) hours as needed for nausea or vomiting.   polyethylene glycol 17 g packet Commonly known as: MIRALAX / GLYCOLAX Take 17 g by mouth daily as needed for mild constipation.   predniSONE 5 MG tablet Commonly known as: DELTASONE Take 5 mg by mouth daily. continuous   pregabalin 75 MG capsule Commonly known as: LYRICA Take 1 capsule (75 mg total) by mouth daily.   pyridOXINE 100 MG tablet Commonly known as: VITAMIN B6 Take 100 mg by mouth daily.   RA Probiotic Digestive Care Caps Take 1 capsule by mouth daily.   VISINE OP Place 2 drops into both eyes as needed (for dry eyes).               Durable Medical Equipment  (From admission, onward)           Start     Ordered   02/19/23 1611  DME 3 n 1  Once       Comments:  Bedside commode , confine to one room   02/19/23 1611   02/19/23 1439  DME Walker rolling  Once       Question Answer Comment  Walker: With 5 Inch Wheels   Patient needs a walker to treat with the following condition Status post total replacement of right hip      02/19/23 1438            Follow-up Information     Irena Reichmann, DO Follow up.   Specialty: Family Medicine Contact information: [address] [place] [address] [place] [address] [phone]         Kathryne Hitch, MD. Schedule an appointment as soon as possible for a visit in 2 week(s).   Specialty: Orthopedic Surgery Contact information: [address] [place] [address] [phone]                Allergies  Allergen Reactions   Sulfa Antibiotics     Other reaction(s): Other (See Comments)  Other Reaction(s): Other (See Comments)   Sulfonamide Derivatives Hives   Methocarbamol     Other reaction(s): nightmares   Pancrelipase (Lip-Prot-Amyl)     Other reaction(s): indigestion   Sulfacetamide Sodium-Sulfur     Other reaction(s): swelling    Consultations: Orthopedics   Procedures/Studies: DG HIP UNILAT WITH PELVIS 1V RIGHT Result Date: 02/19/2023 CLINICAL DATA:  Elective surgery. EXAM: DG HIP (WITH OR WITHOUT PELVIS) 1V RIGHT COMPARISON:  None Available. FINDINGS: Three fluoroscopic spot views of the pelvis and right hip obtained in the operating room. Images during hip arthroplasty. Fluoroscopy time 19.8 seconds. Dose 1.3 mGy. IMPRESSION: Procedural fluoroscopy during right hip arthroplasty. Electronically Signed   By: Narda Rutherford M.D.   On: 02/19/2023 13:02   DG Pelvis Portable Result Date: 02/19/2023 CLINICAL DATA:  Status post right hip replacement. EXAM: PORTABLE PELVIS 1-2 VIEWS COMPARISON:  None Available. FINDINGS: Right hip arthroplasty in expected alignment. No periprosthetic lucency or fracture. Recent postsurgical change includes air and edema in the soft  tissues. Lateral skin staples in place. Previous left hip arthroplasty. IMPRESSION: Right hip arthroplasty without immediate postoperative complication. Electronically Signed   By: Narda Rutherford M.D.   On: 02/19/2023 13:01   DG C-Arm 1-60 Min-No Report Result Date: 02/19/2023 Fluoroscopy was utilized by the requesting physician.  No radiographic interpretation.   Chest Portable 1 View Result Date: 02/19/2023 CLINICAL DATA:  Admission for hip fracture EXAM: PORTABLE CHEST 1 VIEW COMPARISON:  10/12/2013 FINDINGS: Stable cardiomediastinal silhouette. Aortic atherosclerotic calcification. Bronchial wall thickening and diffuse interstitial coarsening. No focal pneumonia. No pleural effusion or pneumothorax. No displaced rib fractures. IMPRESSION: Bronchial wall thickening and diffuse interstitial coarsening which may be due to bronchitis. Electronically Signed   By: Minerva Fester M.D.   On: 02/19/2023 03:56   DG Hip Unilat W or Wo Pelvis 1 View Right Result Date: 02/19/2023 CLINICAL DATA:  Status post fall. EXAM: DG HIP (WITH OR WITHOUT PELVIS) 1V RIGHT COMPARISON:  February 12, 2023 FINDINGS: There is attacked total left hip replacement. An acute fracture deformity is seen extending through the neck of the proximal right femur. There is no evidence of dislocation. A stable deformity of indeterminate age is seen involving the right inferior pubic ramus. Advanced degenerative changes are noted involving the right hip in the form of joint space narrowing and acetabular sclerosis. IMPRESSION: 1. Acute fracture of the proximal right femur. 2. Stable deformity of the right inferior pubic ramus of indeterminate age. 3. Advanced degenerative changes of the right hip. 4. Intact total left hip replacement. Electronically Signed   By: Aram Candela M.D.   On: 02/19/2023 01:53   XR HIP UNILAT W OR W/O PELVIS 2-3 VIEWS RIGHT Result Date: 02/12/2023 AP pelvis and lateral view of the right hip shows no acute  fractures acute findings.  Severely arthritic right hip with bone-on-bone arthritic changes.  Left total hip arthroplasty bone is well-seated.  No signs of acute injury otherwise.  (Echo, Carotid, EGD, Colonoscopy, ERCP)    Subjective: Patient seen and examined.  Denies any complaints.  Pain is controlled.  Agreeable to transfer to [facility].   Discharge Exam: Vitals:   02/25/23 0509 02/25/23 0731  BP: (!) 117/52 (!) 126/46  Pulse: 65 64  Resp:  18  Temp: 98.5 F (36.9 C) 98.4 F (36.9 C)  SpO2: 93% 95%   Vitals:   02/24/23 1357 02/24/23 1936 02/25/23  0509 02/25/23 0731  BP: (!) 115/47 130/64 (!) 117/52 (!) 126/46  Pulse: 63 64 65 64  Resp: 17 18  18   Temp: 98.9 F (37.2 C) 98.2 F (36.8 C) 98.5 F (36.9 C) 98.4 F (36.9 C)  TempSrc: Oral   Oral  SpO2: 91% 95% 93% 95%  Weight:      Height:        General: Pt is alert, awake, not in acute distress Cardiovascular: RRR, S1/S2 +, no rubs, no gallops Respiratory: CTA bilaterally, no wheezing, no rhonchi Abdominal: Soft, NT, ND, bowel sounds + Extremities: no edema, no cyanosis Right lateral thigh incision clean and dry.    The results of significant diagnostics from this hospitalization (including imaging, microbiology, ancillary and laboratory) are listed below for reference.     Microbiology: Recent Results (from the past 240 hours)  Surgical pcr screen     Status: None   Collection Time: 02/19/23  8:41 AM   Specimen: Nasal Mucosa; Nasal Swab  Result Value Ref Range Status   MRSA, PCR NEGATIVE NEGATIVE Final   Staphylococcus aureus NEGATIVE NEGATIVE Final    Comment: (NOTE) The Xpert SA Assay (FDA approved for NASAL specimens in patients 61 years of age and older), is one component of a comprehensive surveillance program. It is not intended to diagnose infection nor to guide or monitor treatment. Performed at [facility], [address] [place], [place] [address]      Labs: BNP (last 3  results) No results for input(s): "BNP" in the last 8760 hours. Basic Metabolic Panel: Recent Labs  Lab 02/19/23 0130 02/19/23 0342 02/21/23 0550  NA 138 138 137  K 3.8 3.8 5.0  CL 107 106 107  CO2 20* 20* 21*  GLUCOSE 93 89 92  BUN 29* 27* 20  CREATININE 1.78* 1.57* 1.21*  CALCIUM 9.2 9.2 9.0   Liver Function Tests: No results for input(s): "AST", "ALT", "ALKPHOS", "BILITOT", "PROT", "ALBUMIN" in the last 168 hours. No results for input(s): "LIPASE", "AMYLASE" in the last 168 hours. No results for input(s): "AMMONIA" in the last 168 hours. CBC: Recent Labs  Lab 02/19/23 0130 02/19/23 0342 02/20/23 0558 02/21/23 0550  WBC 10.6* 9.8 11.2* 10.7*  NEUTROABS 7.9*  --   --   --   HGB 9.5* 9.6* 8.1* 9.4*  HCT 31.8* 32.2* 26.2* 31.2*  MCV 89.8 90.2 88.5 88.1  PLT 285 277 223 225   Cardiac Enzymes: No results for input(s): "CKTOTAL", "CKMB", "CKMBINDEX", "TROPONINI" in the last 168 hours. BNP: Invalid input(s): "POCBNP" CBG: No results for input(s): "GLUCAP" in the last 168 hours. D-Dimer No results for input(s): "DDIMER" in the last 72 hours. Hgb A1c No results for input(s): "HGBA1C" in the last 72 hours. Lipid Profile No results for input(s): "CHOL", "HDL", "LDLCALC", "TRIG", "CHOLHDL", "LDLDIRECT" in the last 72 hours. Thyroid function studies No results for input(s): "TSH", "T4TOTAL", "T3FREE", "THYROIDAB" in the last 72 hours.  Invalid input(s): "FREET3" Anemia work up No results for input(s): "VITAMINB12", "FOLATE", "FERRITIN", "TIBC", "IRON", "RETICCTPCT" in the last 72 hours. Urinalysis    Component Value Date/Time   COLORURINE YELLOW 12/25/2022 0530   APPEARANCEUR CLEAR 12/25/2022 0530   LABSPEC 1.012 12/25/2022 0530   PHURINE 6.0 12/25/2022 0530   GLUCOSEU NEGATIVE 12/25/2022 0530   HGBUR NEGATIVE 12/25/2022 0530   BILIRUBINUR NEGATIVE 12/25/2022 0530   KETONESUR NEGATIVE 12/25/2022 0530   PROTEINUR NEGATIVE 12/25/2022 0530   NITRITE NEGATIVE  12/25/2022 0530   LEUKOCYTESUR NEGATIVE 12/25/2022  0530   Sepsis Labs Recent Labs  Lab 02/19/23 0130 02/19/23 0342 02/20/23 0558 02/21/23 0550  WBC 10.6* 9.8 11.2* 10.7*   Microbiology Recent Results (from the past 240 hours)  Surgical pcr screen     Status: None   Collection Time: 02/19/23  8:41 AM   Specimen: Nasal Mucosa; Nasal Swab  Result Value Ref Range Status   MRSA, PCR NEGATIVE NEGATIVE Final   Staphylococcus aureus NEGATIVE NEGATIVE Final    Comment: (NOTE) The Xpert SA Assay (FDA approved for NASAL specimens in patients 76 years of age and older), is one component of a comprehensive surveillance program. It is not intended to diagnose infection nor to guide or monitor treatment. Performed at [facility], [address], [place], [place] [address]      Time coordinating discharge: 35 minutes  SIGNED:   Dorcas Carrow, MD  [facility] 02/25/2023, 8:24 AM

## 2023-02-25 NOTE — Progress Notes (Signed)
Report given to [facility].  

## 2023-02-26 DIAGNOSIS — Z9181 History of falling: Secondary | ICD-10-CM | POA: Diagnosis not present

## 2023-02-26 DIAGNOSIS — I129 Hypertensive chronic kidney disease with stage 1 through stage 4 chronic kidney disease, or unspecified chronic kidney disease: Secondary | ICD-10-CM | POA: Diagnosis not present

## 2023-02-26 DIAGNOSIS — S72001D Fracture of unspecified part of neck of right femur, subsequent encounter for closed fracture with routine healing: Secondary | ICD-10-CM | POA: Diagnosis not present

## 2023-02-26 DIAGNOSIS — E43 Unspecified severe protein-calorie malnutrition: Secondary | ICD-10-CM | POA: Diagnosis not present

## 2023-03-05 DIAGNOSIS — E875 Hyperkalemia: Secondary | ICD-10-CM | POA: Diagnosis not present

## 2023-03-05 DIAGNOSIS — N1832 Chronic kidney disease, stage 3b: Secondary | ICD-10-CM | POA: Diagnosis not present

## 2023-03-05 DIAGNOSIS — S72001D Fracture of unspecified part of neck of right femur, subsequent encounter for closed fracture with routine healing: Secondary | ICD-10-CM | POA: Diagnosis not present

## 2023-03-05 DIAGNOSIS — Z96641 Presence of right artificial hip joint: Secondary | ICD-10-CM | POA: Diagnosis not present

## 2023-03-09 ENCOUNTER — Encounter: Payer: Self-pay | Admitting: Orthopaedic Surgery

## 2023-03-09 ENCOUNTER — Ambulatory Visit (INDEPENDENT_AMBULATORY_CARE_PROVIDER_SITE_OTHER): Payer: PPO | Admitting: Orthopaedic Surgery

## 2023-03-09 ENCOUNTER — Other Ambulatory Visit: Payer: Self-pay

## 2023-03-09 DIAGNOSIS — Z96641 Presence of right artificial hip joint: Secondary | ICD-10-CM

## 2023-03-09 DIAGNOSIS — M25551 Pain in right hip: Secondary | ICD-10-CM

## 2023-03-09 MED ORDER — HYDROCODONE-ACETAMINOPHEN 5-325 MG PO TABS: 1.0000 | ORAL_TABLET | Freq: Four times a day (QID) | ORAL | 0 refills | Status: DC | PRN

## 2023-03-09 NOTE — Progress Notes (Signed)
 The patient is here today for first postoperative visit status post a right hip replacement to treat an acute right hip displaced femoral neck fracture.  This was a fracture that occurred after a mechanical fall that was an accidental fall.  We actually replaced her left hip several months ago due to the same situation in which she had a mechanical fall injuring her left hip.  She has been coming at a skilled nursing facility and is scheduled to be discharged on Wednesday to home.  I did ask her about osteoporosis medications and she is not on these.  She said her primary care physician at 1 time talked about her having an injection but her kidney doctor said that was not a good idea at that standpoint.  Her right operative hip moves smoothly and fluidly.  Her left hip that we replaced back in December after fracture from mechanical fall also move smoothly.  Her right hip incision looks good.  The staples were removed and Steri-Strips applied.  There is swelling to be expected.  From my standpoint I will see her back in 6 weeks to see how she is doing overall but no x-rays are needed.  Given the fact that she has had multiple falls with fractures, she needs a referral to the osteoporosis clinic.

## 2023-03-11 DIAGNOSIS — D62 Acute posthemorrhagic anemia: Secondary | ICD-10-CM | POA: Diagnosis not present

## 2023-03-11 DIAGNOSIS — S72001D Fracture of unspecified part of neck of right femur, subsequent encounter for closed fracture with routine healing: Secondary | ICD-10-CM | POA: Diagnosis not present

## 2023-03-11 DIAGNOSIS — N1832 Chronic kidney disease, stage 3b: Secondary | ICD-10-CM | POA: Diagnosis not present

## 2023-03-11 DIAGNOSIS — E875 Hyperkalemia: Secondary | ICD-10-CM | POA: Diagnosis not present

## 2023-03-20 DIAGNOSIS — M3589 Other specified systemic involvement of connective tissue: Secondary | ICD-10-CM | POA: Diagnosis not present

## 2023-03-20 DIAGNOSIS — D649 Anemia, unspecified: Secondary | ICD-10-CM | POA: Diagnosis not present

## 2023-03-20 DIAGNOSIS — M810 Age-related osteoporosis without current pathological fracture: Secondary | ICD-10-CM | POA: Diagnosis not present

## 2023-03-20 DIAGNOSIS — M51369 Other intervertebral disc degeneration, lumbar region without mention of lumbar back pain or lower extremity pain: Secondary | ICD-10-CM | POA: Diagnosis not present

## 2023-03-20 DIAGNOSIS — M1990 Unspecified osteoarthritis, unspecified site: Secondary | ICD-10-CM | POA: Diagnosis not present

## 2023-03-20 DIAGNOSIS — M549 Dorsalgia, unspecified: Secondary | ICD-10-CM | POA: Diagnosis not present

## 2023-03-20 DIAGNOSIS — Z79899 Other long term (current) drug therapy: Secondary | ICD-10-CM | POA: Diagnosis not present

## 2023-03-23 DIAGNOSIS — M3589 Other specified systemic involvement of connective tissue: Secondary | ICD-10-CM | POA: Diagnosis not present

## 2023-03-23 DIAGNOSIS — I129 Hypertensive chronic kidney disease with stage 1 through stage 4 chronic kidney disease, or unspecified chronic kidney disease: Secondary | ICD-10-CM | POA: Diagnosis not present

## 2023-03-23 DIAGNOSIS — Z8781 Personal history of (healed) traumatic fracture: Secondary | ICD-10-CM | POA: Diagnosis not present

## 2023-03-23 DIAGNOSIS — Z09 Encounter for follow-up examination after completed treatment for conditions other than malignant neoplasm: Secondary | ICD-10-CM | POA: Diagnosis not present

## 2023-03-23 DIAGNOSIS — Z79899 Other long term (current) drug therapy: Secondary | ICD-10-CM | POA: Diagnosis not present

## 2023-03-30 ENCOUNTER — Ambulatory Visit: Admitting: Physician Assistant

## 2023-03-30 ENCOUNTER — Encounter: Payer: Self-pay | Admitting: Physician Assistant

## 2023-03-30 DIAGNOSIS — M810 Age-related osteoporosis without current pathological fracture: Secondary | ICD-10-CM | POA: Insufficient documentation

## 2023-03-30 DIAGNOSIS — Z96642 Presence of left artificial hip joint: Secondary | ICD-10-CM | POA: Diagnosis not present

## 2023-03-30 DIAGNOSIS — Z96641 Presence of right artificial hip joint: Secondary | ICD-10-CM | POA: Diagnosis not present

## 2023-03-30 DIAGNOSIS — S72042D Displaced fracture of base of neck of left femur, subsequent encounter for closed fracture with routine healing: Secondary | ICD-10-CM | POA: Diagnosis not present

## 2023-03-30 DIAGNOSIS — S72001D Fracture of unspecified part of neck of right femur, subsequent encounter for closed fracture with routine healing: Secondary | ICD-10-CM | POA: Diagnosis not present

## 2023-03-30 NOTE — Progress Notes (Signed)
 Office Visit Note   Patient: Holly Jimenez           Date of Birth: [birth year]           MRN: [id number] Visit Date: 03/30/2023              Requested by: Kathryne Hitch, MD [address] [place],  [place] [address] PCP: Irena Reichmann, DO   Assessment & Plan: Visit Diagnoses:  1. Age-related osteoporosis without current pathological fracture     Plan: [name] is a pleasant 78 year old woman who comes in as a referral from Dr. Magnus Ivan.  She has had history of bilateral hip fractures for mechanical falls in the last few months.  Her current BMI is 17.6.  She does have chronic kidney disease and at 1 time was considered for injectable osteoporosis medication I suspect this is Reclast.  She was told this was not a good idea given her kidney function.  She has no history of heart attack heart disease or stroke.  She has never taken osteoporosis medications in the past.  No history of cancer.  She does have a history of kidney disease she has never had ulcers or gastric by says she has no history of reflux or seizures.  She does not take any calcium though her last calcium was adequate at 9.0 she does take it 1000 international units of vitamin D but is unsure of her vitamin D level.  She is a former smoker she does not consume alcoholic beverages she does do some walking stretching 4 times a week.  She did have a recent bone density done at [facility] will facilitate getting a copy of this as well as testing her vitamin D and thyroid function.  She is high risk for further fractures given her low BMI and her history of 2 hip fractures in the last couple months.  Also former smoker.  Although she would not be appropriate for Reclast I do think she would do well with Evenity or Prolia.  Will discuss that further once I have the results of her bone density scan.  She has had to be in rehab when she is fractured her hip however normally she lives at home with  caregivers 40 minutes was spent discussing options and giving her material  Follow-Up Instructions: No follow-ups on file.   Orders:  No orders of the defined types were placed in this encounter.  No orders of the defined types were placed in this encounter.     Procedures: No procedures performed   Clinical Data: No additional findings.   Subjective: No chief complaint on file.   HPI patient is a pleasant 78 year old woman who is referred from Dr. Magnus Ivan.  She has a history of bilateral hip replacements in the last year or so after mechanical falls causing fractures.  She is not currently taking any osteoporosis medication  Review of Systems  All other systems reviewed and are negative.    Objective: Vital Signs: There were no vitals taken for this visit.  Physical Exam Constitutional:      Appearance: Normal appearance.  Pulmonary:     Effort: Pulmonary effort is normal.  Skin:    General: Skin is warm and dry.  Neurological:     General: No focal deficit present.     Mental Status: She is alert and oriented to person, place, and time.  Psychiatric:        Mood and Affect: Mood normal.  Behavior: Behavior normal.       Specialty Comments:  No specialty comments available.  Imaging: No results found.   PMFS History: Patient Active Problem List   Diagnosis Date Noted   Age-related osteoporosis without current pathological fracture 03/30/2023   Hip fracture (HCC) 02/19/2023   Closed fracture of neck of right femur (HCC) 12/25/2022   Protein-calorie malnutrition, severe 12/25/2022   Acute pain of left hip 12/24/2022   CKD stage 3b, GFR 30-44 ml/min (HCC) 12/24/2022   Abnormal TSH 05/31/2019   Gait abnormality 05/30/2019   Paresthesia 05/30/2019   Low back pain without sciatica 05/30/2019   Subdural hematoma (HCC) 11/05/2016   Compression fracture of body of thoracic vertebra (HCC) 03/08/2014   Neuropathy 02/16/2014   Ulcer of the stomach  and intestine 02/16/2014   Raynaud disease 02/16/2014   Abnormal ECG 02/16/2014   Essential hypertension 02/16/2014   Abnormal EKG 02/16/2014   NONSPECIFIC ABN FINDING RAD & OTH EXAM GI TRACT 08/22/2008   Past Medical History:  Diagnosis Date   Abnormal LFTs    Allergic rhinitis    Anemia    Anxiety    Arthritis    Chronic back pain    Chronic headaches    Chronic kidney disease, unspecified    DDD (degenerative disc disease)    GERD (gastroesophageal reflux disease)    Headache    Hypercalcemia    Hyperkalemia    Hyperlipidemia    Hypertension    KIDNEY SPECIALIST TOOK OFF BENICAR   IBS (irritable bowel syndrome)    Lupus    Mixed connective tissue disease (HCC)    Nausea    Neuropathy    Peptic ulcer disease    Raynaud's disease    Small kidney    left   Vitamin D deficiency disease     Family History  Problem Relation Age of Onset   Ovarian cancer Sister    Colon cancer Sister    Heart disease Paternal Grandfather    Heart disease Maternal Grandfather    Kidney disease Brother    Kidney disease Mother    Hypertension Mother    Kidney disease Maternal Grandmother    Diabetes Paternal Grandmother    Asthma Father    Suicidality Father    Alcoholism Father     Past Surgical History:  Procedure Laterality Date   ABDOMINAL HYSTERECTOMY  1975   APPENDECTOMY  1972   FOOT SURGERY     LEFT   KYPHOPLASTY Bilateral 03/08/2014   Procedure: Thoracic nine Kyphoplasty;  Surgeon: Coletta Memos, MD;  Location: [facility];  Service: Neurosurgery;  Laterality: Bilateral;  Thoracic nine Kyphoplasty   LIPOMA EXCISION     back   TONSILLECTOMY     TOTAL HIP ARTHROPLASTY Left 12/26/2022   Procedure: TOTAL HIP ARTHROPLASTY ANTERIOR APPROACH;  Surgeon: Kathryne Hitch, MD;  Location: [facility];  Service: Orthopedics;  Laterality: Left;   TOTAL HIP ARTHROPLASTY Right 02/19/2023   Procedure: TOTAL HIP ARTHROPLASTY ANTERIOR APPROACH;  Surgeon: Kathryne Hitch, MD;   Location: [facility];  Service: Orthopedics;  Laterality: Right;   WRIST GANGLION EXCISION     left   Social History   Occupational History   Occupation: retired    Associate Professor: [employer]  Tobacco Use   Smoking status: Former    Current packs/day: 0.00    Average packs/day: 1 pack/day for 25.0 years (25.0 ttl pk-yrs)    Types: Cigarettes    Start date: 03/06/1975  Quit date: 03/05/2000    Years since quitting: 23.0   Smokeless tobacco: Never  Vaping Use   Vaping status: Never Used  Substance and Sexual Activity   Alcohol use: Yes    Comment: Rare   Drug use: No    Frequency: 2.0 times per week    Types: Marijuana    Comment: previous use of marijuana for pain   Sexual activity: Not on file

## 2023-03-31 LAB — UNLABELED

## 2023-03-31 LAB — HOUSE ACCOUNT TRACKING

## 2023-03-31 LAB — VITAMIN D 25 HYDROXY (VIT D DEFICIENCY, FRACTURES): Vit D, 25-Hydroxy: 62 ng/mL (ref 30–100)

## 2023-04-01 ENCOUNTER — Other Ambulatory Visit: Payer: Self-pay | Admitting: Physician Assistant

## 2023-04-06 ENCOUNTER — Encounter: Payer: Self-pay | Admitting: Registered Nurse

## 2023-04-06 ENCOUNTER — Encounter: Attending: Registered Nurse | Admitting: Registered Nurse

## 2023-04-06 VITALS — BP 159/65 | HR 64 | Ht 61.0 in | Wt 93.8 lb

## 2023-04-06 DIAGNOSIS — M545 Low back pain, unspecified: Secondary | ICD-10-CM

## 2023-04-06 DIAGNOSIS — M25512 Pain in left shoulder: Secondary | ICD-10-CM

## 2023-04-06 DIAGNOSIS — M5412 Radiculopathy, cervical region: Secondary | ICD-10-CM

## 2023-04-06 DIAGNOSIS — G894 Chronic pain syndrome: Secondary | ICD-10-CM

## 2023-04-06 DIAGNOSIS — Z79899 Other long term (current) drug therapy: Secondary | ICD-10-CM

## 2023-04-06 DIAGNOSIS — Z5181 Encounter for therapeutic drug level monitoring: Secondary | ICD-10-CM

## 2023-04-06 DIAGNOSIS — M25551 Pain in right hip: Secondary | ICD-10-CM | POA: Diagnosis not present

## 2023-04-06 DIAGNOSIS — M542 Cervicalgia: Secondary | ICD-10-CM | POA: Diagnosis not present

## 2023-04-06 DIAGNOSIS — M2550 Pain in unspecified joint: Secondary | ICD-10-CM | POA: Diagnosis not present

## 2023-04-06 DIAGNOSIS — G8929 Other chronic pain: Secondary | ICD-10-CM | POA: Diagnosis not present

## 2023-04-06 DIAGNOSIS — M25511 Pain in right shoulder: Secondary | ICD-10-CM

## 2023-04-06 NOTE — Progress Notes (Signed)
 Subjective:    Patient ID: Holly Jimenez, female    DOB: 05/19/1945, 78 y.o.   MRN: [id number]  HPI: Holly Jimenez is a 78 y.o. female who returns for follow up appointment for chronic pain and medication refill. She states  her pain is located in her neck radiating into her bilateral shoulders, lower back, right hip pain and generalized joint pain. She also reports last week she had increase intensity of a headache with increase neck pain radiating into her bilateral shoulders, she reports her physical therapist was in her home, he called her PCP and Rheumatologist. She states she didn't seek medical treatment at thetime, she was instructed if this occurs again to seek medical attention, she verbalizes understanding. Also states today she has a  slight headache , X-rays ordered, she verbalizes understanding. She rates her pain 3. Her current exercise regime is walking with walker in her home.   Holly Jimenez  was admitted to hospital on 12/24/2022 and discharged home on 12/30/2022, discharge summary was reviewed.  Per Discharge Summary: 12/18, Dr Celedonio Savage: patient presented to the ED with sudden onset of left hip pain after mechanical fall.  She was admitted on 02/19/2023 : Dr Maisie Fus Discharge Note: Patient now presents to ED s/p fall at [facility] with hip pain.  and discharged on 02/25/2023 for Hip Fracture , discharge summary was reviewed.   S/P on 12/26/2022: Dr Magnus Ivan:  TOTAL HIP ARTHROPLASTY ANTERIOR APPROACH Left General   S/P on 02/19/2023: Dr Magnus Ivan TOTAL HIP ARTHROPLASTY ANTERIOR APPROACH Right   Dr Magnus Ivan was Prescribing her Oxycodone and Hydrocodone, due to the above, Holly Jimenez reports she has resumed her Hydrocodone. She will have a Oral Swab today, we will resume her Hydrocodone if drug screen is consistent. She verbalizes understanding.      Pain Inventory Average Pain 10 Pain Right Now 3 My pain is sharp, aching, and shooting  In the last 24  hours, has pain interfered with the following? General activity 0 Relation with others 0 Enjoyment of life 0 What TIME of day is your pain at its worst? varies Sleep (in general) Poor  Pain is worse with: unsure Pain improves with:  massaging Relief from Meds:  .  Family History  Problem Relation Age of Onset   Ovarian cancer Sister    Colon cancer Sister    Heart disease Paternal Grandfather    Heart disease Maternal Grandfather    Kidney disease Brother    Kidney disease Mother    Hypertension Mother    Kidney disease Maternal Grandmother    Diabetes Paternal Grandmother    Asthma Father    Suicidality Father    Alcoholism Father    Social History   Socioeconomic History   Marital status: Divorced    Spouse name: Not on file   Number of children: 1   Years of education: Associates   Highest education level: Not on file  Occupational History   Occupation: retired    Associate Professor: [employer]  Tobacco Use   Smoking status: Former    Current packs/day: 0.00    Average packs/day: 1 pack/day for 25.0 years (25.0 ttl pk-yrs)    Types: Cigarettes    Start date: 03/06/1975    Quit date: 03/05/2000    Years since quitting: 23.1   Smokeless tobacco: Never  Vaping Use   Vaping status: Never Used  Substance and Sexual Activity   Alcohol use: Yes    Comment: Rare   Drug use:  No    Frequency: 2.0 times per week    Types: Marijuana    Comment: previous use of marijuana for pain   Sexual activity: Not on file  Other Topics Concern   Not on file  Social History Narrative   Lives at home alone.   Right-handed.   Rare use of caffeine.   Social Drivers of Corporate investment banker Strain: Not on file  Food Insecurity: No Food Insecurity (02/19/2023)   Hunger Vital Sign    Worried About Running Out of Food in the Last Year: Never true    Ran Out of Food in the Last Year: Never true  Transportation Needs: No Transportation Needs (02/19/2023)   PRAPARE -  Administrator, Civil Service (Medical): No    Lack of Transportation (Non-Medical): No  Physical Activity: Not on file  Stress: Not on file  Social Connections: Moderately Isolated (02/19/2023)   Social Connection and Isolation Panel [NHANES]    Frequency of Communication with Friends and Family: More than three times a week    Frequency of Social Gatherings with Friends and Family: More than three times a week    Attends Religious Services: More than 4 times per year    Active Member of [employer] or Organizations: No    Attends Banker Meetings: Never    Marital Status: Widowed   Past Surgical History:  Procedure Laterality Date   ABDOMINAL HYSTERECTOMY  1975   APPENDECTOMY  1972   FOOT SURGERY     LEFT   KYPHOPLASTY Bilateral 03/08/2014   Procedure: Thoracic nine Kyphoplasty;  Surgeon: Coletta Memos, MD;  Location: [facility];  Service: Neurosurgery;  Laterality: Bilateral;  Thoracic nine Kyphoplasty   LIPOMA EXCISION     back   TONSILLECTOMY     TOTAL HIP ARTHROPLASTY Left 12/26/2022   Procedure: TOTAL HIP ARTHROPLASTY ANTERIOR APPROACH;  Surgeon: Kathryne Hitch, MD;  Location: [facility];  Service: Orthopedics;  Laterality: Left;   TOTAL HIP ARTHROPLASTY Right 02/19/2023   Procedure: TOTAL HIP ARTHROPLASTY ANTERIOR APPROACH;  Surgeon: Kathryne Hitch, MD;  Location: [facility];  Service: Orthopedics;  Laterality: Right;   WRIST GANGLION EXCISION     left   Past Surgical History:  Procedure Laterality Date   ABDOMINAL HYSTERECTOMY  1975   APPENDECTOMY  1972   FOOT SURGERY     LEFT   KYPHOPLASTY Bilateral 03/08/2014   Procedure: Thoracic nine Kyphoplasty;  Surgeon: Coletta Memos, MD;  Location: [facility];  Service: Neurosurgery;  Laterality: Bilateral;  Thoracic nine Kyphoplasty   LIPOMA EXCISION     back   TONSILLECTOMY     TOTAL HIP ARTHROPLASTY Left 12/26/2022   Procedure: TOTAL HIP ARTHROPLASTY ANTERIOR APPROACH;  Surgeon: Kathryne Hitch, MD;  Location: [facility];  Service: Orthopedics;  Laterality: Left;   TOTAL HIP ARTHROPLASTY Right 02/19/2023   Procedure: TOTAL HIP ARTHROPLASTY ANTERIOR APPROACH;  Surgeon: Kathryne Hitch, MD;  Location: [facility];  Service: Orthopedics;  Laterality: Right;   WRIST GANGLION EXCISION     left   Past Medical History:  Diagnosis Date   Abnormal LFTs    Allergic rhinitis    Anemia    Anxiety    Arthritis    Chronic back pain    Chronic headaches    Chronic kidney disease, unspecified    DDD (degenerative disc disease)    GERD (gastroesophageal reflux disease)    Headache    Hypercalcemia  Hyperkalemia    Hyperlipidemia    Hypertension    KIDNEY SPECIALIST TOOK OFF BENICAR   IBS (irritable bowel syndrome)    Lupus    Mixed connective tissue disease (HCC)    Nausea    Neuropathy    Peptic ulcer disease    Raynaud's disease    Small kidney    left   Vitamin D deficiency disease    BP (!) 182/65   Pulse 63   Ht 5\' 1"  (1.549 m)   Wt 93 lb 12.8 oz (42.5 kg)   SpO2 95%   BMI 17.72 kg/m   Opioid Risk Score:   Fall Risk Score:  `1  Depression screen [facility] 2/9     10/23/2022   11:36 AM 08/14/2022   10:24 AM 06/12/2022   11:36 AM 05/20/2022   11:09 AM 05/13/2022   11:44 AM 03/25/2022   11:16 AM 02/27/2022   11:16 AM  Depression screen PHQ 2/9  Decreased Interest 0 0 0 0 0 0 0  Down, Depressed, Hopeless 0 0 0 0 0 0 0  PHQ - 2 Score 0 0 0 0 0 0 0     Review of Systems  Musculoskeletal:  Positive for neck pain.       Bilateral shoulder pain  Neurological:  Positive for headaches.  All other systems reviewed and are negative.      Objective:   Physical Exam Vitals and nursing note reviewed.  Constitutional:      Appearance: Normal appearance.  Neck:     Comments: Cervical Paraspinal Tenderness: C-5-C-6 Cardiovascular:     Rate and Rhythm: Normal rate and regular rhythm.     Pulses: Normal pulses.     Heart sounds: Normal heart sounds.  Pulmonary:      Effort: Pulmonary effort is normal.     Breath sounds: Normal breath sounds.  Musculoskeletal:     Comments: Normal Muscle Bulk and Muscle Testing Reveals:  Upper Extremities:Decreased  ROM 90 Degrees and Muscle Strength 5/5 Left AC Joint Tenderness Lower Extremities : Full ROM and Muscle Strength 5/5 Arrived in Wheelchair      Skin:    General: Skin is warm and dry.  Neurological:     Mental Status: She is alert and oriented to person, place, and time.  Psychiatric:        Mood and Affect: Mood normal.        Behavior: Behavior normal.           Assessment & Plan:  Cervicalgia/ Cervical Radiculitis: RX: Cervical X-ray Bilateral Shoulder Pain : She denies falling. We will Order X-rays, she verbalizes understanding.  Lumbar Spinal Stenosis: . Continue HEP as Tolerated . Continue to monitor. 04/06/2023 Chronic Low Back Pain without sciatica:. Continue HEP as Tolerated. Continue current medication regimen. 04/06/2023 Intercostal Neuralgia:  No complaints today. Continue Lyrica. Continue to Monitor.04/06/2023 Chronic Pain Syndrome: Continue Hydrocodone 5mg /325 one tablet twice a day  as needed for pain #60.   We will continue the opioid monitoring program, this consists of regular clinic visits, examinations, urine drug screen, pill counts as well as use of [place] Controlled Substance Reporting system. A 12 month History has been reviewed on the [place] Controlled Substance Reporting System on 03/31/20245 .Cervicalgia/ Cervical Radiculitis:  Continue HEP as tolerated. Continue current medication regimen. Continue to monitor. 04/06/2023 9. Polyarthralgia: Continue HEP as Tolerated and Continue to Monitor. 04/06/2023 10. Right Hip Pain: S/P : S/P on 02/19/2023: Dr Magnus Ivan. Ortho Following  TOTAL HIP ARTHROPLASTY ANTERIOR APPROACH Right  11. Left Hip Pain: S/P: S/P on 12/26/2022: Dr Magnus Ivan: Ortho Following.  TOTAL HIP ARTHROPLASTY ANTERIOR APPROACH Left General    F/U in 2 months: This was discussed with Dr Carlis Abbott she agrees with every 2 month visits.

## 2023-04-08 LAB — DRUG TOX MONITOR 1 W/CONF, ORAL FLD
Amphetamines: NEGATIVE ng/mL (ref <10)
Barbiturates: NEGATIVE ng/mL (ref <10)
Benzodiazepines: NEGATIVE ng/mL (ref <0.50)
Buprenorphine: NEGATIVE ng/mL (ref <0.10)
Cocaine: NEGATIVE ng/mL (ref <5.0)
Codeine: NEGATIVE ng/mL (ref <2.5)
Dihydrocodeine: 4.1 ng/mL — ABNORMAL HIGH (ref <2.5)
Fentanyl: NEGATIVE ng/mL (ref <0.10)
Heroin Metabolite: NEGATIVE ng/mL (ref <1.0)
Hydrocodone: 57.6 ng/mL — ABNORMAL HIGH (ref <2.5)
Hydromorphone: NEGATIVE ng/mL (ref <2.5)
MARIJUANA: NEGATIVE ng/mL (ref <2.5)
MDMA: NEGATIVE ng/mL (ref <10)
Meprobamate: NEGATIVE ng/mL (ref <2.5)
Methadone: NEGATIVE ng/mL (ref <5.0)
Morphine: NEGATIVE ng/mL (ref <2.5)
Nicotine Metabolite: NEGATIVE ng/mL (ref <5.0)
Norhydrocodone: 4.4 ng/mL — ABNORMAL HIGH (ref <2.5)
Noroxycodone: NEGATIVE ng/mL (ref <2.5)
Opiates: POSITIVE ng/mL — AB (ref <2.5)
Oxycodone: NEGATIVE ng/mL (ref <2.5)
Oxymorphone: NEGATIVE ng/mL (ref <2.5)
Phencyclidine: NEGATIVE ng/mL (ref <10)
Tapentadol: NEGATIVE ng/mL (ref <5.0)
Tramadol: NEGATIVE ng/mL (ref <5.0)
Zolpidem: NEGATIVE ng/mL (ref <5.0)

## 2023-04-08 LAB — DRUG TOX ALC METAB W/CON, ORAL FLD: Alcohol Metabolite: NEGATIVE ng/mL (ref <25)

## 2023-04-13 ENCOUNTER — Ambulatory Visit: Admitting: Physician Assistant

## 2023-04-13 ENCOUNTER — Telehealth: Payer: Self-pay | Admitting: Registered Nurse

## 2023-04-13 NOTE — Telephone Encounter (Signed)
 Patient called in requesting medication refill on HYDROcodone-acetaminophen (NORCO/VICODIN) 5-325 MG tablet , uses [facility]

## 2023-04-14 ENCOUNTER — Telehealth: Payer: Self-pay | Admitting: Registered Nurse

## 2023-04-14 MED ORDER — HYDROCODONE-ACETAMINOPHEN 5-325 MG PO TABS: 1.0000 | ORAL_TABLET | Freq: Two times a day (BID) | ORAL | 0 refills | Status: DC | PRN

## 2023-04-14 NOTE — Telephone Encounter (Signed)
 PMP was Reviewed:  Hydrocodone e-scribed to pharmacy.  Call placed to Holly Jimenez, no answer. Left message to return the call.

## 2023-04-20 ENCOUNTER — Telehealth: Payer: Self-pay | Admitting: Physician Assistant

## 2023-04-20 ENCOUNTER — Ambulatory Visit
Admission: RE | Admit: 2023-04-20 | Discharge: 2023-04-20 | Disposition: A | Discharge status: Home / Self Care | Source: Ambulatory Visit | Attending: Registered Nurse | Admitting: Registered Nurse

## 2023-04-20 ENCOUNTER — Ambulatory Visit: Admitting: Orthopaedic Surgery

## 2023-04-20 ENCOUNTER — Encounter: Payer: Self-pay | Admitting: Orthopaedic Surgery

## 2023-04-20 DIAGNOSIS — Z96641 Presence of right artificial hip joint: Secondary | ICD-10-CM

## 2023-04-20 DIAGNOSIS — M25511 Pain in right shoulder: Secondary | ICD-10-CM | POA: Diagnosis not present

## 2023-04-20 DIAGNOSIS — M542 Cervicalgia: Secondary | ICD-10-CM | POA: Diagnosis not present

## 2023-04-20 DIAGNOSIS — M25512 Pain in left shoulder: Secondary | ICD-10-CM | POA: Diagnosis not present

## 2023-04-20 NOTE — Telephone Encounter (Signed)
 Pt wanted to notify Kevon Pellegrini that pt's  nephrologist dr said no to injections. No call back needed. If any questions pt phone number is [phone].

## 2023-04-20 NOTE — Progress Notes (Signed)
 The patient is a 78 year old female who is seen in follow-up 2 months status post a right hip replacement to treat an acute right hip fracture.  She is almost 4 months out from a left hip replacement to treat an acute left hip fracture.  She had had a series of mechanical falls.  She has seen the osteoporosis specialist who recommended some type of injection but her kidney physician said that was not appropriate for her due to kidney disease.  She does report some groin pain.  She ambulates minimally.  Her leg lengths are equal.  Both hips move smoothly and fluidly with no pain in the groin at all.  Both hip incisions have healed nicely.  She will still go slow and only ever be up with an assistive device such as a walker.  We will see her back in 6 weeks and we will just have a standing AP pelvis at that visit.  I have recommended increasing her protein intake as well.

## 2023-04-27 ENCOUNTER — Telehealth: Payer: Self-pay | Admitting: Registered Nurse

## 2023-04-27 ENCOUNTER — Encounter: Admitting: Physical Medicine and Rehabilitation

## 2023-04-27 NOTE — Telephone Encounter (Signed)
 Good afternoon Dr Alessandra Ancona, Can you review Ms. Eskenazi bilateral shoulder X-rays. Let me know if you have suggestions.  Thanks

## 2023-04-28 ENCOUNTER — Encounter: Attending: Registered Nurse | Admitting: Physical Medicine and Rehabilitation

## 2023-04-28 DIAGNOSIS — M75102 Unspecified rotator cuff tear or rupture of left shoulder, not specified as traumatic: Secondary | ICD-10-CM

## 2023-04-28 NOTE — Addendum Note (Signed)
 Addended by: Liam Redhead on: 04/28/2023 05:20 PM   Modules accepted: Level of Service

## 2023-04-28 NOTE — Progress Notes (Addendum)
 Subjective:    Patient ID: Holly Jimenez, female    DOB: 03-21-1945, 78 y.o.   MRN: [id number]  An audio/video tele-health visit is felt to be the most appropriate encounter for this patient at this time. This is a follow up tele-visit via phone. The patient is at home. MD is at office. Prior to scheduling this appointment, our staff discussed the limitations of evaluation and management by telemedicine and the availability of in-person appointments. The patient expressed understanding and agreed to proceed.   HPI: ETHELMAE RINGEL is a 78 y.o. female who returns for follow up appointment for chronic pain and medication refill. She states her pain is located in her lower back. She rates her pain 7. Her current exercise regime is walking and performing stretching exercises.  1) Compression fracture: -she has had good relief from the [facility] -pain is improved but still present Ms. Canizales Morphine  equivalent is 5.00 MME.She is also prescribed Alprazolam   by Dr. Hazeline Lister .We have discussed the black box warning of using opioids and benzodiazepines. I highlighted the dangers of using these drugs together and discussed the adverse events including respiratory suppression, overdose, cognitive impairment and importance of compliance with current regimen. We will continue to monitor and adjust as indicated.  she is being closely monitored and under the care of her psychiatrist.   Last UDS was Performed on 10/22/2021, it was consistent.   She does not find the lyrica  helpful   Hydrocodone  helps  Lumbar spine pain improved tremendously  Thoracic spine pain is more noticeable.   Received a pain journal  2) Constipation: -does have constipation sometimes from the opioids  3) Anemia: -asks if this can cause thinning hair  4) Migraine -stable  5) Intercostal neuralgia: -stable  6) Bilateral shoulder pain: -she has pain in both shoulders -she has a left sided rotator  cuff tear, chronic -she asks what may have caused this tear as she does not remember any injury -her pain is currently well managed with her current pain medication -she is not interested in surgery   Pain Inventory Average Pain 8 Pain Right Now 4 My pain is sharp, stabbing, and aching  In the last 24 hours, has pain interfered with the following? General activity 7 Relation with others 0 Enjoyment of life 9 What TIME of day is your pain at its worst? daytime and evening Sleep (in general) Good  Pain is worse with: standing/some activities Pain improves with: rest, heat/ice, and medication Relief from Meds: 5  Family History  Problem Relation Age of Onset   Ovarian cancer Sister    Colon cancer Sister    Heart disease Paternal Grandfather    Heart disease Maternal Grandfather    Kidney disease Brother    Kidney disease Mother    Hypertension Mother    Kidney disease Maternal Grandmother    Diabetes Paternal Grandmother    Asthma Father    Suicidality Father    Alcoholism Father    Social History   Socioeconomic History   Marital status: Divorced    Spouse name: Not on file   Number of children: 1   Years of education: Associates   Highest education level: Not on file  Occupational History   Occupation: retired    Associate Professor: [employer]  Tobacco Use   Smoking status: Former    Current packs/day: 0.00    Average packs/day: 1 pack/day for 25.0 years (25.0 ttl pk-yrs)    Types: Cigarettes  Start date: 03/06/1975    Quit date: 03/05/2000    Years since quitting: 23.1   Smokeless tobacco: Never  Vaping Use   Vaping status: Never Used  Substance and Sexual Activity   Alcohol use: Yes    Comment: Rare   Drug use: No    Frequency: 2.0 times per week    Types: Marijuana    Comment: previous use of marijuana for pain   Sexual activity: Not on file  Other Topics Concern   Not on file  Social History Narrative   Lives at home alone.   Right-handed.    Rare use of caffeine.   Social Drivers of Corporate investment banker Strain: Not on file  Food Insecurity: No Food Insecurity (02/19/2023)   Hunger Vital Sign    Worried About Running Out of Food in the Last Year: Never true    Ran Out of Food in the Last Year: Never true  Transportation Needs: No Transportation Needs (02/19/2023)   PRAPARE - Administrator, Civil Service (Medical): No    Lack of Transportation (Non-Medical): No  Physical Activity: Not on file  Stress: Not on file  Social Connections: Moderately Isolated (02/19/2023)   Social Connection and Isolation Panel [NHANES]    Frequency of Communication with Friends and Family: More than three times a week    Frequency of Social Gatherings with Friends and Family: More than three times a week    Attends Religious Services: More than 4 times per year    Active Member of [employer] or Organizations: No    Attends Banker Meetings: Never    Marital Status: Widowed   Past Surgical History:  Procedure Laterality Date   ABDOMINAL HYSTERECTOMY  1975   APPENDECTOMY  1972   FOOT SURGERY     LEFT   KYPHOPLASTY Bilateral 03/08/2014   Procedure: Thoracic nine Kyphoplasty;  Surgeon: Audie Bleacher, MD;  Location: [facility];  Service: Neurosurgery;  Laterality: Bilateral;  Thoracic nine Kyphoplasty   LIPOMA EXCISION     back   TONSILLECTOMY     TOTAL HIP ARTHROPLASTY Left 12/26/2022   Procedure: TOTAL HIP ARTHROPLASTY ANTERIOR APPROACH;  Surgeon: Arnie Lao, MD;  Location: [facility];  Service: Orthopedics;  Laterality: Left;   TOTAL HIP ARTHROPLASTY Right 02/19/2023   Procedure: TOTAL HIP ARTHROPLASTY ANTERIOR APPROACH;  Surgeon: Arnie Lao, MD;  [facility];  Service: Orthopedics;  Laterality: Right;   WRIST GANGLION EXCISION     left   Past Surgical History:  Procedure Laterality Date   ABDOMINAL HYSTERECTOMY  1975   APPENDECTOMY  1972   FOOT SURGERY     LEFT   KYPHOPLASTY Bilateral  03/08/2014   Procedure: Thoracic nine Kyphoplasty;  Surgeon: Audie Bleacher, MD;  [facility]: [facility];  Service: Neurosurgery;  Laterality: Bilateral;  Thoracic nine Kyphoplasty   LIPOMA EXCISION     back   TONSILLECTOMY     TOTAL HIP ARTHROPLASTY Left 12/26/2022   Procedure: TOTAL HIP ARTHROPLASTY ANTERIOR APPROACH;  Surgeon: Arnie Lao, MD;  [facility];  Service: Orthopedics;  Laterality: Left;   TOTAL HIP ARTHROPLASTY Right 02/19/2023   Procedure: TOTAL HIP ARTHROPLASTY ANTERIOR APPROACH;  Surgeon: Arnie Lao, MD;  [facility]: [facility];  Service: Orthopedics;  Laterality: Right;   WRIST GANGLION EXCISION     left   Past Medical History:  Diagnosis Date   Abnormal LFTs    Allergic rhinitis    Anemia  Anxiety    Arthritis    Chronic back pain    Chronic headaches    Chronic kidney disease, unspecified    DDD (degenerative disc disease)    GERD (gastroesophageal reflux disease)    Headache    Hypercalcemia    Hyperkalemia    Hyperlipidemia    Hypertension    KIDNEY SPECIALIST TOOK OFF BENICAR   IBS (irritable bowel syndrome)    Lupus    Mixed connective tissue disease (HCC)    Nausea    Neuropathy    Peptic ulcer disease    Raynaud's disease    Small kidney    left   Vitamin D  deficiency disease    There were no vitals taken for this visit.  Opioid Risk Score:   Fall Risk Score:  `1  Depression screen [facility] 2/9     10/23/2022   11:36 AM 08/14/2022   10:24 AM 06/12/2022   11:36 AM 05/20/2022   11:09 AM 05/13/2022   11:44 AM 03/25/2022   11:16 AM 02/27/2022   11:16 AM  Depression screen [facility] 2/9  Decreased Interest 0 0 0 0 0 0 0  Down, Depressed, Hopeless 0 0 0 0 0 0 0  PHQ - 2 Score 0 0 0 0 0 0 0       Review of Systems  Musculoskeletal:  Positive for neck pain.  All other systems reviewed and are negative.      Objective:  PRIOR EXAM:  Gen: no distress, normal appearing HEENT: oral mucosa pink and moist, NCAT Cardio: Reg  rate Chest: normal effort, normal rate of breathing Abd: soft, non-distended Ext: no edema Psych: pleasant, normal affect Skin: intact Neuro: Alert and oriented x3 Musculoskeletal: 5/5 strength throughout     Assessment & Plan:  Lumbar Spinal Stenosis: Continue HEP as Tolerated . Continue to monitor. 11/14/2021 Chronic Low Back Pain without sciatica: Completed shockwave therapy with good results Continue HEP as Tolerated. Continue current medication regimen. 11/14/2021 Intercostal Neuralgia:  No complaints today. Continue Lyrica . Continue to Monitor.11/14/2021 Chronic Pain Syndrome: Refilled  Hydrocodone  5mg /325 one tablet daily as needed for pain #30. Second script sent for the following month.  -We will continue the opioid monitoring program, this consists of regular clinic visits, examinations, urine drug screen, pill counts as well as use of [place]  Controlled Substance Reporting system. A 12 month History has been reviewed on the [place]  Controlled Substance Reporting System on 11/14/2021. Aaron AasCervicalgia/ Cervical Radiculitis: Continue Lyrica . Continue HEP as tolerated. Continue current medication regimen. Continue to monitor. 11/14/2021 Left Shoulder Tendonitis:  Continue to alternate Ice and Heat Therapy. Continue to Monitor. 11/14/2021 Thoracic spine pain: discussed and is stable  Migraines: discussed and are stable   Right hip pain: recommended red light therapy, Percocet increased to BID  11. Anemia: discussed food sources of iron   12. Intercostal neuralgia: discussed that this is stable  13. Constipation:  -Provided list of following foods that help with constipation and highlighted a few: 1) prunes- contain high amounts of fiber.  2) apples- has a form of dietary fiber called pectin that accelerates stool movement and increases beneficial gut bacteria 3) pears- in addition to fiber, also high in fructose and sorbitol  which have laxative effect 4) figs- contain  an enzyme ficin which helps to speed colonic transit 5) kiwis- contain an enzyme actinidin that improves gut motility and reduces constipation 6) oranges- rich in pectin (like apples) 7) grapefruits- contain a flavanol naringenin which has a laxative effect  8) vegetables- rich in fiber and also great sources of folate, vitamin C, and K 9) artichoke- high in inulin, prebiotic great for the microbiome 10) chicory- increases stool frequency and softness (can be added to coffee) 11) rhubarb- laxative effect 12) sweet potato- high fiber 13) beans, peas, and lentils- contain both soluble and insoluble fiber 14) chia seeds- improves intestinal health and gut flora 15) flaxseeds- laxative effect 16) whole grain rye bread- high in fiber 17) oat bran- high in soluble and insoluble fiber 18) kefir- softens stools -recommended to try at least one of these foods every day.  -drink 6-8 glasses of water  per day -walk regularly, especially after meals.    14) Bilateral shoulder pain: -discussed that XR of left shoulder shows chronic rotator cuff tear -discussed that her pain is well controlled with her current medication regimen  5 minutes spent in discussion that XR of her left shoulder shows a chronic left rotator cuff tear, discussed that her pain is well controlled with her current medication regimen

## 2023-05-05 NOTE — Progress Notes (Signed)
 In error

## 2023-05-07 DIAGNOSIS — N2581 Secondary hyperparathyroidism of renal origin: Secondary | ICD-10-CM | POA: Diagnosis not present

## 2023-05-07 DIAGNOSIS — N184 Chronic kidney disease, stage 4 (severe): Secondary | ICD-10-CM | POA: Diagnosis not present

## 2023-05-07 DIAGNOSIS — I129 Hypertensive chronic kidney disease with stage 1 through stage 4 chronic kidney disease, or unspecified chronic kidney disease: Secondary | ICD-10-CM | POA: Diagnosis not present

## 2023-05-07 DIAGNOSIS — N179 Acute kidney failure, unspecified: Secondary | ICD-10-CM | POA: Diagnosis not present

## 2023-05-07 DIAGNOSIS — N189 Chronic kidney disease, unspecified: Secondary | ICD-10-CM | POA: Diagnosis not present

## 2023-05-07 DIAGNOSIS — E875 Hyperkalemia: Secondary | ICD-10-CM | POA: Diagnosis not present

## 2023-05-07 DIAGNOSIS — D631 Anemia in chronic kidney disease: Secondary | ICD-10-CM | POA: Diagnosis not present

## 2023-05-11 DIAGNOSIS — N184 Chronic kidney disease, stage 4 (severe): Secondary | ICD-10-CM | POA: Diagnosis not present

## 2023-05-14 ENCOUNTER — Telehealth: Payer: Self-pay | Admitting: Registered Nurse

## 2023-05-14 MED ORDER — HYDROCODONE-ACETAMINOPHEN 5-325 MG PO TABS: 1.0000 | ORAL_TABLET | Freq: Two times a day (BID) | ORAL | 0 refills | Status: DC | PRN

## 2023-05-14 NOTE — Telephone Encounter (Signed)
 Patient calling to request a refill of her hydrocodone . She would like the rx sent to [facility] pharmacy [facility], she needs the medication delivered).

## 2023-05-14 NOTE — Telephone Encounter (Signed)
 PMP was Reviewed.  Hydrocodone  e-scribed to pharmacy,  Call placed to Ms. Denk regarding the above, she verbalizes understanding.

## 2023-05-19 NOTE — Telephone Encounter (Signed)
 Over-ride approved for pain medication. Patient informed.

## 2023-05-21 ENCOUNTER — Other Ambulatory Visit: Payer: Self-pay | Admitting: Physical Medicine and Rehabilitation

## 2023-05-27 ENCOUNTER — Other Ambulatory Visit: Payer: Self-pay | Admitting: Physical Medicine and Rehabilitation

## 2023-06-03 ENCOUNTER — Encounter: Payer: Self-pay | Admitting: Registered Nurse

## 2023-06-03 ENCOUNTER — Encounter: Attending: Registered Nurse | Admitting: Registered Nurse

## 2023-06-03 VITALS — BP 174/75 | HR 56 | Ht 61.0 in | Wt 93.0 lb

## 2023-06-03 DIAGNOSIS — Z5181 Encounter for therapeutic drug level monitoring: Secondary | ICD-10-CM | POA: Insufficient documentation

## 2023-06-03 DIAGNOSIS — M2550 Pain in unspecified joint: Secondary | ICD-10-CM | POA: Diagnosis not present

## 2023-06-03 DIAGNOSIS — I10 Essential (primary) hypertension: Secondary | ICD-10-CM | POA: Insufficient documentation

## 2023-06-03 DIAGNOSIS — G894 Chronic pain syndrome: Secondary | ICD-10-CM | POA: Insufficient documentation

## 2023-06-03 DIAGNOSIS — G8929 Other chronic pain: Secondary | ICD-10-CM | POA: Insufficient documentation

## 2023-06-03 DIAGNOSIS — M5412 Radiculopathy, cervical region: Secondary | ICD-10-CM | POA: Insufficient documentation

## 2023-06-03 DIAGNOSIS — M542 Cervicalgia: Secondary | ICD-10-CM | POA: Diagnosis not present

## 2023-06-03 DIAGNOSIS — Z79899 Other long term (current) drug therapy: Secondary | ICD-10-CM | POA: Diagnosis not present

## 2023-06-03 DIAGNOSIS — M545 Low back pain, unspecified: Secondary | ICD-10-CM | POA: Insufficient documentation

## 2023-06-03 MED ORDER — HYDROCODONE-ACETAMINOPHEN 5-325 MG PO TABS: 1.0000 | ORAL_TABLET | Freq: Two times a day (BID) | ORAL | 0 refills | Status: DC | PRN

## 2023-06-03 NOTE — Progress Notes (Unsigned)
 Subjective:    Patient ID: Holly Jimenez, female    DOB: 05/18/1945, 78 y.o.   MRN: [id number]  HPI: Holly Jimenez is a 78 y.o. female who returns for follow up appointment for chronic pain and medication refill. states *** pain is located in  ***. rates pain ***. current exercise regime is walking and performing stretching exercises.  [name] Morphine  equivalent is 10.00 MME.   Last Oral Swab was Perormed on 04/06/2023, it was consistent.     Pain Inventory Average Pain 10 Pain Right Now 5 My pain is intermittent, sharp, dull, stabbing, tingling, aching, and Numbnesds  In the last 24 hours, has pain interfered with the following? General activity 10 Relation with others 8 Enjoyment of life 10 What TIME of day is your pain at its worst? morning , daytime, evening, night, and varies Sleep (in general) Good  Pain is worse with: walking, bending, sitting, inactivity, standing, and some activites Pain improves with: heat/ice and medication Relief from Meds: 9  Family History  Problem Relation Age of Onset   Ovarian cancer Sister    Colon cancer Sister    Heart disease Paternal Grandfather    Heart disease Maternal Grandfather    Kidney disease Brother    Kidney disease Mother    Hypertension Mother    Kidney disease Maternal Grandmother    Diabetes Paternal Grandmother    Asthma Father    Suicidality Father    Alcoholism Father    Social History   Socioeconomic History   Marital status: Divorced    Spouse name: Not on file   Number of children: 1   Years of education: Associates   Highest education level: Not on file  Occupational History   Occupation: retired    Associate Professor: [employer]  Tobacco Use   Smoking status: Former    Current packs/day: 0.00    Average packs/day: 1 pack/day for 25.0 years (25.0 ttl pk-yrs)    Types: Cigarettes    Start date: 03/06/1975    Quit date: 03/05/2000    Years since quitting: 23.2    Smokeless tobacco: Never  Vaping Use   Vaping status: Never Used  Substance and Sexual Activity   Alcohol use: Yes    Comment: Rare   Drug use: No    Frequency: 2.0 times per week    Types: Marijuana    Comment: previous use of marijuana for pain   Sexual activity: Not on file  Other Topics Concern   Not on file  Social History Narrative   Lives at home alone.   Right-handed.   Rare use of caffeine.   Social Drivers of Corporate investment banker Strain: Not on file  Food Insecurity: No Food Insecurity (02/19/2023)   Hunger Vital Sign    Worried About Running Out of Food in the Last Year: Never true    Ran Out of Food in the Last Year: Never true  Transportation Needs: No Transportation Needs (02/19/2023)   PRAPARE - Administrator, Civil Service (Medical): No    Lack of Transportation (Non-Medical): No  Physical Activity: Not on file  Stress: Not on file  Social Connections: Moderately Isolated (02/19/2023)   Social Connection and Isolation Panel [NHANES]    Frequency of Communication with Friends and Family: More than three times a week    Frequency of Social Gatherings with Friends and Family: More than three times a week    Attends Religious Services:  More than 4 times per year    Active Member of Clubs or Organizations: No    Attends Banker Meetings: Never    Marital Status: Widowed   Past Surgical History:  Procedure Laterality Date   ABDOMINAL HYSTERECTOMY  1975   APPENDECTOMY  1972   FOOT SURGERY     LEFT   KYPHOPLASTY Bilateral 03/08/2014   Procedure: Thoracic nine Kyphoplasty;  Surgeon: Audie Bleacher, MD;  [facility];  Service: Neurosurgery;  Laterality: Bilateral;  Thoracic nine Kyphoplasty   LIPOMA EXCISION     back   TONSILLECTOMY     TOTAL HIP ARTHROPLASTY Left 12/26/2022   Procedure: TOTAL HIP ARTHROPLASTY ANTERIOR APPROACH;  Surgeon: Arnie Lao, MD;  [facility];  Service: Orthopedics;  Laterality:  Left;   TOTAL HIP ARTHROPLASTY Right 02/19/2023   Procedure: TOTAL HIP ARTHROPLASTY ANTERIOR APPROACH;  Surgeon: Arnie Lao, MD;  [facility];  Service: Orthopedics;  Laterality: Right;   WRIST GANGLION EXCISION     left   Past Surgical History:  Procedure Laterality Date   ABDOMINAL HYSTERECTOMY  1975   APPENDECTOMY  1972   FOOT SURGERY     LEFT   KYPHOPLASTY Bilateral 03/08/2014   Procedure: Thoracic nine Kyphoplasty;  Surgeon: Audie Bleacher, MD;  [facility];  Service: Neurosurgery;  Laterality: Bilateral;  Thoracic nine Kyphoplasty   LIPOMA EXCISION     back   TONSILLECTOMY     TOTAL HIP ARTHROPLASTY Left 12/26/2022   Procedure: TOTAL HIP ARTHROPLASTY ANTERIOR APPROACH;  Surgeon: Arnie Lao, MD;  [facility];  Service: Orthopedics;  Laterality: Left;   TOTAL HIP ARTHROPLASTY Right 02/19/2023   Procedure: TOTAL HIP ARTHROPLASTY ANTERIOR APPROACH;  Surgeon: Arnie Lao, MD;  [facility];  Service: Orthopedics;  Laterality: Right;   WRIST GANGLION EXCISION     left   Past Medical History:  Diagnosis Date   Abnormal LFTs    Allergic rhinitis    Anemia    Anxiety    Arthritis    Chronic back pain    Chronic headaches    Chronic kidney disease, unspecified    DDD (degenerative disc disease)    GERD (gastroesophageal reflux disease)    Headache    Hypercalcemia    Hyperkalemia    Hyperlipidemia    Hypertension    KIDNEY SPECIALIST TOOK OFF BENICAR   IBS (irritable bowel syndrome)    Lupus    Mixed connective tissue disease (HCC)    Nausea    Neuropathy    Peptic ulcer disease    Raynaud's disease    Small kidney    left   Vitamin D  deficiency disease    BP (!) 171/67 (BP Location: Left Arm, Patient Position: Sitting) Comment: 174/69 in right arm  Pulse (!) 56   Ht 5\' 1"  (1.549 m)   Wt 93 lb (42.2 kg)   SpO2 97%   BMI 17.57 kg/m   Opioid Risk Score:   Fall Risk Score:  `1  Depression screen [facility]  2/9     10/23/2022   11:36 AM 08/14/2022   10:24 AM 06/12/2022   11:36 AM 05/20/2022   11:09 AM 05/13/2022   11:44 AM 03/25/2022   11:16 AM 02/27/2022   11:16 AM  Depression screen PHQ 2/9  Decreased Interest 0 0 0 0 0 0 0  Down, Depressed, Hopeless 0 0 0 0 0 0 0  PHQ - 2 Score 0 0  0 0 0 0 0     Review of Systems     Objective:   Physical Exam        Assessment & Plan:  Cervicalgia/ Cervical Radiculitis: RX: Cervical X-ray Bilateral Shoulder Pain : She denies falling. We will Order X-rays, she verbalizes understanding.  Lumbar Spinal Stenosis: . Continue HEP as Tolerated . Continue to monitor. 04/06/2023 Chronic Low Back Pain without sciatica:. Continue HEP as Tolerated. Continue current medication regimen. 04/06/2023 Intercostal Neuralgia:  No complaints today. Continue Lyrica . Continue to Monitor.04/06/2023 Chronic Pain Syndrome: Continue Hydrocodone  5mg /325 one tablet twice a day  as needed for pain #60.   We will continue the opioid monitoring program, this consists of regular clinic visits, examinations, urine drug screen, pill counts as well as use of [place]  Controlled Substance Reporting system. A 12 month History has been reviewed on the [place]  Controlled Substance Reporting System on 03/31/20245 .Cervicalgia/ Cervical Radiculitis:  Continue HEP as tolerated. Continue current medication regimen. Continue to monitor. 04/06/2023 9. Polyarthralgia: Continue HEP as Tolerated and Continue to Monitor. 04/06/2023 10. Right Hip Pain: S/P : S/P on 02/19/2023: Dr Lucienne Ryder. Ortho Following  TOTAL HIP ARTHROPLASTY ANTERIOR APPROACH Right  11. Left Hip Pain: S/P: S/P on 12/26/2022: Dr Lucienne Ryder: Ortho Following.  TOTAL HIP ARTHROPLASTY ANTERIOR APPROACH Left General    F/U in 2 months: This was discussed with Dr Alessandra Ancona she agrees with every 2 month visits.

## 2023-06-04 ENCOUNTER — Other Ambulatory Visit (INDEPENDENT_AMBULATORY_CARE_PROVIDER_SITE_OTHER): Payer: Self-pay

## 2023-06-04 ENCOUNTER — Encounter: Payer: Self-pay | Admitting: Orthopaedic Surgery

## 2023-06-04 ENCOUNTER — Ambulatory Visit: Payer: PPO | Admitting: Orthopaedic Surgery

## 2023-06-04 DIAGNOSIS — Z96643 Presence of artificial hip joint, bilateral: Secondary | ICD-10-CM

## 2023-06-04 NOTE — Progress Notes (Signed)
 The patient is a 78 year old female who is here today in follow-up status post a right hip replacement back in February to treat a displaced femoral neck fracture.  We actually replaced her left hip back in December secondary to hip fracture.  Both fractures occurred after mechanical fall.  She does ambulate using a walker at home.  She has no complaints.  She is a frail and thin 78 year old female.  On exam she does move both hips the range of motion easily.  Her ligaments are equal.  She has no discomfort.  Standing AP pelvis shows well-seated bilateral total hip arthroplasties with no complicating features.  From an orthopedic standpoint, she can follow-up as needed.  She is doing well otherwise.  We have encouraged her to only ambulate with an assist device given her fall risk.  She agrees with that as well.  All questions and concerns were addressed and answered

## 2023-06-15 ENCOUNTER — Ambulatory Visit: Admitting: Orthopaedic Surgery

## 2023-07-17 ENCOUNTER — Telehealth: Payer: Self-pay | Admitting: Registered Nurse

## 2023-07-17 NOTE — Telephone Encounter (Signed)
 Patient is needing a refill of HYDROcodone -acetaminophen  (NORCO/VICODIN) 5-325 MG tablet , says she is going to run out today. [facility] [address], [place] [id number].

## 2023-07-17 NOTE — Telephone Encounter (Signed)
 error

## 2023-07-17 NOTE — Telephone Encounter (Signed)
 Prescription was already sent to pharmacy , call placed to [name]. [facility] and they are in the process of filling her prescription. Ms. Barba was called regarding the above, she verbalizes understanding.

## 2023-07-27 DIAGNOSIS — M1990 Unspecified osteoarthritis, unspecified site: Secondary | ICD-10-CM | POA: Diagnosis not present

## 2023-07-27 DIAGNOSIS — M51369 Other intervertebral disc degeneration, lumbar region without mention of lumbar back pain or lower extremity pain: Secondary | ICD-10-CM | POA: Diagnosis not present

## 2023-07-27 DIAGNOSIS — M810 Age-related osteoporosis without current pathological fracture: Secondary | ICD-10-CM | POA: Diagnosis not present

## 2023-07-27 DIAGNOSIS — M3589 Other specified systemic involvement of connective tissue: Secondary | ICD-10-CM | POA: Diagnosis not present

## 2023-07-27 DIAGNOSIS — Z79899 Other long term (current) drug therapy: Secondary | ICD-10-CM | POA: Diagnosis not present

## 2023-07-30 NOTE — Progress Notes (Signed)
 Subjective:    Patient ID: Holly Jimenez, female    DOB: 07/03/1945, 78 y.o.   MRN: [id number]  HPI: Holly Jimenez is a 78 y.o. female who returns for follow up appointment for chronic pain and medication refill. She states her pain is located in her neck radiating left shoulder and lower back pain. She rates  her pain 5. Her current exercise regime is walking and performing stretching exercises.  Holly Jimenez Morphine  equivalent is 10.00 MME.   Oral Swab was Performed today.    Pain Inventory Average Pain 7 Pain Right Now 5 My pain is constant and sharp  In the last 24 hours, has pain interfered with the following? General activity 8 Relation with others 0 Enjoyment of life 9 What TIME of day is your pain at its worst? varies Sleep (in general) Good  Pain is worse with: standing Pain improves with: heat/ice and medication Relief from Meds: 5  Family History  Problem Relation Age of Onset   Ovarian cancer Sister    Colon cancer Sister    Heart disease Paternal Grandfather    Heart disease Maternal Grandfather    Kidney disease Brother    Kidney disease Mother    Hypertension Mother    Kidney disease Maternal Grandmother    Diabetes Paternal Grandmother    Asthma Father    Suicidality Father    Alcoholism Father    Social History   Socioeconomic History   Marital status: Divorced    Spouse name: Not on file   Number of children: 1   Years of education: Associates   Highest education level: Not on file  Occupational History   Occupation: retired    Associate Professor: [employer]  Tobacco Use   Smoking status: Former    Current packs/day: 0.00    Average packs/day: 1 pack/day for 25.0 years (25.0 ttl pk-yrs)    Types: Cigarettes    Start date: 03/06/1975    Quit date: 03/05/2000    Years since quitting: 23.4   Smokeless tobacco: Never  Vaping Use   Vaping status: Never Used  Substance and Sexual Activity   Alcohol use: Yes     Comment: Rare   Drug use: No    Frequency: 2.0 times per week    Types: Marijuana    Comment: previous use of marijuana for pain   Sexual activity: Not on file  Other Topics Concern   Not on file  Social History Narrative   Lives at home alone.   Right-handed.   Rare use of caffeine.   Social Drivers of Corporate investment banker Strain: Not on file  Food Insecurity: No Food Insecurity (02/19/2023)   Hunger Vital Sign    Worried About Running Out of Food in the Last Year: Never true    Ran Out of Food in the Last Year: Never true  Transportation Needs: No Transportation Needs (02/19/2023)   PRAPARE - Administrator, Civil Service (Medical): No    Lack of Transportation (Non-Medical): No  Physical Activity: Not on file  Stress: Not on file  Social Connections: Moderately Isolated (02/19/2023)   Social Connection and Isolation Panel    Frequency of Communication with Friends and Family: More than three times a week    Frequency of Social Gatherings with Friends and Family: More than three times a week    Attends Religious Services: More than 4 times per year    Active Member of [employer]  or Organizations: No    Attends Banker Meetings: Never    Marital Status: Widowed   Past Surgical History:  Procedure Laterality Date   ABDOMINAL HYSTERECTOMY  1975   APPENDECTOMY  1972   FOOT SURGERY     LEFT   KYPHOPLASTY Bilateral 03/08/2014   Procedure: Thoracic nine Kyphoplasty;  Surgeon: Rockey Peru, MD;  Location: [facility];  Service: Neurosurgery;  Laterality: Bilateral;  Thoracic nine Kyphoplasty   LIPOMA EXCISION     back   TONSILLECTOMY     TOTAL HIP ARTHROPLASTY Left 12/26/2022   Procedure: TOTAL HIP ARTHROPLASTY ANTERIOR APPROACH;  Surgeon: Vernetta Lonni GRADE, MD;  Location: [facility];  Service: Orthopedics;  Laterality: Left;   TOTAL HIP ARTHROPLASTY Right 02/19/2023   Procedure: TOTAL HIP ARTHROPLASTY ANTERIOR APPROACH;  Surgeon: Vernetta Lonni GRADE, MD;  Location: [facility];  Service: Orthopedics;  Laterality: Right;   WRIST GANGLION EXCISION     left   Past Surgical History:  Procedure Laterality Date   ABDOMINAL HYSTERECTOMY  1975   APPENDECTOMY  1972   FOOT SURGERY     LEFT   KYPHOPLASTY Bilateral 03/08/2014   Procedure: Thoracic nine Kyphoplasty;  Surgeon: Rockey Peru, MD;  Location: [facility];  Service: Neurosurgery;  Laterality: Bilateral;  Thoracic nine Kyphoplasty   LIPOMA EXCISION     back   TONSILLECTOMY     TOTAL HIP ARTHROPLASTY Left 12/26/2022   Procedure: TOTAL HIP ARTHROPLASTY ANTERIOR APPROACH;  Surgeon: Vernetta Lonni GRADE, MD;  Location: [facility];  Service: Orthopedics;  Laterality: Left;   TOTAL HIP ARTHROPLASTY Right 02/19/2023   Procedure: TOTAL HIP ARTHROPLASTY ANTERIOR APPROACH;  Surgeon: Vernetta Lonni GRADE, MD;  Location: [facility];  Service: Orthopedics;  Laterality: Right;   WRIST GANGLION EXCISION     left   Past Medical History:  Diagnosis Date   Abnormal LFTs    Allergic rhinitis    Anemia    Anxiety    Arthritis    Chronic back pain    Chronic headaches    Chronic kidney disease, unspecified    DDD (degenerative disc disease)    GERD (gastroesophageal reflux disease)    Headache    Hypercalcemia    Hyperkalemia    Hyperlipidemia    Hypertension    KIDNEY SPECIALIST TOOK OFF BENICAR   IBS (irritable bowel syndrome)    Lupus    Mixed connective tissue disease (HCC)    Nausea    Neuropathy    Peptic ulcer disease    Raynaud's disease    Small kidney    left   Vitamin D  deficiency disease    BP (!) 153/70 (BP Location: Left Arm, Patient Position: Sitting, Cuff Size: Normal)   Pulse 61   Ht 5' 1 (1.549 m)   Wt 95 lb 6.4 oz (43.3 kg)   SpO2 98%   BMI 18.03 kg/m   Opioid Risk Score:   Fall Risk Score:  `1  Depression screen [facility] 2/9     07/31/2023   11:22 AM 06/03/2023   11:19 AM 10/23/2022   11:36 AM 08/14/2022   10:24 AM 06/12/2022   11:36 AM 05/20/2022   11:09 AM  05/13/2022   11:44 AM  Depression screen PHQ 2/9  Decreased Interest 0 0 0 0 0 0 0  Down, Depressed, Hopeless 0 0 0 0 0 0 0  PHQ - 2 Score 0 0 0 0 0 0 0    Review of Systems  Musculoskeletal:  Neck Head Left shoulder Lower back  All other systems reviewed and are negative.      Objective:   Physical Exam Vitals and nursing note reviewed.  Constitutional:      Appearance: Normal appearance.  Neck:     Comments: Cervical Paraspinal Tenderness: C-5-C-6 Cardiovascular:     Rate and Rhythm: Normal rate and regular rhythm.     Pulses: Normal pulses.     Heart sounds: Normal heart sounds.  Pulmonary:     Effort: Pulmonary effort is normal.     Breath sounds: Normal breath sounds.  Musculoskeletal:     Comments: Normal Muscle Bulk and Muscle Testing Reveals:  Upper Extremities: Full ROM and Muscle Strength: 5/5 Left AC Joint Tenderness  Lumbar Paraspinal Tenderness: L-4-L-5 Lower Extremities: Full ROM and Muscle Strength 5/5 Arises from Table slowly, Transfer to wheelchair     Skin:    General: Skin is warm and dry.  Neurological:     Mental Status: She is alert and oriented to person, place, and time.  Psychiatric:        Mood and Affect: Mood normal.        Behavior: Behavior normal.          Assessment & Plan:  Cervicalgia/ Cervical Radiculitis: Continue HEP as Tolerated. Continue to Monitor. 07/31/2023 Left Shoulder Pain : Continue HEP as Tolerated. Continue to Monitor. 07/31/2023 Lumbar Spinal Stenosis: . Continue HEP as Tolerated . Continue to monitor. 07/31/2023 Chronic Low Back Pain without sciatica:. Continue HEP as Tolerated. Continue current medication regimen. 07/31/2023 Intercostal Neuralgia:  No complaints today. Continue Lyrica . Continue to Monitor.07/31/2023 Chronic Pain Syndrome: Continue Hydrocodone  5mg /325 one tablet twice a day  as needed for pain #60.  Second script sent or the ollowing month.  We will continue the opioid monitoring program,  this consists of regular clinic visits, examinations, urine drug screen, pill counts as well as use of [place]  Controlled Substance Reporting system. A 12 month History has been reviewed on the [place]  Controlled Substance Reporting System on 07/25/20245 8. Polyarthralgia: Continue HEP as Tolerated and Continue to Monitor. 07/31/2023 10. Right Hip Pain: S/P : S/P on 02/19/2023: Dr Vernetta. Ortho Following  TOTAL HIP ARTHROPLASTY ANTERIOR APPROACH Right  11. Left Hip Pain: S/P: S/P on 12/26/2022: Dr Vernetta: Ortho Following.  TOTAL HIP ARTHROPLASTY ANTERIOR APPROACH Left General  12. Uncontrolled Hypertension: Medication list Reviewed. Blood Pressure re-checked, she refused ED or Urgent Care Evaluation. Holly Jimenez states she will  follow up with her PCP. She will keep a blood Pressure Log.    F/U in 2 months:

## 2023-07-31 ENCOUNTER — Encounter: Payer: Self-pay | Admitting: Registered Nurse

## 2023-07-31 ENCOUNTER — Encounter: Attending: Registered Nurse | Admitting: Registered Nurse

## 2023-07-31 VITALS — BP 189/78 | HR 61 | Ht 61.0 in | Wt 95.4 lb

## 2023-07-31 DIAGNOSIS — M5412 Radiculopathy, cervical region: Secondary | ICD-10-CM | POA: Diagnosis not present

## 2023-07-31 DIAGNOSIS — Z79899 Other long term (current) drug therapy: Secondary | ICD-10-CM | POA: Insufficient documentation

## 2023-07-31 DIAGNOSIS — M542 Cervicalgia: Secondary | ICD-10-CM | POA: Insufficient documentation

## 2023-07-31 DIAGNOSIS — Z5181 Encounter for therapeutic drug level monitoring: Secondary | ICD-10-CM | POA: Diagnosis not present

## 2023-07-31 DIAGNOSIS — M545 Low back pain, unspecified: Secondary | ICD-10-CM | POA: Insufficient documentation

## 2023-07-31 DIAGNOSIS — M75102 Unspecified rotator cuff tear or rupture of left shoulder, not specified as traumatic: Secondary | ICD-10-CM | POA: Diagnosis not present

## 2023-07-31 DIAGNOSIS — G894 Chronic pain syndrome: Secondary | ICD-10-CM | POA: Insufficient documentation

## 2023-07-31 DIAGNOSIS — G8929 Other chronic pain: Secondary | ICD-10-CM | POA: Insufficient documentation

## 2023-07-31 MED ORDER — HYDROCODONE-ACETAMINOPHEN 5-325 MG PO TABS
1.0000 | ORAL_TABLET | Freq: Two times a day (BID) | ORAL | 0 refills | Status: DC | PRN
Start: 2023-07-31 — End: 2023-07-31

## 2023-07-31 MED ORDER — HYDROCODONE-ACETAMINOPHEN 5-325 MG PO TABS
1.0000 | ORAL_TABLET | Freq: Two times a day (BID) | ORAL | 0 refills | Status: DC | PRN
Start: 2023-07-31 — End: 2023-10-05

## 2023-08-05 LAB — DRUG TOX MONITOR 1 W/CONF, ORAL FLD

## 2023-08-05 LAB — DRUG TOX ALC METAB W/CON, ORAL FLD: Alcohol Metabolite: NEGATIVE ng/mL (ref <25)

## 2023-08-25 DIAGNOSIS — Z0000 Encounter for general adult medical examination without abnormal findings: Secondary | ICD-10-CM | POA: Diagnosis not present

## 2023-08-26 ENCOUNTER — Ambulatory Visit (INDEPENDENT_AMBULATORY_CARE_PROVIDER_SITE_OTHER): Admitting: Orthopaedic Surgery

## 2023-08-26 DIAGNOSIS — M75101 Unspecified rotator cuff tear or rupture of right shoulder, not specified as traumatic: Secondary | ICD-10-CM

## 2023-08-26 DIAGNOSIS — M12811 Other specific arthropathies, not elsewhere classified, right shoulder: Secondary | ICD-10-CM | POA: Diagnosis not present

## 2023-08-26 DIAGNOSIS — M12812 Other specific arthropathies, not elsewhere classified, left shoulder: Secondary | ICD-10-CM | POA: Diagnosis not present

## 2023-08-26 MED ORDER — METHYLPREDNISOLONE ACETATE 40 MG/ML IJ SUSP
40.0000 mg | INTRAMUSCULAR | Status: AC | PRN
  Administered 2023-08-26: 40 mg via INTRA_ARTICULAR

## 2023-08-26 MED ORDER — LIDOCAINE HCL 1 % IJ SOLN
3.0000 mL | INTRAMUSCULAR | Status: AC | PRN
  Administered 2023-08-26: 3 mL

## 2023-08-26 MED ORDER — TIZANIDINE HCL 2 MG PO TABS: 2.0000 mg | ORAL_TABLET | Freq: Three times a day (TID) | ORAL | 1 refills | Status: AC | PRN

## 2023-08-26 NOTE — Progress Notes (Signed)
 The patient is a 78 year old female who is referred from [name] Ned nurse practitioner to evaluate the patient's left shoulder but she does have some issues with both her shoulders and her neck.  X-rays were ordered of her cervical spine and both shoulders back in April.  She says today she is dealing with more of a headache and some neck pain but also left shoulder pain.  She states she cannot lift her arms above her head well and has been going on for a long period of time.  On exam she does have stiffness of the cervical spine on lateral rotation and lateral bending as well as flexion and extension.  Both shoulders shows evidence of rotator cuff arthropathy with grinding at the glenohumeral joint and limited abduction as well as forward flexion.  X-rays were reviewed of both shoulders and they show profound end-stage rotator cuff arthropathy with high riding humeral heads that are articulating with the undersurface the acromion on both shoulders and significant loss of joint space consistent with chronic rotator cuff disease and rotator cuff arthropathy.  This is significant arthritic issue with both shoulders.  Her cervical spine also shows arthritis.  I did recommend a steroid injection in her left shoulder subacromial outlet which she agreed to and tolerated well since that was hurting her the most.  I do think a low-dose muscle relaxant such as tizanidine  could be helpful for her as well.  She is not interested in a surgical referral in terms of sending her to a shoulder specialist for a reverse shoulder replacement and would like to try conservative treatment first.  I did place a steroid injection easily in her left shoulder.  She knows to wait at least 3 months between steroid injections.  All questions and concerns were answered and addressed.  Follow-up for now will be as needed.    Procedure Note  Patient: Holly Jimenez             Date of Birth: [birth year]           MRN:  [id number]             Visit Date: 08/26/2023  Procedures: Visit Diagnoses:  1. Right rotator cuff tear arthropathy   2. Rotator cuff arthropathy of left shoulder     Large Joint Inj: L subacromial bursa on 08/26/2023 8:52 AM Indications: pain and diagnostic evaluation Details: 22 G 1.5 in needle  Arthrogram: No  Medications: 3 mL lidocaine  1 %; 40 mg methylPREDNISolone  acetate 40 MG/ML Outcome: tolerated well, no immediate complications Procedure, treatment alternatives, risks and benefits explained, specific risks discussed. Consent was given by the patient. Immediately prior to procedure a time out was called to verify the correct patient, procedure, equipment, support staff and site/side marked as required. Patient was prepped and draped in the usual sterile fashion.

## 2023-08-28 DIAGNOSIS — I129 Hypertensive chronic kidney disease with stage 1 through stage 4 chronic kidney disease, or unspecified chronic kidney disease: Secondary | ICD-10-CM | POA: Diagnosis not present

## 2023-08-28 DIAGNOSIS — N179 Acute kidney failure, unspecified: Secondary | ICD-10-CM | POA: Diagnosis not present

## 2023-08-28 DIAGNOSIS — E875 Hyperkalemia: Secondary | ICD-10-CM | POA: Diagnosis not present

## 2023-08-28 DIAGNOSIS — D631 Anemia in chronic kidney disease: Secondary | ICD-10-CM | POA: Diagnosis not present

## 2023-08-28 DIAGNOSIS — N184 Chronic kidney disease, stage 4 (severe): Secondary | ICD-10-CM | POA: Diagnosis not present

## 2023-08-28 DIAGNOSIS — N2581 Secondary hyperparathyroidism of renal origin: Secondary | ICD-10-CM | POA: Diagnosis not present

## 2023-08-28 DIAGNOSIS — N189 Chronic kidney disease, unspecified: Secondary | ICD-10-CM | POA: Diagnosis not present

## 2023-09-16 DIAGNOSIS — Z79899 Other long term (current) drug therapy: Secondary | ICD-10-CM | POA: Diagnosis not present

## 2023-09-16 DIAGNOSIS — H43813 Vitreous degeneration, bilateral: Secondary | ICD-10-CM | POA: Diagnosis not present

## 2023-09-16 DIAGNOSIS — H35033 Hypertensive retinopathy, bilateral: Secondary | ICD-10-CM | POA: Diagnosis not present

## 2023-10-05 ENCOUNTER — Encounter: Payer: Self-pay | Admitting: Registered Nurse

## 2023-10-05 ENCOUNTER — Encounter: Attending: Registered Nurse | Admitting: Registered Nurse

## 2023-10-05 VITALS — BP 151/72 | HR 69

## 2023-10-05 DIAGNOSIS — Z5181 Encounter for therapeutic drug level monitoring: Secondary | ICD-10-CM | POA: Insufficient documentation

## 2023-10-05 DIAGNOSIS — M546 Pain in thoracic spine: Secondary | ICD-10-CM | POA: Diagnosis not present

## 2023-10-05 DIAGNOSIS — G894 Chronic pain syndrome: Secondary | ICD-10-CM | POA: Insufficient documentation

## 2023-10-05 DIAGNOSIS — M25511 Pain in right shoulder: Secondary | ICD-10-CM | POA: Insufficient documentation

## 2023-10-05 DIAGNOSIS — M545 Low back pain, unspecified: Secondary | ICD-10-CM | POA: Diagnosis not present

## 2023-10-05 DIAGNOSIS — G8929 Other chronic pain: Secondary | ICD-10-CM | POA: Insufficient documentation

## 2023-10-05 DIAGNOSIS — Z79899 Other long term (current) drug therapy: Secondary | ICD-10-CM | POA: Diagnosis not present

## 2023-10-05 DIAGNOSIS — M25512 Pain in left shoulder: Secondary | ICD-10-CM | POA: Insufficient documentation

## 2023-10-05 MED ORDER — HYDROCODONE-ACETAMINOPHEN 5-325 MG PO TABS: 1.0000 | ORAL_TABLET | Freq: Two times a day (BID) | ORAL | 0 refills | Status: DC | PRN

## 2023-10-05 NOTE — Progress Notes (Signed)
 Subjective:    Patient ID: Holly Jimenez, female    DOB: [birth year], 78 y.o.   MRN: [id number]  HPI: Holly Jimenez is a 78 y.o. female who returns for follow up appointment for chronic pain and medication refill. She states her pain is located in her bilateral shoulders L>R and mid- lower back pain, She rates her pain 4. Her current exercise regime is walking and performing stretching exercises.  [name] Morphine  equivalent is 10.00 MME.   Last Oral Swab was Performed 07/31/2023, it was inconsistent for medication. [name] reports she is complkiant with her medication.    Pain Inventory Average Pain 4 Pain Right Now 4 My pain is sharp and aching  In the last 24 hours, has pain interfered with the following? General activity 5 Relation with others 5 Enjoyment of life 5 What TIME of day is your pain at its worst? daytime Sleep (in general) Good  Pain is worse with: standing and some activites Pain improves with: heat/ice, therapy/exercise, and medication Relief from Meds: 4  Family History  Problem Relation Age of Onset   Ovarian cancer Sister    Colon cancer Sister    Heart disease Paternal Grandfather    Heart disease Maternal Grandfather    Kidney disease Brother    Kidney disease Mother    Hypertension Mother    Kidney disease Maternal Grandmother    Diabetes Paternal Grandmother    Asthma Father    Suicidality Father    Alcoholism Father    Social History   Socioeconomic History   Marital status: Divorced    Spouse name: Not on file   Number of children: 1   Years of education: Associates   Highest education level: Not on file  Occupational History   Occupation: retired    Associate Professor: [employer]  Tobacco Use   Smoking status: Former    Current packs/day: 0.00    Average packs/day: 1 pack/day for 25.0 years (25.0 ttl pk-yrs)    Types: Cigarettes    Start date: 03/06/1975    Quit date: 03/05/2000    Years  since quitting: 23.6   Smokeless tobacco: Never  Vaping Use   Vaping status: Never Used  Substance and Sexual Activity   Alcohol use: Yes    Comment: Rare   Drug use: No    Frequency: 2.0 times per week    Types: Marijuana    Comment: previous use of marijuana for pain   Sexual activity: Not on file  Other Topics Concern   Not on file  Social History Narrative   Lives at home alone.   Right-handed.   Rare use of caffeine.   Social Drivers of Corporate investment banker Strain: Not on file  Food Insecurity: No Food Insecurity (02/19/2023)   Hunger Vital Sign    Worried About Running Out of Food in the Last Year: Never true    Ran Out of Food in the Last Year: Never true  Transportation Needs: No Transportation Needs (02/19/2023)   PRAPARE - Administrator, Civil Service (Medical): No    Lack of Transportation (Non-Medical): No  Physical Activity: Not on file  Stress: Not on file  Social Connections: Moderately Isolated (02/19/2023)   Social Connection and Isolation Panel    Frequency of Communication with Friends and Family: More than three times a week    Frequency of Social Gatherings with Friends and Family: More than three times a week  Attends Religious Services: More than 4 times per year    Active Member of Clubs or Organizations: No    Attends Banker Meetings: Never    Marital Status: Widowed   Past Surgical History:  Procedure Laterality Date   ABDOMINAL HYSTERECTOMY  1975   APPENDECTOMY  1972   FOOT SURGERY     LEFT   KYPHOPLASTY Bilateral 03/08/2014   Procedure: Thoracic nine Kyphoplasty;  Surgeon: Rockey Peru, MD;  Location: [facility];  Service: Neurosurgery;  Laterality: Bilateral;  Thoracic nine Kyphoplasty   LIPOMA EXCISION     back   TONSILLECTOMY     TOTAL HIP ARTHROPLASTY Left 12/26/2022   Procedure: TOTAL HIP ARTHROPLASTY ANTERIOR APPROACH;  Surgeon: Vernetta Lonni GRADE, MD;  Location: [facility];  Service: Orthopedics;   Laterality: Left;   TOTAL HIP ARTHROPLASTY Right 02/19/2023   Procedure: TOTAL HIP ARTHROPLASTY ANTERIOR APPROACH;  Surgeon: Vernetta Lonni GRADE, MD;  Location: [facility];  Service: Orthopedics;  Laterality: Right;   WRIST GANGLION EXCISION     left   Past Surgical History:  Procedure Laterality Date   ABDOMINAL HYSTERECTOMY  1975   APPENDECTOMY  1972   FOOT SURGERY     LEFT   KYPHOPLASTY Bilateral 03/08/2014   Procedure: Thoracic nine Kyphoplasty;  Surgeon: Rockey Peru, MD;  Location: [facility];  Service: Neurosurgery;  Laterality: Bilateral;  Thoracic nine Kyphoplasty   LIPOMA EXCISION     back   TONSILLECTOMY     TOTAL HIP ARTHROPLASTY Left 12/26/2022   Procedure: TOTAL HIP ARTHROPLASTY ANTERIOR APPROACH;  Surgeon: Vernetta Lonni GRADE, MD;  [facility]: [facility];  Service: Orthopedics;  Laterality: Left;   TOTAL HIP ARTHROPLASTY Right 02/19/2023   Procedure: TOTAL HIP ARTHROPLASTY ANTERIOR APPROACH;  Surgeon: Vernetta Lonni GRADE, MD;  Location: [facility];  Service: Orthopedics;  Laterality: Right;   WRIST GANGLION EXCISION     left   Past Medical History:  Diagnosis Date   Abnormal LFTs    Allergic rhinitis    Anemia    Anxiety    Arthritis    Chronic back pain    Chronic headaches    Chronic kidney disease, unspecified    DDD (degenerative disc disease)    GERD (gastroesophageal reflux disease)    Headache    Hypercalcemia    Hyperkalemia    Hyperlipidemia    Hypertension    KIDNEY SPECIALIST TOOK OFF BENICAR   IBS (irritable bowel syndrome)    Lupus    Mixed connective tissue disease    Nausea    Neuropathy    Peptic ulcer disease    Raynaud's disease    Small kidney    left   Vitamin D  deficiency disease    BP (!) 142/72 (BP Location: Left Arm, Patient Position: Sitting, Cuff Size: Normal)   Pulse 69   SpO2 95%   Opioid Risk Score:   Fall Risk Score:  `1  Depression screen [facility] 2/9     07/31/2023   11:22 AM 06/03/2023   11:19 AM 10/23/2022   11:36 AM  08/14/2022   10:24 AM 06/12/2022   11:36 AM 05/20/2022   11:09 AM 05/13/2022   11:44 AM  Depression screen PHQ 2/9  Decreased Interest 0 0 0 0 0 0 0  Down, Depressed, Hopeless 0 0 0 0 0 0 0  PHQ - 2 Score 0 0 0 0 0 0 0      Review of Systems  Musculoskeletal:  Positive for arthralgias  and back pain.       Bilateral shoulder pain, right rotator cuff tear, low back pain  All other systems reviewed and are negative.      Objective:   Physical Exam Vitals and nursing note reviewed.  Constitutional:      Appearance: Normal appearance.  Cardiovascular:     Rate and Rhythm: Normal rate and regular rhythm.     Pulses: Normal pulses.     Heart sounds: Normal heart sounds.  Pulmonary:     Effort: Pulmonary effort is normal.     Breath sounds: Normal breath sounds.  Musculoskeletal:     Comments: Normal Muscle Bulk and Muscle Testing Reveals:  Upper Extremities: Decreased ROM 45 Degrees  and Muscle Strength 5/5 Left AC Joint Tenderness  Lower Extremities: Full ROM and Muscle Strength 5/5 Arrive in wheelchair     Skin:    General: Skin is warm and dry.  Neurological:     Mental Status: She is alert and oriented to person, place, and time.  Psychiatric:        Mood and Affect: Mood normal.        Behavior: Behavior normal.          Assessment & Plan:  Cervicalgia/ Cervical Radiculitis: No complaints today. Continue HEP as Tolerated. Continue to Monitor. 10/05/2023 Left Shoulder Pain : Continue HEP as Tolerated. Continue to Monitor. 10/05/2023 Lumbar Spinal Stenosis: . Continue HEP as Tolerated . Continue to monitor. 10/05/2023 Chronic Low Back Pain without sciatica:. Continue HEP as Tolerated. Continue current medication regimen. 10/05/2023 Intercostal Neuralgia:  No complaints today. Continue Lyrica . Continue to Monitor.10/05/2023 Chronic Pain Syndrome: Continue Hydrocodone  5mg /325 one tablet twice a day  as needed for pain #60.  Second script sent or the ollowing month.  We  will continue the opioid monitoring program, this consists of regular clinic visits, examinations, urine drug screen, pill counts as well as use of [place]  Controlled Substance Reporting system. A 12 month History has been reviewed on the [place]  Controlled Substance Reporting System on 09/29/20245 8. Polyarthralgia: Continue HEP as Tolerated and Continue to Monitor. 10/01/2023 10. Right Hip Pain: S/P : S/P on 02/19/2023: Dr Vernetta. Ortho Following  TOTAL HIP ARTHROPLASTY ANTERIOR APPROACH Right  11. Left Hip Pain: S/P: S/P on 12/26/2022: Dr Vernetta: Ortho Following.  TOTAL HIP ARTHROPLASTY ANTERIOR APPROACH Left General  12. Essential Hypertension: Medication list Reviewed. Blood Pressure re-checked, she refused ED or Urgent Care Evaluation. Ms. Janett- Peace states she will  follow up with her PCP. She will keep a blood Pressure Log.    F/U in 2 months:

## 2023-11-09 ENCOUNTER — Encounter: Payer: Self-pay | Admitting: Radiology

## 2023-11-16 ENCOUNTER — Telehealth: Payer: Self-pay | Admitting: *Deleted

## 2023-11-16 NOTE — Telephone Encounter (Signed)
 Holly Jimenez asked if she would change her appt in December to January, and yes she will do that.

## 2023-11-18 ENCOUNTER — Telehealth: Payer: Self-pay | Admitting: Registered Nurse

## 2023-11-18 DIAGNOSIS — G8929 Other chronic pain: Secondary | ICD-10-CM

## 2023-11-18 DIAGNOSIS — G894 Chronic pain syndrome: Secondary | ICD-10-CM

## 2023-11-18 MED ORDER — HYDROCODONE-ACETAMINOPHEN 5-325 MG PO TABS: 1.0000 | ORAL_TABLET | Freq: Two times a day (BID) | ORAL | 0 refills | Status: DC | PRN

## 2023-11-18 NOTE — Telephone Encounter (Signed)
 PDMP Reviewed,  Hydrocodone  e-scribed for December.  Holly Jimenez was called and is aware of the above and verbalizes understanding.

## 2023-11-20 DIAGNOSIS — N2581 Secondary hyperparathyroidism of renal origin: Secondary | ICD-10-CM | POA: Diagnosis not present

## 2023-11-20 DIAGNOSIS — N189 Chronic kidney disease, unspecified: Secondary | ICD-10-CM | POA: Diagnosis not present

## 2023-11-20 DIAGNOSIS — N179 Acute kidney failure, unspecified: Secondary | ICD-10-CM | POA: Diagnosis not present

## 2023-11-20 DIAGNOSIS — D631 Anemia in chronic kidney disease: Secondary | ICD-10-CM | POA: Diagnosis not present

## 2023-11-20 DIAGNOSIS — I129 Hypertensive chronic kidney disease with stage 1 through stage 4 chronic kidney disease, or unspecified chronic kidney disease: Secondary | ICD-10-CM | POA: Diagnosis not present

## 2023-11-20 DIAGNOSIS — N184 Chronic kidney disease, stage 4 (severe): Secondary | ICD-10-CM | POA: Diagnosis not present

## 2023-11-20 DIAGNOSIS — E875 Hyperkalemia: Secondary | ICD-10-CM | POA: Diagnosis not present

## 2023-12-07 ENCOUNTER — Encounter: Admitting: Registered Nurse

## 2024-01-04 ENCOUNTER — Encounter: Attending: Registered Nurse | Admitting: Registered Nurse

## 2024-01-04 ENCOUNTER — Encounter: Payer: Self-pay | Admitting: Registered Nurse

## 2024-01-04 VITALS — BP 145/69 | HR 71 | Ht 61.0 in | Wt 95.2 lb

## 2024-01-04 DIAGNOSIS — G894 Chronic pain syndrome: Secondary | ICD-10-CM | POA: Diagnosis present

## 2024-01-04 DIAGNOSIS — M25512 Pain in left shoulder: Secondary | ICD-10-CM | POA: Insufficient documentation

## 2024-01-04 DIAGNOSIS — M2550 Pain in unspecified joint: Secondary | ICD-10-CM | POA: Insufficient documentation

## 2024-01-04 DIAGNOSIS — M25511 Pain in right shoulder: Secondary | ICD-10-CM | POA: Insufficient documentation

## 2024-01-04 DIAGNOSIS — G8929 Other chronic pain: Secondary | ICD-10-CM | POA: Insufficient documentation

## 2024-01-04 DIAGNOSIS — Z79899 Other long term (current) drug therapy: Secondary | ICD-10-CM | POA: Diagnosis present

## 2024-01-04 DIAGNOSIS — Z5181 Encounter for therapeutic drug level monitoring: Secondary | ICD-10-CM | POA: Insufficient documentation

## 2024-01-04 DIAGNOSIS — M545 Low back pain, unspecified: Secondary | ICD-10-CM | POA: Diagnosis present

## 2024-01-04 DIAGNOSIS — M5412 Radiculopathy, cervical region: Secondary | ICD-10-CM | POA: Diagnosis present

## 2024-01-04 DIAGNOSIS — M546 Pain in thoracic spine: Secondary | ICD-10-CM | POA: Insufficient documentation

## 2024-01-04 DIAGNOSIS — M542 Cervicalgia: Secondary | ICD-10-CM | POA: Insufficient documentation

## 2024-01-04 MED ORDER — HYDROCODONE-ACETAMINOPHEN 5-325 MG PO TABS: 1.0000 | ORAL_TABLET | Freq: Two times a day (BID) | ORAL | 0 refills | Status: AC | PRN

## 2024-01-04 MED ORDER — HYDROCODONE-ACETAMINOPHEN 5-325 MG PO TABS: 1.0000 | ORAL_TABLET | Freq: Two times a day (BID) | ORAL | 0 refills | Status: DC | PRN

## 2024-01-04 NOTE — Progress Notes (Signed)
 "  Subjective:    Patient ID: Holly Jimenez, female    DOB: 1945/11/29, 78 y.o.   MRN: [id number]  HPI: Holly Jimenez is a 78 y.o. female who returns for follow up appointment for chronic pain and medication refill. She states her pain is located in her neck radiating into her bilateral shoulders L>R, also reports increase intensity of cervical radicular pain over the last two months, She didn't seek medical attention, she denies falling. Also reports mid- lower back pain. She rates her pain 8. Her current exercise regime is walking short distances with cane or walker and performing stretching exercises.  Holly Jimenez Morphine  equivalent is 10.00 MME.   Oral Swab was Performed today.     Pain Inventory Average Pain 5 Pain Right Now 8 My pain is sharp and aching  In the last 24 hours, has pain interfered with the following? General activity 9 Relation with others 9 Enjoyment of life 9 What TIME of day is your pain at its worst? daytime, evening, and night Sleep (in general) Good  Pain is worse with: inactivity and standing Pain improves with: heat/ice and medication Relief from Meds: 4  Family History  Problem Relation Age of Onset   Ovarian cancer Sister    Colon cancer Sister    Heart disease Paternal Grandfather    Heart disease Maternal Grandfather    Kidney disease Brother    Kidney disease Mother    Hypertension Mother    Kidney disease Maternal Grandmother    Diabetes Paternal Grandmother    Asthma Father    Suicidality Father    Alcoholism Father    Social History   Socioeconomic History   Marital status: Divorced    Spouse name: Not on file   Number of children: 1   Years of education: Associates   Highest education level: Not on file  Occupational History   Occupation: retired    Associate Professor: [employer]  Tobacco Use   Smoking status: Former    Current packs/day: 0.00    Average packs/day: 1 pack/day for 25.0 years (25.0  ttl pk-yrs)    Types: Cigarettes    Start date: 03/06/1975    Quit date: 03/05/2000    Years since quitting: 23.8   Smokeless tobacco: Never  Vaping Use   Vaping status: Never Used  Substance and Sexual Activity   Alcohol use: Yes    Comment: Rare   Drug use: No    Frequency: 2.0 times per week    Types: Marijuana    Comment: previous use of marijuana for pain   Sexual activity: Not on file  Other Topics Concern   Not on file  Social History Narrative   Lives at home alone.   Right-handed.   Rare use of caffeine.   Social Drivers of Health   Tobacco Use: Medium Risk (01/04/2024)   Patient History    Smoking Tobacco Use: Former    Smokeless Tobacco Use: Never    Passive Exposure: Not on file  Financial Resource Strain: Not on file  Food Insecurity: No Food Insecurity (02/19/2023)   Hunger Vital Sign    Worried About Running Out of Food in the Last Year: Never true    Ran Out of Food in the Last Year: Never true  Transportation Needs: No Transportation Needs (02/19/2023)   PRAPARE - Administrator, Civil Service (Medical): No    Lack of Transportation (Non-Medical): No  Physical Activity: Not on file  Stress: Not on file  Social Connections: Moderately Isolated (02/19/2023)   Social Connection and Isolation Panel    Frequency of Communication with Friends and Family: More than three times a week    Frequency of Social Gatherings with Friends and Family: More than three times a week    Attends Religious Services: More than 4 times per year    Active Member of [employer] or Organizations: No    Attends Banker Meetings: Never    Marital Status: Widowed  Depression (PHQ2-9): Low Risk (07/31/2023)   Depression (PHQ2-9)    PHQ-2 Score: 0  Alcohol Screen: Not on file  Housing: Low Risk (02/19/2023)   Housing Stability Vital Sign    Unable to Pay for Housing in the Last Year: No    Number of Times Moved in the Last Year: 0    Homeless in the Last Year: No   Utilities: Not At Risk (02/19/2023)   AHC Utilities    Threatened with loss of utilities: No  Health Literacy: Not on file   Past Surgical History:  Procedure Laterality Date   ABDOMINAL HYSTERECTOMY  1975   APPENDECTOMY  1972   FOOT SURGERY     LEFT   KYPHOPLASTY Bilateral 03/08/2014   Procedure: Thoracic nine Kyphoplasty;  Surgeon: Rockey Peru, MD;  Location: [facility];  Service: Neurosurgery;  Laterality: Bilateral;  Thoracic nine Kyphoplasty   LIPOMA EXCISION     back   TONSILLECTOMY     TOTAL HIP ARTHROPLASTY Left 12/26/2022   Procedure: TOTAL HIP ARTHROPLASTY ANTERIOR APPROACH;  Surgeon: Vernetta Lonni GRADE, MD;  Location: [facility];  Service: Orthopedics;  Laterality: Left;   TOTAL HIP ARTHROPLASTY Right 02/19/2023   Procedure: TOTAL HIP ARTHROPLASTY ANTERIOR APPROACH;  Surgeon: Vernetta Lonni GRADE, MD;  Location: [facility];  Service: Orthopedics;  Laterality: Right;   WRIST GANGLION EXCISION     left   Past Surgical History:  Procedure Laterality Date   ABDOMINAL HYSTERECTOMY  1975   APPENDECTOMY  1972   FOOT SURGERY     LEFT   KYPHOPLASTY Bilateral 03/08/2014   Procedure: Thoracic nine Kyphoplasty;  Surgeon: Rockey Peru, MD;  Location: [facility];  Service: Neurosurgery;  Laterality: Bilateral;  Thoracic nine Kyphoplasty   LIPOMA EXCISION     back   TONSILLECTOMY     TOTAL HIP ARTHROPLASTY Left 12/26/2022   Procedure: TOTAL HIP ARTHROPLASTY ANTERIOR APPROACH;  Surgeon: Vernetta Lonni GRADE, MD;  Location: [facility];  Service: Orthopedics;  Laterality: Left;   TOTAL HIP ARTHROPLASTY Right 02/19/2023   Procedure: TOTAL HIP ARTHROPLASTY ANTERIOR APPROACH;  Surgeon: Vernetta Lonni GRADE, MD;  Location: [facility];  Service: Orthopedics;  Laterality: Right;   WRIST GANGLION EXCISION     left   Past Medical History:  Diagnosis Date   Abnormal LFTs    Allergic rhinitis    Anemia    Anxiety    Arthritis    Chronic back pain    Chronic headaches    Chronic kidney  disease, unspecified    DDD (degenerative disc disease)    GERD (gastroesophageal reflux disease)    Headache    Hypercalcemia    Hyperkalemia    Hyperlipidemia    Hypertension    KIDNEY SPECIALIST TOOK OFF BENICAR   IBS (irritable bowel syndrome)    Lupus    Mixed connective tissue disease    Nausea    Neuropathy    Peptic ulcer disease    Raynaud's disease  Small kidney    left   Vitamin D  deficiency disease    BP (!) 145/69   Pulse 71   Ht 5' 1 (1.549 m)   Wt 95 lb 3.2 oz (43.2 kg)   SpO2 96%   BMI 17.99 kg/m   Opioid Risk Score:   Fall Risk Score:  `1  Depression screen PHQ 2/9     01/04/2024    1:16 PM 07/31/2023   11:22 AM 06/03/2023   11:19 AM 10/23/2022   11:36 AM 08/14/2022   10:24 AM 06/12/2022   11:36 AM 05/20/2022   11:09 AM  Depression screen PHQ 2/9  Decreased Interest 0 0 0 0 0 0 0  Down, Depressed, Hopeless 0 0 0 0 0 0 0  PHQ - 2 Score 0 0 0 0 0 0 0     Review of Systems  Musculoskeletal:  Positive for back pain and neck pain.  All other systems reviewed and are negative.      Objective:   Physical Exam Vitals and nursing note reviewed.  Constitutional:      Appearance: Normal appearance.  Neck:     Comments: Cervical Paraspinal Tenderness: C-4-C-6 Cardiovascular:     Rate and Rhythm: Normal rate and regular rhythm.     Pulses: Normal pulses.     Heart sounds: Normal heart sounds.  Pulmonary:     Effort: Pulmonary effort is normal.     Breath sounds: Normal breath sounds.  Musculoskeletal:     Comments: Normal Muscle Bulk and Muscle Testing Reveals:  Upper Extremities: Decreased ROM  90 Degrees and Muscle Strength  5/5 Left AC Joint Tenderness Lower Extremities: Full ROM and Muscle Strength 5/5 Arrived in wheelchair     Skin:    General: Skin is warm and dry.  Neurological:     Mental Status: She is alert and oriented to person, place, and time.  Psychiatric:        Mood and Affect: Mood normal.        Behavior: Behavior  normal.          Assessment & Plan:  Cervicalgia/ Cervical Radiculitis: Dr Lorilee on vacation will speak with covering provider regarding ordering a MRI, she verbalizes understanding.  Continue HEP as Tolerated. Continue to Monitor. 01/04/2024 Bilateral Shoulder Pain : Continue HEP as Tolerated. Continue to Monitor. 01/04/2024 Lumbar Spinal Stenosis: . Continue HEP as Tolerated . Continue to monitor. 01/04/2024 Chronic Low Back Pain without sciatica:. Continue HEP as Tolerated. Continue current medication regimen. 01/04/2024 Intercostal Neuralgia:  No complaints today. Continue Lyrica . Continue to Monitor.01/04/2024 Chronic Pain Syndrome: Continue Hydrocodone  5mg /325 one tablet twice a day  as needed for pain #60.  Second script sent or the ollowing month.  We will continue the opioid monitoring program, this consists of regular clinic visits, examinations, urine drug screen, pill counts as well as use of [place]  Controlled Substance Reporting system. A 12 month History has been reviewed on the [place]  Controlled Substance Reporting System on 12/29/20245 8. Polyarthralgia: Continue HEP as Tolerated and Continue to Monitor. 01/04/2024 10. Right Hip Pain: S/P : S/P on 02/19/2023: Dr Vernetta. Ortho Following  TOTAL HIP ARTHROPLASTY ANTERIOR APPROACH Right  11. Left Hip Pain: S/P: S/P on 12/26/2022: Dr Vernetta: Ortho Following.  TOTAL HIP ARTHROPLASTY ANTERIOR APPROACH Left General   F/U in 3 months:     "

## 2024-01-08 ENCOUNTER — Telehealth: Payer: Self-pay | Admitting: Registered Nurse

## 2024-01-08 DIAGNOSIS — M542 Cervicalgia: Secondary | ICD-10-CM

## 2024-01-08 DIAGNOSIS — M5412 Radiculopathy, cervical region: Secondary | ICD-10-CM

## 2024-01-08 NOTE — Telephone Encounter (Signed)
 Swab result data will go through review. Await result.

## 2024-01-08 NOTE — Telephone Encounter (Signed)
 I placed call to [facility] and spoke with Rosina. She will escalate the request to the medical advisers to review. They will call when reviewed.

## 2024-01-08 NOTE — Telephone Encounter (Signed)
 Spoke with Dr Cornelio regarding [name] increase intensity and frequency of Cervical Radicular pain. She agrees with  Cervical Mr.  Call placed to [name]  regarding the above.  We discussed her oral swab results, she states she does not know where the Fentanyl  came from, she hasn't taken any medication from anyone.  Will ask Sybil to speak to the toxicologist regarding the above and will let Dr Lorilee know as well, she verbalizes understanding. She is aware she could be discharged from our office. She is adamant she didn't take any medication from anyone.

## 2024-01-12 LAB — DRUG TOX MONITOR 1 W/CONF, ORAL FLD
Amphetamines: NEGATIVE ng/mL
Barbiturates: NEGATIVE ng/mL
Benzodiazepines: NEGATIVE ng/mL
Buprenorphine: NEGATIVE ng/mL
Cocaine: NEGATIVE ng/mL
Fentanyl: 1.45 ng/mL — ABNORMAL HIGH
Fentanyl: POSITIVE ng/mL — AB
Heroin Metabolite: NEGATIVE ng/mL
MARIJUANA: NEGATIVE ng/mL
MDMA: NEGATIVE ng/mL
Meprobamate: NEGATIVE ng/mL
Methadone: NEGATIVE ng/mL
Nicotine Metabolite: NEGATIVE ng/mL
Opiates: NEGATIVE ng/mL
Phencyclidine: NEGATIVE ng/mL
Tapentadol: NEGATIVE ng/mL
Tramadol: NEGATIVE ng/mL
Zolpidem: NEGATIVE ng/mL

## 2024-01-12 LAB — DRUG TOX ALC METAB W/CON, ORAL FLD: Alcohol Metabolite: NEGATIVE ng/mL

## 2024-01-14 NOTE — Telephone Encounter (Signed)
 Dr Damien Orleans from [facility] called back. Patient is cleared. They were able to run the swab again and it was negative. They determined most likely spill over from previous swab run.

## 2024-01-15 ENCOUNTER — Telehealth: Payer: Self-pay | Admitting: Registered Nurse

## 2024-01-15 NOTE — Telephone Encounter (Signed)
 [name] returned your phone call.

## 2024-01-15 NOTE — Telephone Encounter (Signed)
 S Peace was called regarding her oral swab, no answer. Left message to return the call.

## 2024-01-15 NOTE — Telephone Encounter (Signed)
 Return Holly Jimenez  call,  She is aware of Toxicologist report, she verbalizes understanding.

## 2024-01-20 ENCOUNTER — Ambulatory Visit
Admission: RE | Admit: 2024-01-20 | Discharge: 2024-01-20 | Disposition: A | Discharge status: Home / Self Care | Source: Ambulatory Visit | Attending: Registered Nurse | Admitting: Registered Nurse

## 2024-01-25 ENCOUNTER — Telehealth: Payer: Self-pay | Admitting: Registered Nurse

## 2024-01-25 DIAGNOSIS — M5412 Radiculopathy, cervical region: Secondary | ICD-10-CM

## 2024-01-25 DIAGNOSIS — M542 Cervicalgia: Secondary | ICD-10-CM

## 2024-01-25 NOTE — Telephone Encounter (Signed)
 Call placed to [name], regarding Cervical MR Results Dr Lorilee recommendation: Referral to Neurosurgery: Dr Joshua. Referral Placed.  Call placed to [name] regarding the above, she is in agreement and verbalizes understanding.

## 2024-02-03 ENCOUNTER — Telehealth: Payer: Self-pay | Admitting: *Deleted

## 2024-02-03 NOTE — Telephone Encounter (Signed)
 Return [name] call. Her referral was faced on 01/28/2024.  [name] was given Dr Joshua office number, she will call. If no answer, she was instructed to call this provide, she verbalizes understanding.

## 2024-02-03 NOTE — Telephone Encounter (Signed)
 Holly Jimenez called to speak with [name]. She has not heard from the referral that was made.

## 2024-03-08 ENCOUNTER — Encounter: Admitting: Registered Nurse
